# Patient Record
Sex: Female | Born: 1935 | Race: White | Hispanic: No | Smoking: Never smoker
Health system: Southern US, Community
[De-identification: ages and names within clinical notes are randomized; demographics above are authoritative.]

## PROBLEM LIST (undated history)

## (undated) DIAGNOSIS — I10 Essential (primary) hypertension: Secondary | ICD-10-CM

## (undated) DIAGNOSIS — E785 Hyperlipidemia, unspecified: Secondary | ICD-10-CM

## (undated) DIAGNOSIS — I4891 Unspecified atrial fibrillation: Secondary | ICD-10-CM

## (undated) DIAGNOSIS — E559 Vitamin D deficiency, unspecified: Secondary | ICD-10-CM

## (undated) DIAGNOSIS — K219 Gastro-esophageal reflux disease without esophagitis: Secondary | ICD-10-CM

## (undated) DIAGNOSIS — N189 Chronic kidney disease, unspecified: Secondary | ICD-10-CM

## (undated) DIAGNOSIS — F32A Depression, unspecified: Secondary | ICD-10-CM

## (undated) DIAGNOSIS — F329 Major depressive disorder, single episode, unspecified: Secondary | ICD-10-CM

## (undated) DIAGNOSIS — F0390 Unspecified dementia without behavioral disturbance: Secondary | ICD-10-CM

## (undated) HISTORY — DX: Vitamin D deficiency, unspecified: E55.9

## (undated) HISTORY — DX: Hyperlipidemia, unspecified: E78.5

## (undated) HISTORY — DX: Chronic kidney disease, unspecified: N18.9

## (undated) HISTORY — DX: Depression, unspecified: F32.A

## (undated) HISTORY — DX: Unspecified atrial fibrillation: I48.91

## (undated) HISTORY — PX: KIDNEY DONATION: SHX685

## (undated) HISTORY — DX: Major depressive disorder, single episode, unspecified: F32.9

## (undated) HISTORY — PX: CATARACT EXTRACTION: SUR2

---

## 2008-09-25 ENCOUNTER — Inpatient Hospital Stay: Payer: Self-pay | Admitting: Internal Medicine

## 2009-01-24 ENCOUNTER — Emergency Department: Payer: Self-pay | Admitting: Emergency Medicine

## 2009-05-25 ENCOUNTER — Ambulatory Visit: Payer: Self-pay | Admitting: Cardiovascular Disease

## 2009-12-06 ENCOUNTER — Emergency Department: Payer: Self-pay | Admitting: Emergency Medicine

## 2010-05-02 ENCOUNTER — Emergency Department: Payer: Self-pay | Admitting: Emergency Medicine

## 2010-07-31 ENCOUNTER — Ambulatory Visit: Payer: Self-pay | Admitting: Ophthalmology

## 2010-07-31 DIAGNOSIS — I119 Hypertensive heart disease without heart failure: Secondary | ICD-10-CM

## 2010-08-12 ENCOUNTER — Ambulatory Visit: Payer: Self-pay | Admitting: Ophthalmology

## 2010-08-13 ENCOUNTER — Ambulatory Visit: Payer: Self-pay | Admitting: Ophthalmology

## 2010-12-12 ENCOUNTER — Ambulatory Visit: Payer: Self-pay | Admitting: Ophthalmology

## 2011-01-17 ENCOUNTER — Ambulatory Visit: Payer: Self-pay | Admitting: Ophthalmology

## 2011-06-03 ENCOUNTER — Ambulatory Visit: Payer: Self-pay | Admitting: Ophthalmology

## 2013-06-03 ENCOUNTER — Ambulatory Visit: Payer: Self-pay | Admitting: Family

## 2013-08-01 ENCOUNTER — Ambulatory Visit: Payer: Self-pay | Admitting: Family

## 2014-08-13 NOTE — Op Note (Signed)
PATIENT NAME:  Rebecca HarriesDAYE, BERTIE B MR#:  [id number] DATE OF BIRTH:  06-02-1928  DATE OF PROCEDURE:  06/03/2011  PREOPERATIVE DIAGNOSIS: Visually significant cataract of the left eye.   POSTOPERATIVE DIAGNOSIS: Visually significant cataract of the left eye.   OPERATIVE PROCEDURE: Cataract extraction by phacoemulsification with implant of intraocular lens to the left eye.   SURGEON: Galen ManilaWilliam Jaice Lague, MD  ANESTHESIA:  1. Managed anesthesia care.  2. 50-50 mixture of 0.75% bupivacaine and 4% Xylocaine given as a retrobulbar block.   COMPLICATIONS: None.   TECHNIQUE:  Four quadrant divide-and-conquer.   DESCRIPTION OF PROCEDURE: The patient was examined and consented for this procedure in the preoperative holding area and then brought back to the Operating Room where the anesthesia team employed managed anesthesia care.  3.5 milliliters of the aforementioned mixture were placed in the left orbit on an Atkinson needle without complication. The left eye was then prepped and draped in the usual sterile ophthalmic fashion. A lid speculum was placed. The side-port blade was used to create a paracentesis and the anterior chamber was filled with viscoelastic. The keratome was used to create a near clear corneal incision. The continuous curvilinear capsulorrhexis was performed with a cystotome followed by the capsulorrhexis forceps. Hydrodissection and hydrodelineation were carried out with BSS on a blunt cannula. The lens was removed in a four quadrant divide-and-conquer technique. The remaining cortical material was removed with the irrigation-aspiration handpiece. The capsular bag was inflated with viscoelastic and the Alcon SN60WF 22.5-diopter lens, serial number [id number] was placed in the capsular bag without complication. The remaining viscoelastic was removed from the eye with the irrigation-aspiration handpiece. The wounds were hydrated. The anterior chamber was flushed with Miostat. The eye was inflated  to a physiologic pressure and the wounds were found to be water tight. The eye was dressed with Vigamox followed by Maxitrol ointment and a protective shield was placed. The patient will followup with me in one day.   NOTE: The Earl GalaMilligan ring was used to expand the pupil for adequate visualization during this procedure. The ChokioMilligan ring was removed at the end of the case.   ____________________________ Jerilee FieldWilliam L. Ned Kakar, MD wlp:cms D: 06/03/2011 21:52:21 ET T: 06/04/2011 09:19:23 ET JOB#: [id number]  cc: Lisaanne Lawrie L. Harold Moncus, MD, <Dictator>  Jerilee FieldWILLIAM L Shadaya Marschner MD ELECTRONICALLY SIGNED 06/10/2011 12:26

## 2014-12-19 ENCOUNTER — Emergency Department
Admission: EM | Admit: 2014-12-19 | Discharge: 2014-12-19 | Discharge status: Against Medical Advice | Payer: Medicare (Managed Care) | Attending: Emergency Medicine | Admitting: Emergency Medicine

## 2014-12-19 ENCOUNTER — Encounter: Payer: Self-pay | Admitting: Emergency Medicine

## 2014-12-19 DIAGNOSIS — I10 Essential (primary) hypertension: Secondary | ICD-10-CM | POA: Diagnosis not present

## 2014-12-19 DIAGNOSIS — R0602 Shortness of breath: Secondary | ICD-10-CM | POA: Insufficient documentation

## 2014-12-19 HISTORY — DX: Essential (primary) hypertension: I10

## 2014-12-19 HISTORY — DX: Gastro-esophageal reflux disease without esophagitis: K21.9

## 2014-12-19 HISTORY — DX: Unspecified dementia, unspecified severity, without behavioral disturbance, psychotic disturbance, mood disturbance, and anxiety: F03.90

## 2014-12-19 NOTE — ED Notes (Signed)
Pt brought in by granddaughter with c/o  Shortness of breath  For a couple of months.

## 2014-12-22 ENCOUNTER — Telehealth: Payer: Self-pay | Admitting: Emergency Medicine

## 2014-12-22 NOTE — ED Notes (Signed)
Called patient due to lwot to inquire about condition and follow up plans. Left message with my number. 

## 2015-02-18 ENCOUNTER — Observation Stay
Admission: EM | Admit: 2015-02-18 | Discharge: 2015-02-19 | Disposition: A | Discharge status: Home / Self Care | Payer: Medicare (Managed Care) | Attending: Internal Medicine | Admitting: Internal Medicine

## 2015-02-18 ENCOUNTER — Emergency Department: Payer: Medicare (Managed Care)

## 2015-02-18 DIAGNOSIS — Z905 Acquired absence of kidney: Secondary | ICD-10-CM | POA: Diagnosis not present

## 2015-02-18 DIAGNOSIS — Z9849 Cataract extraction status, unspecified eye: Secondary | ICD-10-CM | POA: Insufficient documentation

## 2015-02-18 DIAGNOSIS — Z888 Allergy status to other drugs, medicaments and biological substances status: Secondary | ICD-10-CM | POA: Diagnosis not present

## 2015-02-18 DIAGNOSIS — Z79899 Other long term (current) drug therapy: Secondary | ICD-10-CM | POA: Insufficient documentation

## 2015-02-18 DIAGNOSIS — R002 Palpitations: Secondary | ICD-10-CM | POA: Diagnosis not present

## 2015-02-18 DIAGNOSIS — I4891 Unspecified atrial fibrillation: Principal | ICD-10-CM

## 2015-02-18 DIAGNOSIS — K219 Gastro-esophageal reflux disease without esophagitis: Secondary | ICD-10-CM | POA: Insufficient documentation

## 2015-02-18 DIAGNOSIS — Z7982 Long term (current) use of aspirin: Secondary | ICD-10-CM | POA: Diagnosis not present

## 2015-02-18 DIAGNOSIS — I119 Hypertensive heart disease without heart failure: Secondary | ICD-10-CM | POA: Insufficient documentation

## 2015-02-18 DIAGNOSIS — F0390 Unspecified dementia without behavioral disturbance: Secondary | ICD-10-CM | POA: Insufficient documentation

## 2015-02-18 DIAGNOSIS — N390 Urinary tract infection, site not specified: Secondary | ICD-10-CM

## 2015-02-18 DIAGNOSIS — E785 Hyperlipidemia, unspecified: Secondary | ICD-10-CM | POA: Diagnosis not present

## 2015-02-18 DIAGNOSIS — R000 Tachycardia, unspecified: Secondary | ICD-10-CM | POA: Diagnosis not present

## 2015-02-18 LAB — COMPREHENSIVE METABOLIC PANEL
ALT: 8 U/L — ABNORMAL LOW (ref 14–54)
ANION GAP: 10 (ref 5–15)
AST: 21 U/L (ref 15–41)
Albumin: 3.8 g/dL (ref 3.5–5.0)
Alkaline Phosphatase: 42 U/L (ref 38–126)
BUN: 16 mg/dL (ref 6–20)
CHLORIDE: 109 mmol/L (ref 101–111)
CO2: 21 mmol/L — ABNORMAL LOW (ref 22–32)
Calcium: 8.8 mg/dL — ABNORMAL LOW (ref 8.9–10.3)
Creatinine, Ser: 1 mg/dL (ref 0.44–1.00)
GFR, EST AFRICAN AMERICAN: 58 mL/min — AB (ref >60)
GFR, EST NON AFRICAN AMERICAN: 50 mL/min — AB (ref >60)
Glucose, Bld: 96 mg/dL (ref 65–99)
POTASSIUM: 3.9 mmol/L (ref 3.5–5.1)
Sodium: 140 mmol/L (ref 135–145)
Total Bilirubin: 0.5 mg/dL (ref 0.3–1.2)
Total Protein: 7.1 g/dL (ref 6.5–8.1)

## 2015-02-18 LAB — TSH: TSH: 4.06 u[IU]/mL (ref 0.350–4.500)

## 2015-02-18 LAB — CBC WITH DIFFERENTIAL/PLATELET
BASOS ABS: 0.1 10*3/uL (ref 0–0.1)
Basophils Relative: 1 %
EOS PCT: 1 %
Eosinophils Absolute: 0.1 10*3/uL (ref 0–0.7)
HCT: 38.9 % (ref 35.0–47.0)
Hemoglobin: 12.9 g/dL (ref 12.0–16.0)
LYMPHS PCT: 42 %
Lymphs Abs: 4.7 10*3/uL — ABNORMAL HIGH (ref 1.0–3.6)
MCH: 30.9 pg (ref 26.0–34.0)
MCHC: 33.1 g/dL (ref 32.0–36.0)
MCV: 93.3 fL (ref 80.0–100.0)
MONO ABS: 0.8 10*3/uL (ref 0.2–0.9)
Monocytes Relative: 7 %
Neutro Abs: 5.5 10*3/uL (ref 1.4–6.5)
Neutrophils Relative %: 49 %
PLATELETS: 260 10*3/uL (ref 150–440)
RBC: 4.17 MIL/uL (ref 3.80–5.20)
RDW: 14.1 % (ref 11.5–14.5)
WBC: 11.2 10*3/uL — ABNORMAL HIGH (ref 3.6–11.0)

## 2015-02-18 LAB — URINALYSIS COMPLETE WITH MICROSCOPIC (ARMC ONLY)
BILIRUBIN URINE: NEGATIVE
GLUCOSE, UA: NEGATIVE mg/dL
Ketones, ur: NEGATIVE mg/dL
NITRITE: NEGATIVE
Protein, ur: NEGATIVE mg/dL
SPECIFIC GRAVITY, URINE: 1.005 (ref 1.005–1.030)
pH: 6 (ref 5.0–8.0)

## 2015-02-18 LAB — TROPONIN I
Troponin I: <0.03 ng/mL (ref <0.031)
Troponin I: <0.03 ng/mL (ref <0.031)

## 2015-02-18 LAB — T4, FREE: FREE T4: 0.99 ng/dL (ref 0.61–1.12)

## 2015-02-18 MED ORDER — SODIUM CHLORIDE 0.9 % IJ SOLN: 3.0000 mL | INTRAMUSCULAR | Status: DC | PRN

## 2015-02-18 MED ORDER — PANTOPRAZOLE SODIUM 40 MG PO TBEC: 40.0000 mg | DELAYED_RELEASE_TABLET | Freq: Every day | ORAL | Status: DC

## 2015-02-18 MED ORDER — LORAZEPAM 2 MG/ML IJ SOLN
INTRAMUSCULAR | Status: AC
  Filled 2015-02-18: qty 1

## 2015-02-18 MED ORDER — ISOSORBIDE MONONITRATE ER 30 MG PO TB24
30.0000 mg | ORAL_TABLET | Freq: Every day | ORAL | Status: DC
  Administered 2015-02-19: 30 mg via ORAL
  Filled 2015-02-18: qty 1

## 2015-02-18 MED ORDER — ACETAMINOPHEN 325 MG PO TABS: 650.0000 mg | ORAL_TABLET | Freq: Four times a day (QID) | ORAL | Status: DC | PRN

## 2015-02-18 MED ORDER — SODIUM CHLORIDE 0.9 % IJ SOLN: 3.0000 mL | Freq: Two times a day (BID) | INTRAMUSCULAR | Status: DC

## 2015-02-18 MED ORDER — HALOPERIDOL LACTATE 5 MG/ML IJ SOLN: 5.0000 mg | Freq: Once | INTRAMUSCULAR | Status: DC

## 2015-02-18 MED ORDER — DILTIAZEM HCL 25 MG/5ML IV SOLN
10.0000 mg | Freq: Once | INTRAVENOUS | Status: AC
  Administered 2015-02-18: 10 mg via INTRAVENOUS

## 2015-02-18 MED ORDER — LORAZEPAM 2 MG/ML IJ SOLN: 1.0000 mg | Freq: Once | INTRAMUSCULAR | Status: DC

## 2015-02-18 MED ORDER — DILTIAZEM HCL 25 MG/5ML IV SOLN: 10.0000 mg | INTRAVENOUS | Status: DC | PRN

## 2015-02-18 MED ORDER — DILTIAZEM HCL 30 MG PO TABS
60.0000 mg | ORAL_TABLET | Freq: Once | ORAL | Status: AC
  Administered 2015-02-18: 60 mg via ORAL
  Filled 2015-02-18: qty 2

## 2015-02-18 MED ORDER — ONDANSETRON HCL 4 MG PO TABS: 4.0000 mg | ORAL_TABLET | Freq: Four times a day (QID) | ORAL | Status: DC | PRN

## 2015-02-18 MED ORDER — HALOPERIDOL LACTATE 5 MG/ML IJ SOLN
INTRAMUSCULAR | Status: AC
  Filled 2015-02-18: qty 1

## 2015-02-18 MED ORDER — POLYETHYLENE GLYCOL 3350 17 G PO PACK: 17.0000 g | PACK | Freq: Every day | ORAL | Status: DC | PRN

## 2015-02-18 MED ORDER — SIMVASTATIN 20 MG PO TABS
20.0000 mg | ORAL_TABLET | Freq: Every day | ORAL | Status: DC
  Administered 2015-02-18: 20 mg via ORAL
  Filled 2015-02-18: qty 1

## 2015-02-18 MED ORDER — ASPIRIN EC 81 MG PO TBEC
81.0000 mg | DELAYED_RELEASE_TABLET | Freq: Every day | ORAL | Status: DC
  Administered 2015-02-19: 81 mg via ORAL
  Filled 2015-02-18: qty 1

## 2015-02-18 MED ORDER — APIXABAN 2.5 MG PO TABS
2.5000 mg | ORAL_TABLET | Freq: Two times a day (BID) | ORAL | Status: DC
  Administered 2015-02-18 – 2015-02-19 (×2): 2.5 mg via ORAL
  Filled 2015-02-18 (×2): qty 1

## 2015-02-18 MED ORDER — DEXTROSE 5 % IV SOLN
1.0000 g | Freq: Once | INTRAVENOUS | Status: AC
  Administered 2015-02-18: 1 g via INTRAVENOUS
  Filled 2015-02-18: qty 10

## 2015-02-18 MED ORDER — ACETAMINOPHEN 650 MG RE SUPP: 650.0000 mg | Freq: Four times a day (QID) | RECTAL | Status: DC | PRN

## 2015-02-18 MED ORDER — DILTIAZEM HCL 30 MG PO TABS
30.0000 mg | ORAL_TABLET | Freq: Four times a day (QID) | ORAL | Status: DC
  Administered 2015-02-18: 30 mg via ORAL
  Filled 2015-02-18: qty 1

## 2015-02-18 MED ORDER — DILTIAZEM HCL 25 MG/5ML IV SOLN
10.0000 mg | Freq: Once | INTRAVENOUS | Status: AC
Start: 2015-02-18 — End: 2015-02-18
  Administered 2015-02-18: 10 mg via INTRAVENOUS

## 2015-02-18 MED ORDER — ALBUTEROL SULFATE (2.5 MG/3ML) 0.083% IN NEBU: 2.5000 mg | INHALATION_SOLUTION | RESPIRATORY_TRACT | Status: DC | PRN

## 2015-02-18 MED ORDER — DEXTROSE 5 % IV SOLN
1.0000 g | INTRAVENOUS | Status: DC
  Filled 2015-02-18: qty 10

## 2015-02-18 MED ORDER — SODIUM CHLORIDE 0.9 % IV SOLN: 250.0000 mL | INTRAVENOUS | Status: DC | PRN

## 2015-02-18 MED ORDER — SOTALOL HCL 80 MG PO TABS
80.0000 mg | ORAL_TABLET | Freq: Two times a day (BID) | ORAL | Status: DC
  Administered 2015-02-19: 80 mg via ORAL
  Filled 2015-02-18: qty 1

## 2015-02-18 MED ORDER — SODIUM CHLORIDE 0.9 % IV BOLUS (SEPSIS)
500.0000 mL | Freq: Once | INTRAVENOUS | Status: AC
  Administered 2015-02-18: 500 mL via INTRAVENOUS

## 2015-02-18 MED ORDER — DILTIAZEM HCL 25 MG/5ML IV SOLN: 15.0000 mg | Freq: Once | INTRAVENOUS | Status: DC

## 2015-02-18 MED ORDER — SENNOSIDES-DOCUSATE SODIUM 8.6-50 MG PO TABS
2.0000 | ORAL_TABLET | Freq: Every day | ORAL | Status: DC
  Administered 2015-02-18: 2 via ORAL
  Filled 2015-02-18: qty 2

## 2015-02-18 MED ORDER — ONDANSETRON HCL 4 MG/2ML IJ SOLN: 4.0000 mg | Freq: Four times a day (QID) | INTRAMUSCULAR | Status: DC | PRN

## 2015-02-18 MED ORDER — DILTIAZEM HCL 25 MG/5ML IV SOLN
INTRAVENOUS | Status: AC
  Filled 2015-02-18: qty 5

## 2015-02-18 MED ORDER — CITALOPRAM HYDROBROMIDE 20 MG PO TABS
20.0000 mg | ORAL_TABLET | Freq: Every day | ORAL | Status: DC
  Administered 2015-02-19: 20 mg via ORAL
  Filled 2015-02-18: qty 1

## 2015-02-18 MED ORDER — SODIUM CHLORIDE 0.9 % IJ SOLN
3.0000 mL | Freq: Two times a day (BID) | INTRAMUSCULAR | Status: DC
  Administered 2015-02-18: 3 mL via INTRAVENOUS

## 2015-02-18 NOTE — ED Notes (Signed)
Pt confused, reoriented to stay in bed.

## 2015-02-18 NOTE — Progress Notes (Signed)
Informed Dr. Anne HahnWillis that patient HR was currently 48. Patient has sotalol 80mg  and  Diltiazem 30.Dr. Anne HahnWillis informed nurse to hold diltiazem 30mg , and to call the Dr. Claiborne Billingshat was on call for Dr. Welton FlakesKhan about the sotalol 80.  Paged Dr. Welton FlakesKHAN, awaiting response.

## 2015-02-18 NOTE — ED Notes (Signed)
Pt family brought patient because of fast HR. Pt hx of dementia. Pt ambulatory, has to be reoriented every few minutes.

## 2015-02-18 NOTE — Progress Notes (Signed)
Rebecca Webb is a 81 y.o. female  [id number]  Primary Cardiologist: shaukat khan  Reason for Consultation: atrial fibrillation  HPI: 35 YOBF came with palpitation, and SOB but no chest pain. Patient had afib with RVR.     Past Medical History  Diagnosis Date  . Hypertension   . GERD (gastroesophageal reflux disease)   . Dementia      (Not in a hospital admission)   . apixaban  2.5 mg Oral BID  . diltiazem  30 mg Oral 4 times per day  . sodium chloride  3 mL Intravenous Q12H  . sodium chloride  3 mL Intravenous Q12H  . sotalol  80 mg Oral Q12H    Infusions: . sodium chloride    . [START ON 02/19/2015] cefTRIAXone (ROCEPHIN)  IV    . sodium chloride      Allergies  Allergen Reactions  . Lisinopril Swelling    Tongue and lips was swollen.    Social History   Social History  . Marital Status: Single    Spouse Name: N/A  . Number of Children: N/A  . Years of Education: N/A   Occupational History  . Not on file.   Social History Main Topics  . Smoking status: Never Smoker   . Smokeless tobacco: Not on file  . Alcohol Use: No  . Drug Use: Not on file  . Sexual Activity: Not on file   Other Topics Concern  . Not on file   Social History Narrative    No family history on file.  PHYSICAL EXAM: Filed Vitals:   02/18/15 1900  BP: 127/99  Pulse: 136  Temp:   Resp: 20    No intake or output data in the 24 hours ending 02/18/15 2028  General:  Well appearing. No respiratory difficulty HEENT: normal Neck: supple. no JVD. Carotids 2+ bilat; no bruits. No lymphadenopathy or thryomegaly appreciated. Cor: PMI nondisplaced. Irregularly irregular rate & rhythm. No rubs, gallops or murmurs. Lungs: clear Abdomen: soft, nontender, nondistended. No hepatosplenomegaly. No bruits or masses. Good bowel sounds. Extremities: no cyanosis, clubbing, rash, edema Neuro: alert & oriented x 3, cranial nerves grossly intact. moves all 4 extremities w/o difficulty.  Affect pleasant.  ECG: AFIB with RVR non-specific st and t changes  Results for orders placed or performed during the hospital encounter of 02/18/15 (from the past 24 hour(s))  CBC with Differential     Status: Abnormal   Collection Time: 02/18/15  5:26 PM  Result Value Ref Range   WBC 11.2 (H) 3.6 - 11.0 K/uL   RBC 4.17 3.80 - 5.20 MIL/uL   Hemoglobin 12.9 12.0 - 16.0 g/dL   HCT 40.9 81.1 - 91.4 %   MCV 93.3 80.0 - 100.0 fL   MCH 30.9 26.0 - 34.0 pg   MCHC 33.1 32.0 - 36.0 g/dL   RDW 78.2 95.6 - 21.3 %   Platelets 260 150 - 440 K/uL   Neutrophils Relative % 49 %   Neutro Abs 5.5 1.4 - 6.5 K/uL   Lymphocytes Relative 42 %   Lymphs Abs 4.7 (H) 1.0 - 3.6 K/uL   Monocytes Relative 7 %   Monocytes Absolute 0.8 0.2 - 0.9 K/uL   Eosinophils Relative 1 %   Eosinophils Absolute 0.1 0 - 0.7 K/uL   Basophils Relative 1 %   Basophils Absolute 0.1 0 - 0.1 K/uL  Comprehensive metabolic panel     Status: Abnormal   Collection Time: 02/18/15  5:26  PM  Result Value Ref Range   Sodium 140 135 - 145 mmol/L   Potassium 3.9 3.5 - 5.1 mmol/L   Chloride 109 101 - 111 mmol/L   CO2 21 (L) 22 - 32 mmol/L   Glucose, Bld 96 65 - 99 mg/dL   BUN 16 6 - 20 mg/dL   Creatinine, Ser 1.611.00 0.44 - 1.00 mg/dL   Calcium 8.8 (L) 8.9 - 10.3 mg/dL   Total Protein 7.1 6.5 - 8.1 g/dL   Albumin 3.8 3.5 - 5.0 g/dL   AST 21 15 - 41 U/L   ALT 8 (L) 14 - 54 U/L   Alkaline Phosphatase 42 38 - 126 U/L   Total Bilirubin 0.5 0.3 - 1.2 mg/dL   GFR calc non Af Amer 50 (L) >60 mL/min   GFR calc Af Amer 58 (L) >60 mL/min   Anion gap 10 5 - 15  Troponin I     Status: None   Collection Time: 02/18/15  5:26 PM  Result Value Ref Range   Troponin I <0.03 <0.031 ng/mL  TSH     Status: None   Collection Time: 02/18/15  5:26 PM  Result Value Ref Range   TSH 4.060 0.350 - 4.500 uIU/mL  T4, free     Status: None   Collection Time: 02/18/15  5:26 PM  Result Value Ref Range   Free T4 0.99 0.61 - 1.12 ng/dL  Urinalysis  complete, with microscopic ([facility] only)     Status: Abnormal   Collection Time: 02/18/15  5:36 PM  Result Value Ref Range   Color, Urine YELLOW (A) YELLOW   APPearance HAZY (A) CLEAR   Glucose, UA NEGATIVE NEGATIVE mg/dL   Bilirubin Urine NEGATIVE NEGATIVE   Ketones, ur NEGATIVE NEGATIVE mg/dL   Specific Gravity, Urine 1.005 1.005 - 1.030   Hgb urine dipstick 1+ (A) NEGATIVE   pH 6.0 5.0 - 8.0   Protein, ur NEGATIVE NEGATIVE mg/dL   Nitrite NEGATIVE NEGATIVE   Leukocytes, UA 3+ (A) NEGATIVE   RBC / HPF 6-30 0 - 5 RBC/hpf   WBC, UA TOO NUMEROUS TO COUNT 0 - 5 WBC/hpf   Bacteria, UA FEW (A) NONE SEEN   Squamous Epithelial / LPF 6-30 (A) NONE SEEN   Mucous PRESENT    Dg Chest Portable 1 View  02/18/2015  CLINICAL DATA:  Tachycardia, dementia. EXAM: PORTABLE CHEST 1 VIEW COMPARISON:  Chest x-ray dated 01/24/2009 FINDINGS: Heart size is upper normal. Overall cardiomediastinal silhouette is stable in size and configuration. Lungs appear clear. No evidence of pneumonia seen. No pleural effusion. No acute osseous abnormality. IMPRESSION: No evidence of acute cardiopulmonary abnormality. Electronically Signed   By: Bary RichardStan  Maynard M.D.   On: 02/18/2015 18:09     ASSESSMENT AND PLAN: Afib with RVR and [place] 2, advise sotolol 80 bid and anticoagulation.  KHAN,SHAUKAT A

## 2015-02-18 NOTE — H&P (Signed)
[facility] [place] [facility]   PATIENT NAME: Rebecca Webb    MR#:  [id number]  DATE OF BIRTH:  1928-05-20  DATE OF ADMISSION:  02/18/2015  PRIMARY CARE PHYSICIAN: Delton PrairieBROOKS, KEATAH B, FNP   REQUESTING/REFERRING PHYSICIAN: Dr. Inocencio HomesGayle  CHIEF COMPLAINT:   Chief Complaint  Patient presents with  . Tachycardia    HISTORY OF PRESENT ILLNESS:  Rebecca Webb  is a 81 y.o. female with a known history of hypertension, dementia, GERD, hyperlipidemia presents to the emergency room brought in by her granddaughter due to elevated heart rate. Patient was found to have atrial fibrillation 160 received a dose of IV Cardizem which improved her heart rate. Also received a dose of 60 mg oral Cardizem. But heartrate has returned into 130s at this time. History has been obtained from granddaughter at bedside and reviewing old records. Patient due to her advanced dementia is pleasantly confused and unable to contribution to history. Overall daughter mentions that patient has been at baseline. No history of atrial fibrillation.  PAST MEDICAL HISTORY:   Past Medical History  Diagnosis Date  . Hypertension   . GERD (gastroesophageal reflux disease)   . Dementia     PAST SURGICAL HISTORY:   Past Surgical History  Procedure Laterality Date  . Cataract extraction    . Kidney donation      SOCIAL HISTORY:   Social History  Substance Use Topics  . Smoking status: Never Smoker   . Smokeless tobacco: Not on file  . Alcohol Use: No    FAMILY HISTORY:  No family history on file.  DRUG ALLERGIES:  No Known Allergies  REVIEW OF SYSTEMS:   Review of Systems  Unable to perform ROS: dementia    MEDICATIONS AT HOME:   Prior to Admission medications   Not on File      VITAL SIGNS:  Blood pressure 127/99, pulse 136, temperature 98.3 F (36.8 C), temperature source Oral, resp. rate 20, height 5\' 3"  (1.6 m), weight 58.968 kg (130 lb), SpO2 98 %.  PHYSICAL  EXAMINATION:  Physical Exam  GENERAL:  81 y.o.-year-old patient lying in the bed with no acute distress.  EYES: Pupils equal, round, reactive to light and accommodation. No scleral icterus. Extraocular muscles intact.  HEENT: Head atraumatic, normocephalic. Oropharynx and nasopharynx clear. No oropharyngeal erythema, moist oral mucosa  NECK:  S1 and S2 heard. Irregularly irregular. LUNGS: Normal breath sounds bilaterally, no wheezing, rales, rhonchi. No use of accessory muscles of respiration.  CARDIOVASCULAR: S1, S2 normal. No murmurs, rubs, or gallops.  ABDOMEN: Soft, nontender, nondistended. Bowel sounds present. No organomegaly or mass.  EXTREMITIES: No pedal edema, cyanosis, or clubbing. + 2 pedal & radial pulses b/l.   NEUROLOGIC: Cranial nerves II through XII are intact. No focal Motor or sensory deficits appreciated b/l PSYCHIATRIC: The patient is alert and awake. Pleasantly confused SKIN: No obvious rash, lesion, or ulcer.   LABORATORY PANEL:   CBC  Recent Labs Lab 02/18/15 1726  WBC 11.2*  HGB 12.9  HCT 38.9  PLT 260   ------------------------------------------------------------------------------------------------------------------  Chemistries  No results for input(s): NA, K, CL, CO2, GLUCOSE, BUN, CREATININE, CALCIUM, MG, AST, ALT, ALKPHOS, BILITOT in the last 168 hours.  Invalid input(s): GFRCGP ------------------------------------------------------------------------------------------------------------------  Cardiac Enzymes No results for input(s): TROPONINI in the last 168 hours. ------------------------------------------------------------------------------------------------------------------  RADIOLOGY:  Dg Chest Portable 1 View  02/18/2015  CLINICAL DATA:  Tachycardia, dementia. EXAM: PORTABLE CHEST 1 VIEW COMPARISON:  Chest x-ray dated 01/24/2009 FINDINGS:  Heart size is upper normal. Overall cardiomediastinal silhouette is stable in size and configuration.  Lungs appear clear. No evidence of pneumonia seen. No pleural effusion. No acute osseous abnormality. IMPRESSION: No evidence of acute cardiopulmonary abnormality. Electronically Signed   By: Bary Richard M.D.   On: 02/18/2015 18:09     IMPRESSION AND PLAN:   * New onset atrial fibrillation with rapid ventricular rate Start patient on sotalol 80 mg twice a day. And Eliquis 2.5 mg twice a day as advised by Dr. Welton Flakes of cardiology. We will also add IV Cardizem as needed for rapid ventricular rate. Telemetry. Echocardiogram. Repeat troponin.  TSH.  * UTI Start ceftriaxone and sent for urine cultures.  * Hypertension We will stop Toprol as patient is on sotalol at this time.   * Dementia Watch for inpatient delirium  * DVT prophylaxis patient is on full dose Eliquis.   All the records are reviewed and case discussed with ED provider. Management plans discussed with the patient, family and they are in agreement.  CODE STATUS: FULL  TOTAL TIME TAKING CARE OF THIS PATIENT: 40 minutes.    Milagros Loll R M.D on 02/18/2015 at 7:57 PM  Between 7am to 6pm - Pager - [phone]  After 6pm go to [email or web address] - password [email or web address] [facility]  [place] [facility]  Office  [phone]  CC: Primary care physician; Delton Prairie, FNP     Note: This dictation was prepared with Dragon dictation along with smaller phrase technology. Any transcriptional errors that result from this process are unintentional.

## 2015-02-18 NOTE — ED Notes (Addendum)
Granddaughter reports pt has a rapid heart rate for past month. Heart of 111 per family. Denies any other complaint pt has dementia Pt with a rate of 160 in triage per EKG

## 2015-02-18 NOTE — ED Provider Notes (Signed)
[facility] Emergency Department Provider Note  ____________________________________________  Time seen: Approximately 5:07 PM  I have reviewed the triage vital signs and the nursing notes.   HISTORY  Chief Complaint Tachycardia  Caveat-history of present illness and review of systems Limited due to the patient's dementia. All information obtained from her family members at bedside.  HPI Rebecca Webb is a 81 y.o. female history of dementia, hypertension, GERD presents for evaluation of increased heart rate intermittent over the past 1 month as noted by her caregivers. Caregivers noted that today he did see her pulse beating in her neck and it was quite rapid. Her breathing sometimes appears shallow but she has not complained of chest pain but when this happens to her she sometimes will get sleepy. She has been taking metoprolol recently which was started by her primary care doctor presumably for elevated heart rate however family reports that she has never been diagnosed with atrial fibrillation. She has not been ill recently including no cough, sneezing, runny nose, congestion, vomiting, diarrhea, fevers or chills.  Past Medical History  Diagnosis Date  . Hypertension   . GERD (gastroesophageal reflux disease)   . Dementia     There are no active problems to display for this patient.   Past Surgical History  Procedure Laterality Date  . Cataract extraction    . Kidney donation      No current outpatient prescriptions on file.  Allergies Review of patient's allergies indicates no known allergies.  No family history on file.  Social History Social History  Substance Use Topics  . Smoking status: Never Smoker   . Smokeless tobacco: None  . Alcohol Use: No    Review of Systems Constitutional: No fever/chills Cardiovascular: No complaint of chest pain. Gastrointestinal: No abdominal pain.  No nausea, no vomiting.  No diarrhea.  No  constipation. Neurological: Negative for focal weakness or numbness.    Caveat-history of present illness and review of systems Limited due to the patient's dementia. All information obtained from her family members at bedside. ____________________________________________   PHYSICAL EXAM:  VITAL SIGNS: ED Triage Vitals  Enc Vitals Group     BP 02/18/15 1655 131/91 mmHg     Pulse Rate 02/18/15 1655 147     Resp 02/18/15 1655 20     Temp 02/18/15 1655 98.3 F (36.8 C)     Temp Source 02/18/15 1655 Oral     SpO2 02/18/15 1655 99 %     Weight 02/18/15 1655 130 lb (58.968 kg)     Height 02/18/15 1655  (1.6 m)     Head Cir --      Peak Flow --      Pain Score --      Pain Loc --      Pain Edu? --      Excl. in GC? --     Constitutional: Alert and  demented, intermittently cooperative. Eyes: Conjunctivae are normal. PERRL. EOMI. Head: Atraumatic. Nose: No congestion/rhinnorhea. Mouth/Throat: Mucous membranes are moist.  Oropharynx non-erythematous. Neck: No stridor.  Cardiovascular: tachycardic rate, irregular rhythm. Grossly normal heart sounds.  Good peripheral circulation. Respiratory: Normal respiratory effort.  No retractions. Lungs CTAB. Gastrointestinal: Soft and nontender. No distention.  No CVA tenderness. Genitourinary: deferred Musculoskeletal: No lower extremity tenderness nor edema.  No joint effusions. Neurologic:  Normal speech and language. No gross focal neurologic deficits are appreciated. No gait instability. Skin:  Skin is warm, dry and intact. No rash noted.  Psychiatric: Mood and affect are normal. Speech and behavior are normal.  ____________________________________________   LABS (all labs ordered are listed, but only abnormal results are displayed)  Labs Reviewed  CBC WITH DIFFERENTIAL/PLATELET - Abnormal; Notable for the following:    WBC 11.2 (*)    Lymphs Abs 4.7 (*)    All other components within normal limits  URINALYSIS COMPLETEWITH  MICROSCOPIC ([facility] ONLY) - Abnormal; Notable for the following:    Color, Urine YELLOW (*)    APPearance HAZY (*)    Hgb urine dipstick 1+ (*)    Leukocytes, UA 3+ (*)    Bacteria, UA FEW (*)    Squamous Epithelial / LPF 6-30 (*)    All other components within normal limits  COMPREHENSIVE METABOLIC PANEL  TROPONIN I  TSH  T4, FREE   ____________________________________________  EKG  ED ECG REPORT I, Gayla DossGayle, Sehaj Kolden A, the attending physician, personally viewed and interpreted this ECG.   Date: 02/18/2015  EKG Time: 16:55  Rate: 148  Rhythm: atrial fibrillation, rate 148  Axis: normal  Intervals:none  ST&T Change: No acute ST elevation. ST depression in V3 through V6.   ____________________________________________  RADIOLOGY  CXR  IMPRESSION: No evidence of acute cardiopulmonary abnormality.  ____________________________________________   PROCEDURES  Procedure(s) performed: None  Critical Care performed: Total critical care time 30 minutes.  ____________________________________________   INITIAL IMPRESSION / ASSESSMENT AND PLAN / ED COURSE  Pertinent labs & imaging results that were available during my care of the patient were reviewed by me and considered in my medical decision making (see chart for details).  Rebecca Webb is a 81 y.o. female history of dementia, hypertension, GERD presents for evaluation of increased heart rate intermittent over the past 1 month as noted by her caregivers. On arrival to the emergency department she is pleasant demented in no acute distress. She is noted to be in atrial fibrillation with a rapid ventricular rate and stable blood pressure. At this time rate has improved from the 160s to 111 with 10 mg of IV diltiazem as well as IV fluids, will give by mouth Cardizem. Screening labs pending as is chest x-ray. Initially was fighting/resisting IV placement however eventually complied and did not require any medications for chemical  restraint. Anticipate admission.   ----------------------------------------- 7:34 PM on 02/18/2015 -----------------------------------------  Patient has received a total of 20 mg IV diltiazem as well as 60 mg by mouth diltiazem with improvement of her heart rate to low 100s and she is maintaining adequate blood pressure. Her analysis concerning for urinary tract infection, we'll give ceftriaxone. CBC notable for mild leukocytosis. I discussed the remainder of her lab work with the lab and her CMP and her troponin are unremarkable. TSH and T4 not resulted. Chest x-ray clear. Case discussed with Dr. Elpidio AnisSudini, hospitalist for admission at this time. ____________________________________________   FINAL CLINICAL IMPRESSION(S) / ED DIAGNOSES  Final diagnoses:  Atrial fibrillation with rapid ventricular response (HCC)  UTI (lower urinary tract infection)      Gayla DossEryka A Jasmon Graffam, MD 02/18/15 1935

## 2015-02-19 ENCOUNTER — Observation Stay: Admit: 2015-02-19 | Payer: Medicare (Managed Care)

## 2015-02-19 LAB — TROPONIN I

## 2015-02-19 MED ORDER — SOTALOL HCL 80 MG PO TABS: 80.0000 mg | ORAL_TABLET | Freq: Two times a day (BID) | ORAL | Status: DC

## 2015-02-19 MED ORDER — APIXABAN 2.5 MG PO TABS: 2.5000 mg | ORAL_TABLET | Freq: Two times a day (BID) | ORAL | Status: DC

## 2015-02-19 MED ORDER — CEFUROXIME AXETIL 250 MG PO TABS
250.0000 mg | ORAL_TABLET | Freq: Two times a day (BID) | ORAL | Status: DC
Start: 2015-02-19 — End: 2016-01-08

## 2015-02-19 NOTE — Progress Notes (Signed)
SUBJECTIVE: Patient is feeling much better   Filed Vitals:   02/18/15 2124 02/18/15 2130 02/18/15 2219 02/19/15 0607  BP: 116/85 130/90 128/70 128/59  Pulse:  102 56 63  Temp:   97.5 F (36.4 C) 97.5 F (36.4 C)  TempSrc:   Oral   Resp:  20 16 16   Height:      Weight:   122 lb 8 oz (55.566 kg) 122 lb 8 oz (55.566 kg)  SpO2:  98% 100% 100%    Intake/Output Summary (Last 24 hours) at 02/19/15 0846 Last data filed at 02/19/15 0636  Gross per 24 hour  Intake      0 ml  Output      0 ml  Net      0 ml    LABS: Basic Metabolic Panel:  Recent Labs  16/01/9609/30/16 1726  NA 140  K 3.9  CL 109  CO2 21*  GLUCOSE 96  BUN 16  CREATININE 1.00  CALCIUM 8.8*   Liver Function Tests:  Recent Labs  02/18/15 1726  AST 21  ALT 8*  ALKPHOS 42  BILITOT 0.5  PROT 7.1  ALBUMIN 3.8   No results for input(s): LIPASE, AMYLASE in the last 72 hours. CBC:  Recent Labs  02/18/15 1726  WBC 11.2*  NEUTROABS 5.5  HGB 12.9  HCT 38.9  MCV 93.3  PLT 260   Cardiac Enzymes:  Recent Labs  02/18/15 1726 02/18/15 2137 02/19/15 0644  TROPONINI <0.03 <0.03 <0.03   BNP: Invalid input(s): POCBNP D-Dimer: No results for input(s): DDIMER in the last 72 hours. Hemoglobin A1C: No results for input(s): HGBA1C in the last 72 hours. Fasting Lipid Panel: No results for input(s): CHOL, HDL, LDLCALC, TRIG, CHOLHDL, LDLDIRECT in the last 72 hours. Thyroid Function Tests:  Recent Labs  02/18/15 1726  TSH 4.060   Anemia Panel: No results for input(s): VITAMINB12, FOLATE, FERRITIN, TIBC, IRON, RETICCTPCT in the last 72 hours.   PHYSICAL EXAM General: Well developed, well nourished, in no acute distress HEENT:  Normocephalic and atramatic Neck:  No JVD.  Lungs: Clear bilaterally to auscultation and percussion. Heart: HRRR . Normal S1 and S2 without gallops or murmurs.  Abdomen: Bowel sounds are positive, abdomen soft and non-tender  Msk:  Back normal, normal gait. Normal strength and  tone for age. Extremities: No clubbing, cyanosis or edema.   Neuro: Alert and oriented X 3. Psych:  Good affect, responds appropriately  TELEMETRY: Sinus rhythm 80 bpm  ASSESSMENT AND PLAN: Atrial fibrillation with rapid ventricular response rate last night, discontinue Cardizem continue sotalol 80 mg by mouth twice a day. Patient can be discharged with follow-up Thursday at 2:00.  Active Problems:   A-fib (HCC)    Laurier NancyKHAN,Vaness Jelinski A, MD, [facility] Intervention ServicesFACC 02/19/2015 8:46 AM

## 2015-02-19 NOTE — Plan of Care (Signed)
Problem: Consults Goal: Atrial Arhythmia Patient Education (See Patient Education module for education specifics.) Outcome: Progressing Handouts given to patient

## 2015-02-19 NOTE — Plan of Care (Signed)
Problem: Phase I Progression Outcomes Goal: Pain controlled with appropriate interventions Outcome: Completed/Met Date Met:  02/19/15 No c/o pain on my shift. Will continue to monitor.

## 2015-02-19 NOTE — Progress Notes (Signed)
Patient/family received discharge instructions, pt verbalized understanding. IV was removed with no signs of infection. Dressing clean, dry intact. No skin tears or wounds present. Prescription was printed and sent to pharmacy. Patient was escorted out with staff member via wheelchair via private auto. No further needs from care management team.

## 2015-02-19 NOTE — Progress Notes (Signed)
Unable to complete ECHO due to patient moving around. Dr. Juliene PinaMody has been made aware. MD stated patient is okay for d/c. Rudean Haskellonnisha R Joenathan Sakuma

## 2015-02-19 NOTE — Plan of Care (Signed)
Problem: Phase I Progression Outcomes Goal: Ventricular heart rate < 120/min Outcome: Progressing Patient's HR is currently 48bpm

## 2015-02-19 NOTE — Discharge Summary (Signed)
[facility] [place] [facility]   PATIENT NAME: Rebecca Webb    MR#:  [id number]  DATE OF BIRTH:  [birth year]  DATE OF ADMISSION:  02/18/2015 ADMITTING PHYSICIAN: Milagros LollSrikar Sudini, MD  DATE OF DISCHARGE: 02/19/2015 12:03 PM  PRIMARY CARE PHYSICIAN: Delton PrairieBROOKS, KEATAH B, FNP    ADMISSION DIAGNOSIS:  UTI (lower urinary tract infection) [N39.0] Atrial fibrillation with rapid ventricular response (HCC) [I48.91]  DISCHARGE DIAGNOSIS:  Active Problems:   A-fib (HCC)   SECONDARY DIAGNOSIS:   Past Medical History  Diagnosis Date  . Hypertension   . GERD (gastroesophageal reflux disease)   . Dementia     HOSPITAL COURSE:  81 y/o female admitted for new onset atrial fibrillation and now has converted NSR.  1.New onset atrial fibrillation with rapid ventricular rate Patient converted to normal sinus rhythm. She is started on sotalol 80 Miller's by mouth twice a day. She is also on [place] but due to low heart rates this was discontinued. 2-D echocardiogram could not be performed as the patient was moving too much. Patient will follow-up with cardiology on Thursday. She was also started on anticoagulation due to her risk factor for stroke with atrial fibrillation. She was provided side effects, alternatives and benefits of anticoagulation and her and her daughter who is power of attorney have decided to start anticoagulation.   * UTI Patient will be discharged on Ceftin.  * Hypertension: Blood pressure is acceptable.   * Dementia     DISCHARGE CONDITIONS AND DIET:  Patient is being discharged home in stable condition on a heart healthy diet  CONSULTS OBTAINED:  Treatment Team:  Laurier NancyShaukat A Khan, MD Adrian SaranSital Kelsye Loomer, MD  DRUG ALLERGIES:   Allergies  Allergen Reactions  . Lisinopril Swelling    Tongue and lips was swollen.    DISCHARGE MEDICATIONS:   Discharge Medication List as of 02/19/2015 11:23 AM    START taking these medications   Details  apixaban (ELIQUIS) 2.5 MG TABS tablet Take 1 tablet (2.5 mg total) by mouth 2 (two) times daily., Starting 02/19/2015, Until Discontinued, Normal    cefUROXime (CEFTIN) 250 MG tablet Take 1 tablet (250 mg total) by mouth 2 (two) times daily with a meal., Starting 02/19/2015, Until Discontinued, Normal    sotalol (BETAPACE) 80 MG tablet Take 1 tablet (80 mg total) by mouth every 12 (twelve) hours., Starting 02/19/2015, Until Discontinued, Normal      CONTINUE these medications which have NOT CHANGED   Details  citalopram (CELEXA) 20 MG tablet Take 20 mg by mouth daily., Until Discontinued, Historical Med    esomeprazole (NEXIUM) 40 MG capsule Take 40 mg by mouth daily., Until Discontinued, Historical Med    isosorbide mononitrate (IMDUR) 30 MG 24 hr tablet Take 30 mg by mouth daily., Until Discontinued, Historical Med    Sennosides-Docusate Sodium (SENEXON-S PO) Take 2 tablets by mouth daily., Until Discontinued, Historical Med    simvastatin (ZOCOR) 20 MG tablet Take 20 mg by mouth daily., Until Discontinued, Historical Med    Vitamin D, Ergocalciferol, (DRISDOL) 50000 UNITS CAPS capsule Take 50,000 Units by mouth every 30 (thirty) days., Until Discontinued, Historical Med      STOP taking these medications     aspirin EC 81 MG tablet      metoprolol succinate (TOPROL-XL) 25 MG 24 hr tablet               Today   CHIEF COMPLAINT:  Patient doing well this morning. Patient heart  rate is better controlled. Patient denies chest pain or nausea or dizziness.   VITAL SIGNS:  Blood pressure 128/72, pulse 60, temperature 97.5 F (36.4 C), temperature source Oral, resp. rate 16, height  (1.6 m), weight 55.566 kg (122 lb 8 oz), SpO2 100 %.   REVIEW OF SYSTEMS:  Review of Systems  Constitutional: Negative for fever, chills and malaise/fatigue.  HENT: Negative for sore throat.   Eyes: Negative for blurred vision.  Respiratory: Negative for cough, hemoptysis,  shortness of breath and wheezing.   Cardiovascular: Negative for chest pain, palpitations and leg swelling.  Gastrointestinal: Negative for nausea, vomiting, abdominal pain, diarrhea and blood in stool.  Genitourinary: Negative for dysuria.  Musculoskeletal: Negative for back pain.  Neurological: Negative for dizziness, tremors and headaches.  Endo/Heme/Allergies: Does not bruise/bleed easily.     PHYSICAL EXAMINATION:  GENERAL:  81 y.o.-year-old patient lying in the bed with no acute distress.  NECK:  Supple, no jugular venous distention. No thyroid enlargement, no tenderness.  LUNGS: Normal breath sounds bilaterally, no wheezing, rales,rhonchi  No use of accessory muscles of respiration.  CARDIOVASCULAR: S1, S2 normal. No murmurs, rubs, or gallops.  ABDOMEN: Soft, non-tender, non-distended. Bowel sounds present. No organomegaly or mass.  EXTREMITIES: No pedal edema, cyanosis, or clubbing.  PSYCHIATRIC: The patient is alert and oriented x 3.  SKIN: No obvious rash, lesion, or ulcer.   DATA REVIEW:   CBC  Recent Labs Lab 02/18/15 1726  WBC 11.2*  HGB 12.9  HCT 38.9  PLT 260    Chemistries   Recent Labs Lab 02/18/15 1726  NA 140  K 3.9  CL 109  CO2 21*  GLUCOSE 96  BUN 16  CREATININE 1.00  CALCIUM 8.8*  AST 21  ALT 8*  ALKPHOS 42  BILITOT 0.5    Cardiac Enzymes  Recent Labs Lab 02/18/15 1726 02/18/15 2137 02/19/15 0644  TROPONINI <0.03 <0.03 <0.03    Microbiology Results  @  RADIOLOGY:  Dg Chest Portable 1 View  02/18/2015  CLINICAL DATA:  Tachycardia, dementia. EXAM: PORTABLE CHEST 1 VIEW COMPARISON:  Chest x-ray dated 01/24/2009 FINDINGS: Heart size is upper normal. Overall cardiomediastinal silhouette is stable in size and configuration. Lungs appear clear. No evidence of pneumonia seen. No pleural effusion. No acute osseous abnormality. IMPRESSION: No evidence of acute cardiopulmonary abnormality. Electronically Signed   By: Bary Richard M.D.   On: 02/18/2015 18:09      Management plans discussed with the patient and she is in agreement. Stable for discharge home  Patient should follow up with cardiology on Thursday  CODE STATUS:     Code Status Orders        Start     Ordered   02/18/15 1955  Full code   Continuous     02/18/15 1956      TOTAL TIME TAKING CARE OF THIS PATIENT: 35 minutes.    Note: This dictation was prepared with Dragon dictation along with smaller phrase technology. Any transcriptional errors that result from this process are unintentional.  Tesla Keeler M.D on 02/19/2015 at 1:10 PM  Between 7am to 6pm - Pager - [phone] After 6pm go to [email or web address] - password [email or web address] [facility]  [place] [facility]  Office  [phone]  CC: Primary care physician; Delton Prairie, FNP

## 2015-02-19 NOTE — Care Management (Signed)
Patient presented from home environment for tachycardia.  She lives with her granddaughter who says that patient's heart has been 'thumping out of her chest"  .  [facility] is followed by [facility].  She is seen in center 3 days a week.  Granddaughter Clide CliffLucy McAdoo will transport home.  She is considering changing physicians because "those doctors just do not listen."  Arita Missace is aware of admission and informed of discharge.

## 2015-02-22 DIAGNOSIS — K219 Gastro-esophageal reflux disease without esophagitis: Secondary | ICD-10-CM | POA: Insufficient documentation

## 2015-02-22 DIAGNOSIS — R0602 Shortness of breath: Secondary | ICD-10-CM | POA: Insufficient documentation

## 2015-02-22 DIAGNOSIS — I10 Essential (primary) hypertension: Secondary | ICD-10-CM | POA: Insufficient documentation

## 2015-02-22 DIAGNOSIS — F0280 Dementia in other diseases classified elsewhere without behavioral disturbance: Secondary | ICD-10-CM | POA: Insufficient documentation

## 2015-02-22 DIAGNOSIS — G309 Alzheimer's disease, unspecified: Secondary | ICD-10-CM

## 2015-02-22 DIAGNOSIS — E782 Mixed hyperlipidemia: Secondary | ICD-10-CM | POA: Insufficient documentation

## 2015-04-07 ENCOUNTER — Emergency Department
Admission: EM | Admit: 2015-04-07 | Discharge: 2015-04-07 | Disposition: A | Discharge status: Home / Self Care | Payer: Medicare (Managed Care) | Attending: Emergency Medicine | Admitting: Emergency Medicine

## 2015-04-07 DIAGNOSIS — Z7901 Long term (current) use of anticoagulants: Secondary | ICD-10-CM | POA: Diagnosis not present

## 2015-04-07 DIAGNOSIS — Z79899 Other long term (current) drug therapy: Secondary | ICD-10-CM | POA: Insufficient documentation

## 2015-04-07 DIAGNOSIS — K644 Residual hemorrhoidal skin tags: Secondary | ICD-10-CM | POA: Insufficient documentation

## 2015-04-07 DIAGNOSIS — F0390 Unspecified dementia without behavioral disturbance: Secondary | ICD-10-CM | POA: Insufficient documentation

## 2015-04-07 DIAGNOSIS — K625 Hemorrhage of anus and rectum: Secondary | ICD-10-CM | POA: Diagnosis present

## 2015-04-07 DIAGNOSIS — I10 Essential (primary) hypertension: Secondary | ICD-10-CM | POA: Insufficient documentation

## 2015-04-07 DIAGNOSIS — Z792 Long term (current) use of antibiotics: Secondary | ICD-10-CM | POA: Diagnosis not present

## 2015-04-07 LAB — CBC
HCT: 38.2 % (ref 35.0–47.0)
HEMOGLOBIN: 12.3 g/dL (ref 12.0–16.0)
MCH: 30.3 pg (ref 26.0–34.0)
MCHC: 32.2 g/dL (ref 32.0–36.0)
MCV: 93.9 fL (ref 80.0–100.0)
Platelets: 228 10*3/uL (ref 150–440)
RBC: 4.07 MIL/uL (ref 3.80–5.20)
RDW: 14.7 % — ABNORMAL HIGH (ref 11.5–14.5)
WBC: 9.9 10*3/uL (ref 3.6–11.0)

## 2015-04-07 LAB — COMPREHENSIVE METABOLIC PANEL
ALBUMIN: 3.8 g/dL (ref 3.5–5.0)
ALK PHOS: 48 U/L (ref 38–126)
ALT: 12 U/L — AB (ref 14–54)
AST: 22 U/L (ref 15–41)
Anion gap: 7 (ref 5–15)
BUN: 27 mg/dL — ABNORMAL HIGH (ref 6–20)
CALCIUM: 9 mg/dL (ref 8.9–10.3)
CO2: 25 mmol/L (ref 22–32)
Chloride: 109 mmol/L (ref 101–111)
Creatinine, Ser: 1.23 mg/dL — ABNORMAL HIGH (ref 0.44–1.00)
GFR calc non Af Amer: 39 mL/min — ABNORMAL LOW (ref >60)
GFR, EST AFRICAN AMERICAN: 45 mL/min — AB (ref >60)
Glucose, Bld: 108 mg/dL — ABNORMAL HIGH (ref 65–99)
POTASSIUM: 5.1 mmol/L (ref 3.5–5.1)
SODIUM: 141 mmol/L (ref 135–145)
TOTAL PROTEIN: 7.4 g/dL (ref 6.5–8.1)
Total Bilirubin: 0.6 mg/dL (ref 0.3–1.2)

## 2015-04-07 LAB — TYPE AND SCREEN
ABO/RH(D): A POS
ANTIBODY SCREEN: NEGATIVE

## 2015-04-07 LAB — ABO/RH: ABO/RH(D): A POS

## 2015-04-07 NOTE — ED Notes (Signed)
Dr York CeriseForbach notified that pt will not allow EKG and vitals at this time.  MD acknowledged.

## 2015-04-07 NOTE — Discharge Instructions (Signed)
Fortunately it appears that Rebecca Webb's bleeding is coming from her hemorrhoids rather than from her intestines.  We do not recommend changing her medications at this time.  Please read through the included information and follow up with the [facility] at the next available opportunity.    Return to the Emergency Department with new or worsening symptoms that concern you.   Hemorrhoids Hemorrhoids are swollen veins around the rectum or anus. There are two types of hemorrhoids:   Internal hemorrhoids. These occur in the veins just inside the rectum. They may poke through to the outside and become irritated and painful.  External hemorrhoids. These occur in the veins outside the anus and can be felt as a painful swelling or hard lump near the anus. CAUSES  Pregnancy.   Obesity.   Constipation or diarrhea.   Straining to have a bowel movement.   Sitting for long periods on the toilet.  Heavy lifting or other activity that caused you to strain.  Anal intercourse. SYMPTOMS   Pain.   Anal itching or irritation.   Rectal bleeding.   Fecal leakage.   Anal swelling.   One or more lumps around the anus.  DIAGNOSIS  Your caregiver may be able to diagnose hemorrhoids by visual examination. Other examinations or tests that may be performed include:   Examination of the rectal area with a gloved hand (digital rectal exam).   Examination of anal canal using a small tube (scope).   A blood test if you have lost a significant amount of blood.  A test to look inside the colon (sigmoidoscopy or colonoscopy). TREATMENT Most hemorrhoids can be treated at home. However, if symptoms do not seem to be getting better or if you have a lot of rectal bleeding, your caregiver may perform a procedure to help make the hemorrhoids get smaller or remove them completely. Possible treatments include:   Placing a rubber band at the base of the hemorrhoid to cut off the circulation  (rubber band ligation).   Injecting a chemical to shrink the hemorrhoid (sclerotherapy).   Using a tool to burn the hemorrhoid (infrared light therapy).   Surgically removing the hemorrhoid (hemorrhoidectomy).   Stapling the hemorrhoid to block blood flow to the tissue (hemorrhoid stapling).  HOME CARE INSTRUCTIONS   Eat foods with fiber, such as whole grains, beans, nuts, fruits, and vegetables. Ask your doctor about taking products with added fiber in them (fibersupplements).  Increase fluid intake. Drink enough water and fluids to keep your urine clear or pale yellow.   Exercise regularly.   Go to the bathroom when you have the urge to have a bowel movement. Do not wait.   Avoid straining to have bowel movements.   Keep the anal area dry and clean. Use wet toilet paper or moist towelettes after a bowel movement.   Medicated creams and suppositories may be used or applied as directed.   Only take over-the-counter or prescription medicines as directed by your caregiver.   Take warm sitz baths for 15-20 minutes, 3-4 times a day to ease pain and discomfort.   Place ice packs on the hemorrhoids if they are tender and swollen. Using ice packs between sitz baths may be helpful.   Put ice in a plastic bag.   Place a towel between your skin and the bag.   Leave the ice on for 15-20 minutes, 3-4 times a day.   Do not use a donut-shaped pillow or sit on the toilet for  long periods. This increases blood pooling and pain.  SEEK MEDICAL CARE IF:  You have increasing pain and swelling that is not controlled by treatment or medicine.  You have uncontrolled bleeding.  You have difficulty or you are unable to have a bowel movement.  You have pain or inflammation outside the area of the hemorrhoids. MAKE SURE YOU:  Understand these instructions.  Will watch your condition.  Will get help right away if you are not doing well or get worse.   This information is  not intended to replace advice given to you by your health care provider. Make sure you discuss any questions you have with your health care provider.   Document Released: 04/04/2000 Document Revised: 03/24/2012 Document Reviewed: 02/10/2012 Elsevier Interactive Patient Education 2016 Elsevier Inc.  High-Fiber Diet Fiber, also called dietary fiber, is a type of carbohydrate found in fruits, vegetables, whole grains, and beans. A high-fiber diet can have many health benefits. Your health care provider may recommend a high-fiber diet to help:  Prevent constipation. Fiber can make your bowel movements more regular.  Lower your cholesterol.  Relieve hemorrhoids, uncomplicated diverticulosis, or irritable bowel syndrome.  Prevent overeating as part of a weight-loss plan.  Prevent heart disease, type 2 diabetes, and certain cancers. WHAT IS MY PLAN? The recommended daily intake of fiber includes:  38 grams for men under age 68.  30 grams for men over age 55.  25 grams for women under age 64.  21 grams for women over age 70. You can get the recommended daily intake of dietary fiber by eating a variety of fruits, vegetables, grains, and beans. Your health care provider may also recommend a fiber supplement if it is not possible to get enough fiber through your diet. WHAT DO I NEED TO KNOW ABOUT A HIGH-FIBER DIET?  Fiber supplements have not been widely studied for their effectiveness, so it is better to get fiber through food sources.  Always check the fiber content on thenutrition facts label of any prepackaged food. Look for foods that contain at least 5 grams of fiber per serving.  Ask your dietitian if you have questions about specific foods that are related to your condition, especially if those foods are not listed in the following section.  Increase your daily fiber consumption gradually. Increasing your intake of dietary fiber too quickly may cause bloating, cramping, or  gas.  Drink plenty of water. Water helps you to digest fiber. WHAT FOODS CAN I EAT? Grains Whole-grain breads. Multigrain cereal. Oats and oatmeal. Brown rice. Barley. Bulgur wheat. Millet. Bran muffins. Popcorn. Rye wafer crackers. Vegetables Sweet potatoes. Spinach. Kale. Artichokes. Cabbage. Broccoli. Green peas. Carrots. Squash. Fruits Berries. Pears. Apples. Oranges. Avocados. Prunes and raisins. Dried figs. Meats and Other Protein Sources Navy, kidney, pinto, and soy beans. Split peas. Lentils. Nuts and seeds. Dairy Fiber-fortified yogurt. Beverages Fiber-fortified soy milk. Fiber-fortified orange juice. Other Fiber bars. The items listed above may not be a complete list of recommended foods or beverages. Contact your dietitian for more options. WHAT FOODS ARE NOT RECOMMENDED? Grains White bread. Pasta made with refined flour. White rice. Vegetables Fried potatoes. Canned vegetables. Well-cooked vegetables.  Fruits Fruit juice. Cooked, strained fruit. Meats and Other Protein Sources Fatty cuts of meat. Fried Environmental education officer or fried fish. Dairy Milk. Yogurt. Cream cheese. Sour cream. Beverages Soft drinks. Other Cakes and pastries. Butter and oils. The items listed above may not be a complete list of foods and beverages to avoid. Contact your dietitian  for more information. WHAT ARE SOME TIPS FOR INCLUDING HIGH-FIBER FOODS IN MY DIET?  Eat a wide variety of high-fiber foods.  Make sure that half of all grains consumed each day are whole grains.  Replace breads and cereals made from refined flour or white flour with whole-grain breads and cereals.  Replace white rice with brown rice, bulgur wheat, or millet.  Start the day with a breakfast that is high in fiber, such as a cereal that contains at least 5 grams of fiber per serving.  Use beans in place of meat in soups, salads, or pasta.  Eat high-fiber snacks, such as berries, raw vegetables, nuts, or popcorn.   This  information is not intended to replace advice given to you by your health care provider. Make sure you discuss any questions you have with your health care provider.   Document Released: 04/07/2005 Document Revised: 04/28/2014 Document Reviewed: 09/20/2013 Elsevier Interactive Patient Education [employer]

## 2015-04-07 NOTE — ED Notes (Signed)
Pt brought to ED by granddaughter who reports around 1 am while getting ready for bed she noticed pt was bleeding from her bottom. Pt has dementia and is unable to answer specific questions.

## 2015-04-07 NOTE — ED Provider Notes (Signed)
[facility] Emergency Department Provider Note  ____________________________________________  Time seen: Approximately 5:06 AM  I have reviewed the triage vital signs and the nursing notes.   HISTORY  Chief Complaint Rectal Bleeding  History and exam limited by the patient's advanced dementia and lack of cooperation.  History is provided by granddaughter/POA.  HPI Rebecca Webb is a 81 y.o. female with advanced dementia who is on apixaban.  She was brought in by her granddaughter because when her granddaughter is getting her ready for bed she noticed blood in the patient's depends.  She removed the depends and then put another one on a checked shortly and there were some more blood present.  The patient is not complaining of any pain or discomfort.  He denies any abdominal pain.  She has not had any nausea or vomiting.  Her granddaughters not sure if she has ever had these types of symptoms in the past.  It is unclear exactly how much blood was seen but apparently was enough to concern her.  The patient's primary care is managed by [facility], and they were contacted by phone and recommended that she come to the emergency department.   Past Medical History  Diagnosis Date  . Hypertension   . GERD (gastroesophageal reflux disease)   . Dementia     Patient Active Problem List   Diagnosis Date Noted  . A-fib (HCC) 02/18/2015    Past Surgical History  Procedure Laterality Date  . Cataract extraction    . Kidney donation      Current Outpatient Rx  Name  Route  Sig  Dispense  Refill  . apixaban (ELIQUIS) 2.5 MG TABS tablet   Oral   Take 1 tablet (2.5 mg total) by mouth 2 (two) times daily.   60 tablet   0   . cefUROXime (CEFTIN) 250 MG tablet   Oral   Take 1 tablet (250 mg total) by mouth 2 (two) times daily with a meal.   10 tablet   0   . citalopram (CELEXA) 20 MG tablet   Oral   Take 20 mg by mouth daily.         Marland Kitchen. esomeprazole  (NEXIUM) 40 MG capsule   Oral   Take 40 mg by mouth daily.         . isosorbide mononitrate (IMDUR) 30 MG 24 hr tablet   Oral   Take 30 mg by mouth daily.         Bernadette Hoit. Sennosides-Docusate Sodium (SENEXON-S PO)   Oral   Take 2 tablets by mouth daily.         . simvastatin (ZOCOR) 20 MG tablet   Oral   Take 20 mg by mouth daily.         . sotalol (BETAPACE) 80 MG tablet   Oral   Take 1 tablet (80 mg total) by mouth every 12 (twelve) hours.   60 tablet   0   . Vitamin D, Ergocalciferol, (DRISDOL) 50000 UNITS CAPS capsule   Oral   Take 50,000 Units by mouth every 30 (thirty) days.           Allergies Lisinopril  No family history on file.  Social History Social History  Substance Use Topics  . Smoking status: Never Smoker   . Smokeless tobacco: Not on file  . Alcohol Use: No    Review of Systems Unable to obtain a reliable review of systems given the patient's advanced dementia,  but she complains of no pain or discomfort at this time.  ____________________________________________   PHYSICAL EXAM:  VITAL SIGNS: ED Triage Vitals  Enc Vitals Group     BP 04/07/15 0207 139/77 mmHg     Pulse Rate 04/07/15 0207 78     Resp 04/07/15 0207 20     Temp 04/07/15 0207 97.6 F (36.4 C)     Temp Source 04/07/15 0207 Oral     SpO2 04/07/15 0207 99 %     Weight 04/07/15 0207 130 lb (58.968 kg)     Height 04/07/15 0207  (1.6 m)     Head Cir --      Peak Flow --      Pain Score --      Pain Loc --      Pain Edu? --      Excl. in GC? --     Constitutional: Alert and interactive, but oriented only to self.  No acute distress Eyes: Conjunctivae are normal. PERRL. EOMI. Head: Atraumatic. Nose: No congestion/rhinnorhea. Mouth/Throat: Mucous membranes are moist.  Oropharynx non-erythematous. Neck: No stridor.   Cardiovascular: Normal rate, regular rhythm. Grossly normal heart sounds.  Good peripheral circulation. Respiratory: Normal respiratory effort.  No  retractions. Lungs CTAB. Gastrointestinal: Soft and nontender. No distention. No abdominal bruits. No CVA tenderness. Rectal:  Large external hemorrhoid that has an area of bleeding.  There is no thrombosis.  Digital exam is notable for Hemoccult negative brown stool (quality control verified on hemoccult card).   Musculoskeletal: No lower extremity tenderness nor edema.  No joint effusions. Neurologic:  Normal speech and language. No gross focal neurologic deficits are appreciated.  Skin:  Skin is warm, dry and intact. No rash noted.   ____________________________________________   LABS (all labs ordered are listed, but only abnormal results are displayed)  Labs Reviewed  COMPREHENSIVE METABOLIC PANEL - Abnormal; Notable for the following:    Glucose, Bld 108 (*)    BUN 27 (*)    Creatinine, Ser 1.23 (*)    ALT 12 (*)    GFR calc non Af Amer 39 (*)    GFR calc Af Amer 45 (*)    All other components within normal limits  CBC - Abnormal; Notable for the following:    RDW 14.7 (*)    All other components within normal limits  TYPE AND SCREEN  ABO/RH   ____________________________________________  EKG  Not indicated ____________________________________________  RADIOLOGY   No results found.  ____________________________________________   PROCEDURES  Procedure(s) performed: None  Critical Care performed: No ____________________________________________   INITIAL IMPRESSION / ASSESSMENT AND PLAN / ED COURSE  Pertinent labs & imaging results that were available during my care of the patient were reviewed by me and considered in my medical decision making (see chart for details).  I believe that the blood seen as coming from the patient's external hemorrhoid.  There is no evidence of blood in her stool given the negative Hemoccult card and the light brown stool.  Her hemoglobin is normal.  I discussed all this with the patient's granddaughter and explained the need for  hemorrhoid treatment and outpatient follow-up.  Even though she is on an anticoagulation medication of bleach she should stay on it but needs close outpatient follow-up, which I also explained.  I gave my usual and customary return precautions.     ____________________________________________  FINAL CLINICAL IMPRESSION(S) / ED DIAGNOSES  Final diagnoses:  Bleeding external hemorrhoids  NEW MEDICATIONS STARTED DURING THIS VISIT:  Discharge Medication List as of 04/07/2015  5:11 AM       Loleta Rose, MD 04/07/15 [id number]

## 2015-04-07 NOTE — ED Notes (Signed)
Pt. Going home with family. 

## 2015-04-07 NOTE — ED Notes (Signed)
Pt refusing cardiac monitoring, EKG, and vitals at this time.

## 2015-08-23 DIAGNOSIS — E559 Vitamin D deficiency, unspecified: Secondary | ICD-10-CM | POA: Insufficient documentation

## 2015-08-23 DIAGNOSIS — N183 Chronic kidney disease, stage 3 unspecified: Secondary | ICD-10-CM | POA: Insufficient documentation

## 2015-08-23 DIAGNOSIS — R7303 Prediabetes: Secondary | ICD-10-CM | POA: Insufficient documentation

## 2015-08-23 DIAGNOSIS — R32 Unspecified urinary incontinence: Secondary | ICD-10-CM | POA: Insufficient documentation

## 2015-08-23 DIAGNOSIS — F325 Major depressive disorder, single episode, in full remission: Secondary | ICD-10-CM | POA: Insufficient documentation

## 2015-08-24 DIAGNOSIS — E7800 Pure hypercholesterolemia, unspecified: Secondary | ICD-10-CM | POA: Insufficient documentation

## 2015-10-26 ENCOUNTER — Encounter: Payer: Self-pay | Admitting: Emergency Medicine

## 2015-10-26 ENCOUNTER — Emergency Department
Admission: EM | Admit: 2015-10-26 | Discharge: 2015-10-26 | Disposition: A | Discharge status: Home / Self Care | Payer: Medicare Other | Attending: Emergency Medicine | Admitting: Emergency Medicine

## 2015-10-26 DIAGNOSIS — I10 Essential (primary) hypertension: Secondary | ICD-10-CM | POA: Diagnosis not present

## 2015-10-26 DIAGNOSIS — Z5321 Procedure and treatment not carried out due to patient leaving prior to being seen by health care provider: Secondary | ICD-10-CM | POA: Diagnosis not present

## 2015-10-26 DIAGNOSIS — R1310 Dysphagia, unspecified: Secondary | ICD-10-CM | POA: Insufficient documentation

## 2015-10-26 NOTE — ED Notes (Signed)
Patient to ER for difficulty swallowing. Family states patient chews, but then is unable to swallow adequately. Denies any other symptoms.

## 2015-11-06 ENCOUNTER — Other Ambulatory Visit: Payer: Self-pay | Admitting: Internal Medicine

## 2015-11-06 DIAGNOSIS — N182 Chronic kidney disease, stage 2 (mild): Secondary | ICD-10-CM

## 2015-11-12 ENCOUNTER — Ambulatory Visit
Admission: RE | Admit: 2015-11-12 | Discharge: 2015-11-12 | Disposition: A | Discharge status: Home / Self Care | Payer: Medicare Other | Source: Ambulatory Visit | Attending: Internal Medicine | Admitting: Internal Medicine

## 2015-11-12 DIAGNOSIS — N182 Chronic kidney disease, stage 2 (mild): Secondary | ICD-10-CM | POA: Insufficient documentation

## 2015-12-05 DIAGNOSIS — F5104 Psychophysiologic insomnia: Secondary | ICD-10-CM | POA: Insufficient documentation

## 2015-12-05 DIAGNOSIS — R451 Restlessness and agitation: Secondary | ICD-10-CM | POA: Insufficient documentation

## 2016-01-08 ENCOUNTER — Encounter: Payer: Self-pay | Admitting: Psychiatry

## 2016-01-08 ENCOUNTER — Ambulatory Visit: Payer: Medicaid Other | Admitting: Psychiatry

## 2016-01-08 ENCOUNTER — Ambulatory Visit (INDEPENDENT_AMBULATORY_CARE_PROVIDER_SITE_OTHER): Payer: Medicare Other | Admitting: Psychiatry

## 2016-01-08 VITALS — BP 135/76 | HR 68 | Ht 63.0 in | Wt 118.8 lb

## 2016-01-08 DIAGNOSIS — F0391 Unspecified dementia with behavioral disturbance: Secondary | ICD-10-CM

## 2016-01-08 DIAGNOSIS — F03918 Unspecified dementia, unspecified severity, with other behavioral disturbance: Secondary | ICD-10-CM

## 2016-01-08 MED ORDER — MEMANTINE HCL ER 14 MG PO CP24: 14.0000 mg | ORAL_CAPSULE | Freq: Every day | ORAL | 0 refills | Status: DC

## 2016-01-08 NOTE — Progress Notes (Signed)
Psychiatric Initial Adult Assessment   Patient Identification: Rebecca Webb MRN:  [id number] Date of Evaluation:  01/08/2016 Referral Source: [facility]  Chief Complaint:   Chief Complaint    Establish Care     Visit Diagnosis:    ICD-9-CM ICD-10-CM   1. Dementia with behavioral disturbance 294.21 F03.91     History of Present Illness:    Patient is a 82 year old female who presented with [name], who is her guardian and power of attorney and her granddaughter. Patient has history of advanced dementia and is unable to provide any comprehensive history. She was restless and was pacing in my office. Her grand daughter provided most of the information. She reported that patient has history of  dementia and was in the [place] program for 4 years when she took her out in November 2016. She reported that patient did not improve while she was going to the program but started becoming more sedated towards the end and as she was started on medications including Haldol. She also had a allergic reaction to the medications.  Her daughter reported that she is concerned about the behavior of her grandmother. She reported that she is unable to sit at times and she thinks that she might be bipolar. She has poor memory and is unable to communicate. She is restless and  paces all the time and has been talking to herself. Her daughter takes care of her. She has been giving her medications and the granddaughter has eventually stopped all her medical medications and is only giving her aspirin 81 mg. She reported that she has an appointment with a cardiologist tomorrow. Patient is only on Remeron 30 mg and temazepam which is being given to her at 4 PM.  The granddaughter wants her medications to be adjusted. She reported that she eats well and does not have any other problems. However she thinks that she might be bipolar and wants her medications to be adjusted.  She was unable to respond to any questions on  MMSE.  She does not have any suicidal homicidal ideations or plans at this time.   Associated Signs/Symptoms: Depression Symptoms:  depressed mood, difficulty concentrating, (Hypo) Manic Symptoms:  Distractibility, Labiality of Mood, Anxiety Symptoms:  Excessive Worry, Psychotic Symptoms:  Hallucinations: Visual PTSD Symptoms: Negative NA  Past Psychiatric History:  Patient has never seen a psychiatrist. She has history of advanced dementia. She is getting medications from her primary care physician.  Previous Psychotropic Medications: Haldol  Remeron Celexa Temazepam  Substance Abuse History in the last 12 months:  No.  Consequences of Substance Abuse: Negative NA  Past Medical History:  Past Medical History:  Diagnosis Date  . Atrial fibrillation (HCC)   . Chronic kidney disease   . Dementia   . Dementia   . Depression   . GERD (gastroesophageal reflux disease)   . Hyperlipemia   . Hypertension   . Vitamin D deficiency     Past Surgical History:  Procedure Laterality Date  . CATARACT EXTRACTION    . KIDNEY DONATION      Family Psychiatric History:  Daughter- Polio Daughters- Bipolar  Granddaughter- Bipolar  Family History: History reviewed. No pertinent family history.  Social History:   Social History   Social History  . Marital status: Widowed    Spouse name: N/A  . Number of children: N/A  . Years of education: N/A   Social History Main Topics  . Smoking status: Never Smoker  . Smokeless tobacco: Current  User    Types: Snuff  . Alcohol use No  . Drug use: No  . Sexual activity: Not Currently   Other Topics Concern  . None   Social History Narrative  . None    Additional Social History:  Widow- Lives with grand daughter, who is POA and guardian. She has 7 children.   Allergies:   Allergies  Allergen Reactions  . Lisinopril Swelling    Tongue and lips was swollen.  . Haldol [Haloperidol Lactate] Rash    Metabolic Disorder  Labs: No results found for: HGBA1C, MPG No results found for: PROLACTIN No results found for: CHOL, TRIG, HDL, CHOLHDL, VLDL, LDLCALC   Current Medications: Current Outpatient Prescriptions  Medication Sig Dispense Refill  . aspirin EC 81 MG tablet Take by mouth.    . mirtazapine (REMERON) 30 MG tablet     . Sennosides-Docusate Sodium (SENEXON-S PO) Take 2 tablets by mouth daily.    . Vitamin D, Ergocalciferol, (DRISDOL) 50000 UNITS CAPS capsule Take 50,000 Units by mouth every 30 (thirty) days.    . memantine (NAMENDA XR) 14 MG CP24 24 hr capsule Take 1 capsule (14 mg total) by mouth daily. 30 capsule 0   No current facility-administered medications for this visit.     Neurologic: Headache: No Seizure: No Paresthesias:No  Musculoskeletal: Strength & Muscle Tone: within normal limits Gait & Station: unable to sit, restless Patient leans: N/A  Psychiatric Specialty Exam: ROS  Blood pressure 135/76, pulse 68, height 5\' 3"  (1.6 m), weight 118 lb 12.8 oz (53.9 kg).Body mass index is 21.04 kg/m.  General Appearance: Casual  Eye Contact:  Minimal  Speech:  mumbling to self  Volume:  Decreased  Mood:  Anxious  Affect:  Constricted  Thought Process:  Disorganized  Orientation:  Other:  alert, not oriented to time place or person.  Thought Content:  Illogical  Suicidal Thoughts:  No  Homicidal Thoughts:  No  Memory:  Immediate;   impaired Recent;   Poor Remote;   Poor  Judgement:  Impaired  Insight:  Lacking  Psychomotor Activity:  Restlessness  Concentration:  Concentration: Poor and Attention Span: Poor  Recall:  Poor  Fund of Knowledge:Poor  Language: Fair  Akathisia:  No  Handed:  Right  AIMS (if indicated):    Assets:  Physical Health Social Support  ADL's:  Intact  Cognition: Impaired,  Moderate  Sleep:  good    Treatment Plan Summary: Medication management    I have discussed with her and daughter at length about the medications treatment risks benefits  and alternatives. I will discontinue the temazepam as it is causing confusion and increased risk of falls especially in the elderly patient and she agreed with the plan. I will  start her on Namenda XR 14 mg daily to help with the behavioral problems.  Follow up in 2 weeks    More than 50% of the time spent in psychoeducation, counseling and coordination of care.    This note was generated in part or whole with voice recognition software. Voice regonition is usually quite accurate but there are transcription errors that can and very often do occur. I apologize for any typographical errors that were not detected and corrected.    Brandy HaleUzma Maralyn Witherell, MD 9/19/201711:45 AM

## 2016-01-09 ENCOUNTER — Telehealth: Payer: Self-pay

## 2016-01-09 NOTE — Telephone Encounter (Signed)
faxed and confirmed approval letter to pharmacy

## 2016-01-09 NOTE — Telephone Encounter (Signed)
approved for nameda xr 14mg  from 01-09-16 to  04-20-16

## 2016-01-09 NOTE — Telephone Encounter (Signed)
prior Berkley Harveyauth is needed for namenda xr.

## 2016-01-09 NOTE — Telephone Encounter (Signed)
prior Berkley Harveyauth was entered waiting on approval.  did pa online at [email or web address].      pending review.

## 2016-01-22 ENCOUNTER — Ambulatory Visit: Payer: Medicare Other | Admitting: Psychiatry

## 2016-01-24 ENCOUNTER — Ambulatory Visit (INDEPENDENT_AMBULATORY_CARE_PROVIDER_SITE_OTHER): Payer: Medicare Other | Admitting: Psychiatry

## 2016-01-24 ENCOUNTER — Encounter: Payer: Self-pay | Admitting: Psychiatry

## 2016-01-24 VITALS — Wt 117.2 lb

## 2016-01-24 DIAGNOSIS — F0391 Unspecified dementia with behavioral disturbance: Secondary | ICD-10-CM | POA: Diagnosis not present

## 2016-01-24 MED ORDER — MEMANTINE HCL ER 21 MG PO CP24: 21.0000 mg | ORAL_CAPSULE | Freq: Every day | ORAL | 1 refills | Status: DC

## 2016-01-24 MED ORDER — MIRTAZAPINE 30 MG PO TABS: 30.0000 mg | ORAL_TABLET | Freq: Every day | ORAL | 1 refills | Status: DC

## 2016-01-24 NOTE — Progress Notes (Signed)
Psychiatric MD Progress Note  Patient Identification: Rebecca Webb MRN:  [id number] Date of Evaluation:  01/24/2016 Referral Source: [facility]  Chief Complaint:   Chief Complaint    Follow-up; Medication Refill     Visit Diagnosis:    ICD-9-CM ICD-10-CM   1. Dementia with behavioral disturbance, unspecified dementia type 294.21 F03.91     History of Present Illness:    Patient is a 82 year old female who presented with Rebecca Webb, who is her guardian and power of attorney and her granddaughter. Patient has history of advanced dementia and is unable to provide any comprehensive history. Her granddaughter reported that patient has started improving since she was started on Namenda. She was already taking mirtazapine and is sleeping well. Patient did not participate in the interview and she was sitting in the chair. She appeared calm and cooperative. Her daughter reported that she is able to sleep well at night. She reported that she had diarrhea for 3 days after the new medication but now she is able to tolerate it well. She does not have any behavioral problems. She is not sedated on the medication. She takes Namenda in the morning and Remeron at bedtime. She is compliant with the medications. No new symptoms are noted at this time. The granddaughter is interested in increasing the dose of Namenda next month. We discussed about her medications in detail. Patient does not have any behavioral problems at this time.   She was restless and was pacing in my office. Her grand daughter provided most of the information. She reported that patient has history of  dementia and was in the [facility] program for 4 years when she took her out in November 2016. She reported that patient did not improve while she was going to the program but started becoming more sedated towards the end and as she was started on medications including Haldol. She also had a allergic reaction to the medications.  She does not have  any suicidal homicidal ideations or plans at this time.   Associated Signs/Symptoms: Depression Symptoms:  depressed mood, difficulty concentrating, (Hypo) Manic Symptoms:  Distractibility, Labiality of Mood, Anxiety Symptoms:  Excessive Worry, Psychotic Symptoms:  Hallucinations: Visual PTSD Symptoms: Negative NA  Past Psychiatric History:  Patient has never seen a psychiatrist. She has history of advanced dementia. She is getting medications from her primary care physician.  Previous Psychotropic Medications: Haldol  Remeron Celexa Temazepam  Substance Abuse History in the last 12 months:  No.  Consequences of Substance Abuse: Negative NA  Past Medical History:  Past Medical History:  Diagnosis Date  . Atrial fibrillation (HCC)   . Chronic kidney disease   . Dementia   . Dementia   . Depression   . GERD (gastroesophageal reflux disease)   . Hyperlipemia   . Hypertension   . Vitamin D deficiency     Past Surgical History:  Procedure Laterality Date  . CATARACT EXTRACTION    . KIDNEY DONATION      Family Psychiatric History:  Daughter- Polio Daughters- Bipolar  Granddaughter- Bipolar  Family History: History reviewed. No pertinent family history.  Social History:   Social History   Social History  . Marital status: Widowed    Spouse name: N/A  . Number of children: N/A  . Years of education: N/A   Social History Main Topics  . Smoking status: Never Smoker  . Smokeless tobacco: Current User    Types: Snuff  . Alcohol use No  . Drug use:  No  . Sexual activity: Not Currently   Other Topics Concern  . None   Social History Narrative  . None    Additional Social History:  Widow- Lives with grand daughter, who is POA and guardian. She has 7 children.   Allergies:   Allergies  Allergen Reactions  . Lisinopril Swelling    Tongue and lips was swollen.  . Haldol [Haloperidol Lactate] Rash    Metabolic Disorder Labs: No results found  for: HGBA1C, MPG No results found for: PROLACTIN No results found for: CHOL, TRIG, HDL, CHOLHDL, VLDL, LDLCALC   Current Medications: Current Outpatient Prescriptions  Medication Sig Dispense Refill  . aspirin EC 81 MG tablet Take by mouth.    . memantine 21 MG CP24 Take 21 mg by mouth daily. 30 capsule 1  . mirtazapine (REMERON) 30 MG tablet Take 1 tablet (30 mg total) by mouth at bedtime. 30 tablet 1  . Sennosides-Docusate Sodium (SENEXON-S PO) Take 2 tablets by mouth daily.    . Vitamin D, Ergocalciferol, (DRISDOL) 50000 UNITS CAPS capsule Take 50,000 Units by mouth every 30 (thirty) days.     No current facility-administered medications for this visit.     Neurologic: Headache: No Seizure: No Paresthesias:No  Musculoskeletal: Strength & Muscle Tone: within normal limits Gait & Station: unable to sit, restless Patient leans: N/A  Psychiatric Specialty Exam: ROS  Weight 117 lb 3.2 oz (53.2 kg).Body mass index is 20.76 kg/m.  General Appearance: Casual  Eye Contact:  Minimal  Speech:  mumbling to self  Volume:  Decreased  Mood:  Anxious  Affect:  Constricted  Thought Process:  Disorganized  Orientation:  Other:  alert, not oriented to time place or person.  Thought Content:  Illogical  Suicidal Thoughts:  No  Homicidal Thoughts:  No  Memory:  Immediate;   impaired Recent;   Poor Remote;   Poor  Judgement:  Impaired  Insight:  Lacking  Psychomotor Activity:  Restlessness  Concentration:  Concentration: Poor and Attention Span: Poor  Recall:  Poor  Fund of Knowledge:Poor  Language: Fair  Akathisia:  No  Handed:  Right  AIMS (if indicated):    Assets:  Physical Health Social Support  ADL's:  Intact  Cognition: Impaired,  Moderate  Sleep:  good    Treatment Plan Summary: Medication management    I have discussed with her and daughter at length about the medications treatment risks benefits and alternatives.  I will  start her on Namenda XR 21 mg daily  to help with the behavioral problems. It will be filled In 2 weeks. Continue  on Remeron 30 mg at bedtime  Follow up in 4 weeks  Her granddaughter discussed about starting mail order prescriptions the next visit.  I also discussed with the patient and her granddaughter about my departure from this practice. They demonstrated understanding. She was asking about the new provider. Advised them that she will be following with a new provider starting December.   More than 50% of the time spent in psychoeducation, counseling and coordination of care.    This note was generated in part or whole with voice recognition software. Voice regonition is usually quite accurate but there are transcription errors that can and very often do occur. I apologize for any typographical errors that were not detected and corrected.    Brandy Hale, MD 10/5/201710:53 AM

## 2016-03-06 ENCOUNTER — Encounter: Payer: Self-pay | Admitting: Psychiatry

## 2016-03-06 ENCOUNTER — Ambulatory Visit (INDEPENDENT_AMBULATORY_CARE_PROVIDER_SITE_OTHER): Payer: Medicare Other | Admitting: Psychiatry

## 2016-03-06 VITALS — BP 144/80 | HR 63 | Temp 97.9°F | Resp 16 | Wt 119.4 lb

## 2016-03-06 DIAGNOSIS — F0391 Unspecified dementia with behavioral disturbance: Secondary | ICD-10-CM

## 2016-03-06 MED ORDER — MEMANTINE HCL ER 21 MG PO CP24: 21.0000 mg | ORAL_CAPSULE | Freq: Every day | ORAL | 2 refills | Status: DC

## 2016-03-06 MED ORDER — MIRTAZAPINE 15 MG PO TABS: 15.0000 mg | ORAL_TABLET | Freq: Every day | ORAL | 2 refills | Status: DC

## 2016-03-06 NOTE — Progress Notes (Signed)
Psychiatric MD Progress Note  Patient Identification: Rebecca Webb MRN:  [id number] Date of Evaluation:  03/06/2016 Referral Source: [facility]  Chief Complaint:   Chief Complaint    Follow-up     Visit Diagnosis:    ICD-9-CM ICD-10-CM   1. Dementia with behavioral disturbance, unspecified dementia type 294.21 F03.91     History of Present Illness:    Patient is a [age] year old female who presented with Rebecca Webb, who is her guardian and power of attorney and her granddaughter. Patient has history of advanced dementia and is unable to provide any comprehensive history. Her granddaughter reported that patient Is doing well. She continues to cry and has hallucinations of small children. Her granddaughter reported that patient talks to them. Patient has been compliant with her medication. Her granddaughter reported that she has broken sleep  and she feels that the mirtazapine is not helping her. She is interested in decreasing the dose of mirtazapine at this time. Patient did not participate in the interview and was sitting quietly. However the granddaughter is taking her out for walks and has been keeping her comfortable. No acute issues noted at this time. She has been compliant with the medications.  We discussed about her medications in detail. Patient does not have any behavioral problems at this time.   She does not have any suicidal homicidal ideations or plans at this time.   Associated Signs/Symptoms: Depression Symptoms:  depressed mood, difficulty concentrating, (Hypo) Manic Symptoms:  Distractibility, Labiality of Mood, Anxiety Symptoms:  Excessive Worry, Psychotic Symptoms:  Hallucinations: Visual PTSD Symptoms: Negative NA  Past Psychiatric History:  Patient has never seen a psychiatrist. She has history of advanced dementia. She is getting medications from her primary care physician.  Previous Psychotropic Medications: Haldol   Remeron Celexa Temazepam  Substance Abuse History in the last 12 months:  No.  Consequences of Substance Abuse: Negative NA  Past Medical History:  Past Medical History:  Diagnosis Date  . Atrial fibrillation (HCC)   . Chronic kidney disease   . Dementia   . Dementia   . Depression   . GERD (gastroesophageal reflux disease)   . Hyperlipemia   . Hypertension   . Vitamin D deficiency     Past Surgical History:  Procedure Laterality Date  . CATARACT EXTRACTION    . KIDNEY DONATION      Family Psychiatric History:  Daughter- Polio Daughters- Bipolar  Granddaughter- Bipolar  Family History: No family history on file.  Social History:   Social History   Social History  . Marital status: Widowed    Spouse name: N/A  . Number of children: N/A  . Years of education: N/A   Social History Main Topics  . Smoking status: Never Smoker  . Smokeless tobacco: Current User    Types: Snuff  . Alcohol use No  . Drug use: No  . Sexual activity: Not Currently   Other Topics Concern  . None   Social History Narrative  . None    Additional Social History:  Widow- Lives with grand daughter, who is POA and guardian. She has 7 children.   Allergies:   Allergies  Allergen Reactions  . Lisinopril Swelling    Tongue and lips was swollen.  . Haldol [Haloperidol Lactate] Rash    Metabolic Disorder Labs: No results found for: HGBA1C, MPG No results found for: PROLACTIN No results found for: CHOL, TRIG, HDL, CHOLHDL, VLDL, LDLCALC   Current Medications: Current Outpatient Prescriptions  Medication Sig  Dispense Refill  . aspirin EC 81 MG tablet Take by mouth.    . memantine 21 MG CP24 Take 21 mg by mouth daily. 30 capsule 1  . mirtazapine (REMERON) 30 MG tablet Take 1 tablet (30 mg total) by mouth at bedtime. 30 tablet 1  . Sennosides-Docusate Sodium (SENEXON-S PO) Take 2 tablets by mouth daily.    . Vitamin D, Ergocalciferol, (DRISDOL) 50000 UNITS CAPS capsule Take  50,000 Units by mouth every 30 (thirty) days.     No current facility-administered medications for this visit.     Neurologic: Headache: No Seizure: No Paresthesias:No  Musculoskeletal: Strength & Muscle Tone: within normal limits Gait & Station: unable to sit, restless Patient leans: N/A  Psychiatric Specialty Exam: ROS  Blood pressure (!) 144/80, pulse 63, temperature 97.9 F (36.6 C), temperature source Tympanic, resp. rate 16, weight 119 lb 6.4 oz (54.2 kg), SpO2 96 %.Body mass index is 21.15 kg/m.  General Appearance: Casual  Eye Contact:  Minimal  Speech:  mumbling to self  Volume:  Decreased  Mood:  Anxious  Affect:  Constricted  Thought Process:  Disorganized  Orientation:  Other:  alert, not oriented to time place or person.  Thought Content:  Illogical  Suicidal Thoughts:  No  Homicidal Thoughts:  No  Memory:  Immediate;   impaired Recent;   Poor Remote;   Poor  Judgement:  Impaired  Insight:  Lacking  Psychomotor Activity:  Restlessness  Concentration:  Concentration: Poor and Attention Span: Poor  Recall:  Poor  Fund of Knowledge:Poor  Language: Fair  Akathisia:  No  Handed:  Right  AIMS (if indicated):    Assets:  Physical Health Social Support  ADL's:  Intact  Cognition: Impaired,  Moderate  Sleep:  good    Treatment Plan Summary: Medication management    I have discussed with her and daughter at length about the medications treatment risks benefits and alternatives.  I will  start her on Namenda XR 21 mg daily to help with the behavioral problems. It will be filled In 2 weeks. I will decrease Remeron 15  mg at bedtime  Follow up in 2 months or a day depending on her symptoms     More than 50% of the time spent in psychoeducation, counseling and coordination of care.    This note was generated in part or whole with voice recognition software. Voice regonition is usually quite accurate but there are transcription errors that can and very  often do occur. I apologize for any typographical errors that were not detected and corrected.    Brandy HaleUzma Berlyn Malina, MD 11/16/201710:48 AM

## 2016-04-01 ENCOUNTER — Telehealth: Payer: Self-pay

## 2016-04-01 NOTE — Telephone Encounter (Signed)
states that the insurance will not cover the menantine pt will need to try something else.

## 2016-04-07 NOTE — Telephone Encounter (Signed)
called states that patient eyes have been swelling and the eye doctor states it could be the remeron. wants to see if you can change medications.

## 2016-04-09 NOTE — Telephone Encounter (Signed)
I have reviewed her notes and do not see any notes from eye doctor about the same. Will not make any adjustment in her meds as she is doing well on her meds at this time. Her eye might be swelling due to ch kidney disease.

## 2016-05-06 ENCOUNTER — Telehealth: Payer: Self-pay

## 2016-05-06 NOTE — Telephone Encounter (Signed)
Start her on colace 100mg  daily. It is OTC

## 2016-05-06 NOTE — Telephone Encounter (Signed)
pt was given the info

## 2016-05-06 NOTE — Telephone Encounter (Signed)
lisa called about pt.  she like for doctor to call her.  pt was told dr. was not in the office today.  pt states that pt is takin the memantine hcl er 21mg  and it is making pt constipatied.

## 2016-05-07 ENCOUNTER — Ambulatory Visit: Payer: Medicare Other | Admitting: Psychiatry

## 2016-05-23 ENCOUNTER — Ambulatory Visit (INDEPENDENT_AMBULATORY_CARE_PROVIDER_SITE_OTHER): Payer: Medicare Other | Admitting: Psychiatry

## 2016-05-23 ENCOUNTER — Encounter: Payer: Self-pay | Admitting: Psychiatry

## 2016-05-23 VITALS — BP 118/70 | Temp 97.8°F | Wt 122.4 lb

## 2016-05-23 DIAGNOSIS — F0391 Unspecified dementia with behavioral disturbance: Secondary | ICD-10-CM

## 2016-05-23 MED ORDER — MEMANTINE HCL ER 21 MG PO CP24: 21.0000 mg | ORAL_CAPSULE | Freq: Every day | ORAL | 2 refills | Status: DC

## 2016-05-23 MED ORDER — TRAZODONE HCL 50 MG PO TABS: ORAL_TABLET | ORAL | 1 refills | Status: DC

## 2016-05-23 MED ORDER — SERTRALINE HCL 50 MG PO TABS: 50.0000 mg | ORAL_TABLET | Freq: Every day | ORAL | 1 refills | Status: DC

## 2016-05-23 NOTE — Progress Notes (Signed)
Psychiatric MD Progress Note  Patient Identification: REA KALAMA MRN:  [id number] Date of Evaluation:  05/23/2016 Referral Source: [facility]  Chief Complaint:   Chief Complaint    Follow-up; Medication Refill     Visit Diagnosis:    ICD-9-CM ICD-10-CM   1. Dementia with behavioral disturbance, unspecified dementia type 294.21 F03.91     History of Present Illness:    Patient is a 83 year old female who presented with [name], who is her guardian and power of attorney and her granddaughter. Patient has history of advanced dementia and is unable to provide any comprehensive history. Her granddaughter reported that patient Continues to walk around during the daytime and is unable to sit still. Patient was noted to be walking in the room as well. Her daughter reported that she was constipated and she has to use several stool softeners including suppositories and laxative due to her constipation. Her daughter was fixated on the same. We discussed about her medications. Her granddaughter reported that she feels that the medications are making her constipated. We discussed about the side effects of the Remeron. Her granddaughter is interested in having her medications adjusted. Patient does not have any acute memory problems at this time. Patient does not have any perceptual disturbances. She talks to herself and remains at her baseline.  We discussed about her medications in detail. Patient does not have any behavioral problems at this time.   She does not have any suicidal homicidal ideations or plans at this time.   Associated Signs/Symptoms: Depression Symptoms:  depressed mood, difficulty concentrating, (Hypo) Manic Symptoms:  Distractibility, Labiality of Mood, Anxiety Symptoms:  Excessive Worry, Psychotic Symptoms:  Hallucinations: Visual PTSD Symptoms: Negative NA  Past Psychiatric History:  Patient has never seen a psychiatrist. She has history of advanced dementia. She  is getting medications from her primary care physician.  Previous Psychotropic Medications: Haldol  Remeron Celexa Temazepam  Substance Abuse History in the last 12 months:  No.  Consequences of Substance Abuse: Negative NA  Past Medical History:  Past Medical History:  Diagnosis Date  . Atrial fibrillation (HCC)   . Chronic kidney disease   . Dementia   . Dementia   . Depression   . GERD (gastroesophageal reflux disease)   . Hyperlipemia   . Hypertension   . Vitamin D deficiency     Past Surgical History:  Procedure Laterality Date  . CATARACT EXTRACTION    . KIDNEY DONATION      Family Psychiatric History:  Daughter- Polio Daughters- Bipolar  Granddaughter- Bipolar  Family History: History reviewed. No pertinent family history.  Social History:   Social History   Social History  . Marital status: Widowed    Spouse name: N/A  . Number of children: N/A  . Years of education: N/A   Social History Main Topics  . Smoking status: Never Smoker  . Smokeless tobacco: Current User    Types: Snuff  . Alcohol use No  . Drug use: No  . Sexual activity: Not Currently   Other Topics Concern  . None   Social History Narrative  . None    Additional Social History:  Widow- Lives with grand daughter, who is POA and guardian. She has 7 children.   Allergies:   Allergies  Allergen Reactions  . Lisinopril Swelling    Tongue and lips was swollen.  . Haldol [Haloperidol Lactate] Rash    Metabolic Disorder Labs: No results found for: HGBA1C, MPG No results found for:  PROLACTIN No results found for: CHOL, TRIG, HDL, CHOLHDL, VLDL, LDLCALC   Current Medications: Current Outpatient Prescriptions  Medication Sig Dispense Refill  . aspirin EC 81 MG tablet Take by mouth.    . Memantine HCl ER 21 MG CP24 Take 21 mg by mouth daily. 30 capsule 2  . mirtazapine (REMERON) 15 MG tablet Take 1 tablet (15 mg total) by mouth at bedtime. 30 tablet 2  .  Sennosides-Docusate Sodium (SENEXON-S PO) Take 2 tablets by mouth daily.    . Vitamin D, Ergocalciferol, (DRISDOL) 50000 UNITS CAPS capsule Take 50,000 Units by mouth every 30 (thirty) days.     No current facility-administered medications for this visit.     Neurologic: Headache: No Seizure: No Paresthesias:No  Musculoskeletal: Strength & Muscle Tone: within normal limits Gait & Station: unable to sit, restless Patient leans: N/A  Psychiatric Specialty Exam: ROS  Blood pressure 118/70, temperature 97.8 F (36.6 C), temperature source Oral, weight 122 lb 6.4 oz (55.5 kg).Body mass index is 21.68 kg/m.  General Appearance: Casual  Eye Contact:  Minimal  Speech:  mumbling to self  Volume:  Decreased  Mood:  Anxious  Affect:  Constricted  Thought Process:  Disorganized  Orientation:  Other:  alert, not oriented to time place or person.  Thought Content:  Illogical  Suicidal Thoughts:  No  Homicidal Thoughts:  No  Memory:  Immediate;   impaired Recent;   Poor Remote;   Poor  Judgement:  Impaired  Insight:  Lacking  Psychomotor Activity:  Restlessness  Concentration:  Concentration: Poor and Attention Span: Poor  Recall:  Poor  Fund of Knowledge:Poor  Language: Fair  Akathisia:  No  Handed:  Right  AIMS (if indicated):    Assets:  Physical Health Social Support  ADL's:  Intact  Cognition: Impaired,  Moderate  Sleep:  good    Treatment Plan Summary: Medication management    I have discussed with her and daughter at length about the medications treatment risks benefits and alternatives.  Continue  Namenda XR 21 mg daily to help with the behavioral problems. D/c Remeron. I will start her on Zoloft 50 mg and advised her to start taking 25 mg for one week and then gradually titrate the dose. I will also start her on trazodone 25-50 mg by mouth daily at bedtime when necessary for insomnia  Follow up in 1 months or earlier depending on her symptoms     More than  50% of the time spent in psychoeducation, counseling and coordination of care.    This note was generated in part or whole with voice recognition software. Voice regonition is usually quite accurate but there are transcription errors that can and very often do occur. I apologize for any typographical errors that were not detected and corrected.    Brandy HaleUzma Vernestine Brodhead, MD 2/2/201811:35 AM

## 2016-06-21 ENCOUNTER — Other Ambulatory Visit: Payer: Self-pay | Admitting: Psychiatry

## 2016-06-25 ENCOUNTER — Encounter: Payer: Self-pay | Admitting: Psychiatry

## 2016-06-25 ENCOUNTER — Ambulatory Visit (INDEPENDENT_AMBULATORY_CARE_PROVIDER_SITE_OTHER): Payer: Medicare Other | Admitting: Psychiatry

## 2016-06-25 VITALS — Wt 124.0 lb

## 2016-06-25 DIAGNOSIS — F0391 Unspecified dementia with behavioral disturbance: Secondary | ICD-10-CM

## 2016-06-25 MED ORDER — SERTRALINE HCL 50 MG PO TABS: 50.0000 mg | ORAL_TABLET | Freq: Every day | ORAL | 1 refills | Status: DC

## 2016-06-25 MED ORDER — TRAZODONE HCL 50 MG PO TABS: ORAL_TABLET | ORAL | 1 refills | Status: DC

## 2016-06-25 MED ORDER — MEMANTINE HCL ER 21 MG PO CP24: 21.0000 mg | ORAL_CAPSULE | Freq: Every day | ORAL | 2 refills | Status: DC

## 2016-06-25 NOTE — Progress Notes (Signed)
Psychiatric MD Progress Note  Patient Identification: Rebecca ReddenBirtie B Tennis MRN:  [id number] Date of Evaluation:  06/25/2016 Referral Source: [facility]  Chief Complaint:   Chief Complaint    Follow-up; Medication Refill     Visit Diagnosis:    ICD-9-CM ICD-10-CM   1. Dementia with behavioral disturbance, unspecified dementia type 294.21 F03.91     History of Present Illness:    Patient is a 83 year old female who presented with Janie MorningLisa McAdoo, who is her guardian and power of attorney and her granddaughter. Patient has history of advanced dementia and is unable to provide any comprehensive history. Her granddaughter reported that patient Is noticed some improvement since her medications have been adjusted. She was more calm and alert and was able to sit during the interview. She did not exhibit any behavior problems. Her granddaughter also reported that patient has been more calm. She has been compliant with her medications. No acute symptoms noted this time. Her granddaughter continues to tell me about her history in detail. She is able to sleep at night. She is compliant with her medications.  We discussed about her medications in detail. Patient does not have any behavioral problems at this time.   She does not have any suicidal homicidal ideations or plans at this time.   Associated Signs/Symptoms: Depression Symptoms:  depressed mood, difficulty concentrating, (Hypo) Manic Symptoms:  Distractibility, Labiality of Mood, Anxiety Symptoms:  Excessive Worry, Psychotic Symptoms:  Hallucinations: Visual PTSD Symptoms: Negative NA  Past Psychiatric History:  Patient has never seen a psychiatrist. She has history of advanced dementia. She is getting medications from her primary care physician.  Previous Psychotropic Medications: Haldol  Remeron Celexa Temazepam  Substance Abuse History in the last 12 months:  No.  Consequences of Substance Abuse: Negative NA  Past Medical  History:  Past Medical History:  Diagnosis Date  . Atrial fibrillation (HCC)   . Chronic kidney disease   . Dementia   . Dementia   . Depression   . GERD (gastroesophageal reflux disease)   . Hyperlipemia   . Hypertension   . Vitamin D deficiency     Past Surgical History:  Procedure Laterality Date  . CATARACT EXTRACTION    . KIDNEY DONATION      Family Psychiatric History:  Daughter- Polio Daughters- Bipolar  Granddaughter- Bipolar  Family History: History reviewed. No pertinent family history.  Social History:   Social History   Social History  . Marital status: Widowed    Spouse name: N/A  . Number of children: N/A  . Years of education: N/A   Social History Main Topics  . Smoking status: Never Smoker  . Smokeless tobacco: Current User    Types: Snuff  . Alcohol use No  . Drug use: No  . Sexual activity: Not Currently   Other Topics Concern  . None   Social History Narrative  . None    Additional Social History:  Widow- Lives with grand daughter, who is POA and guardian. She has 7 children.   Allergies:   Allergies  Allergen Reactions  . Lisinopril Swelling    Tongue and lips was swollen.  . Haldol [Haloperidol Lactate] Rash    Metabolic Disorder Labs: No results found for: HGBA1C, MPG No results found for: PROLACTIN No results found for: CHOL, TRIG, HDL, CHOLHDL, VLDL, LDLCALC   Current Medications: Current Outpatient Prescriptions  Medication Sig Dispense Refill  . aspirin EC 81 MG tablet Take by mouth.    . Memantine HCl  ER 21 MG CP24 Take 21 mg by mouth daily. 30 capsule 2  . Sennosides-Docusate Sodium (SENEXON-S PO) Take 2 tablets by mouth daily.    . sertraline (ZOLOFT) 50 MG tablet Take 1 tablet (50 mg total) by mouth daily. 30 tablet 1  . traZODone (DESYREL) 50 MG tablet Take 1/2 - 1 pill at bedtime as needed for sleep. 30 tablet 1  . Vitamin D, Ergocalciferol, (DRISDOL) 50000 UNITS CAPS capsule Take 50,000 Units by mouth every 30  (thirty) days.     No current facility-administered medications for this visit.     Neurologic: Headache: No Seizure: No Paresthesias:No  Musculoskeletal: Strength & Muscle Tone: within normal limits Gait & Station: unable to sit, restless Patient leans: N/A  Psychiatric Specialty Exam: ROS  Weight 124 lb (56.2 kg).Body mass index is 21.97 kg/m.  General Appearance: Casual  Eye Contact:  Minimal  Speech:  mumbling to self  Volume:  Decreased  Mood:  Anxious  Affect:  Constricted  Thought Process:  Disorganized  Orientation:  Other:  alert, not oriented to time place or person.  Thought Content:  Illogical  Suicidal Thoughts:  No  Homicidal Thoughts:  No  Memory:  Immediate;   impaired Recent;   Poor Remote;   Poor  Judgement:  Impaired  Insight:  Lacking  Psychomotor Activity:  Restlessness  Concentration:  Concentration: Poor and Attention Span: Poor  Recall:  Poor  Fund of Knowledge:Poor  Language: Fair  Akathisia:  No  Handed:  Right  AIMS (if indicated):    Assets:  Physical Health Social Support  ADL's:  Intact  Cognition: Impaired,  Moderate  Sleep:  good    Treatment Plan Summary: Medication management    I have discussed with her and daughter at length about the medications treatment risks benefits and alternatives.  Continue  Namenda XR 21 mg daily to help with the behavioral problems. Continue Zoloft 50 mg  Daily.  Continue  trazodone 25-50 mg by mouth daily at bedtime when necessary for insomnia  Follow up in 2  months or earlier depending on her symptoms     More than 50% of the time spent in psychoeducation, counseling and coordination of care.    This note was generated in part or whole with voice recognition software. Voice regonition is usually quite accurate but there are transcription errors that can and very often do occur. I apologize for any typographical errors that were not detected and corrected.    Brandy Hale,  MD 3/7/201812:59 PM

## 2016-08-27 ENCOUNTER — Ambulatory Visit: Payer: Medicare Other | Admitting: Psychiatry

## 2016-08-27 ENCOUNTER — Encounter: Payer: Self-pay | Admitting: Psychiatry

## 2016-08-27 ENCOUNTER — Ambulatory Visit (INDEPENDENT_AMBULATORY_CARE_PROVIDER_SITE_OTHER): Payer: Medicare Other | Admitting: Psychiatry

## 2016-08-27 VITALS — BP 108/72 | Wt 115.2 lb

## 2016-08-27 DIAGNOSIS — F0391 Unspecified dementia with behavioral disturbance: Secondary | ICD-10-CM | POA: Diagnosis not present

## 2016-08-27 MED ORDER — MEMANTINE HCL ER 21 MG PO CP24: 21.0000 mg | ORAL_CAPSULE | Freq: Every day | ORAL | 2 refills | Status: DC

## 2016-08-27 NOTE — Progress Notes (Signed)
Psychiatric MD Progress Note  Patient Identification: Rebecca Webb MRN:  [id number] Date of Evaluation:  08/27/2016 Referral Source: [facility]  Chief Complaint:   Chief Complaint    Follow-up; Medication Refill     Visit Diagnosis:    ICD-9-CM ICD-10-CM   1. Dementia with behavioral disturbance, unspecified dementia type 294.21 F03.91     History of Present Illness:    Patient is a 83 year old female who presented with [name], who is her guardian and power of attorney and her granddaughter. Patient has history of advanced dementia and is unable to provide any comprehensive history. Her granddaughter reported that patient Was having a rash after she was started on Zoloft. Her granddaughter remains hyper and reported that she notices the rash one month after she was on Zoloft. She was calling our office and she reported that she did not receive any response. She stated that there was a slight rash on the left side. She took her to the primary care physician. She was seen by her PCP but it was not documented that she was having a reaction from the Zoloft. Her PCP started on olanzapine 7.5 mg. Her granddaughter only give her 1 dose of the medication as she looked up the medication and reported that it was a very high dose. Her granddaughter reported that she also stopped her Namenda without discussion with any provider.  She reported that the patient was having dizziness and she was not walking a straight line.  I advised her that she cannot start and stop the medications without discussion with the providers as she is making the patient worse. She demonstrated understanding. Advised her to continue her on the Namenda as prescribed. She has also stopped her trazodone. Her granddaughter continues to change her medications without discussion with the providers and has been controlling her medications. It is not known what medications she has been given to the patient as she continues to be  over controlling and does not agree with the treatment plan on a regular basis  I will restart her back on the Namenda at this time..  Patient continues to be walking in the office and was mumbling to herself. She does not exhibit any behavior problems. She is at her baseline.   Associated Signs/Symptoms: Depression Symptoms:  difficulty concentrating, (Hypo) Manic Symptoms:  Distractibility, Anxiety Symptoms:  Excessive Worry, Psychotic Symptoms:  Ideas of Reference, PTSD Symptoms: Negative NA  Past Psychiatric History:  Patient has never seen a psychiatrist. She has history of advanced dementia. She is getting medications from her primary care physician.  Previous Psychotropic Medications: Haldol  Remeron Celexa Temazepam  Substance Abuse History in the last 12 months:  No.  Consequences of Substance Abuse: Negative NA  Past Medical History:  Past Medical History:  Diagnosis Date  . Atrial fibrillation (HCC)   . Chronic kidney disease   . Dementia   . Dementia   . Depression   . GERD (gastroesophageal reflux disease)   . Hyperlipemia   . Hypertension   . Vitamin D deficiency     Past Surgical History:  Procedure Laterality Date  . CATARACT EXTRACTION    . KIDNEY DONATION      Family Psychiatric History:  Daughter- Polio Daughters- Bipolar  Granddaughter- Bipolar  Family History: History reviewed. No pertinent family history.  Social History:   Social History   Social History  . Marital status: Widowed    Spouse name: N/A  . Number of children: N/A  .  Years of education: N/A   Social History Main Topics  . Smoking status: Never Smoker  . Smokeless tobacco: Current User    Types: Snuff  . Alcohol use No  . Drug use: No  . Sexual activity: Not Currently   Other Topics Concern  . None   Social History Narrative  . None    Additional Social History:  Widow- Lives with grand daughter, who is POA and guardian. She has 7 children.    Allergies:   Allergies  Allergen Reactions  . Lisinopril Swelling    Tongue and lips was swollen.  . Haldol [Haloperidol Lactate] Rash    Metabolic Disorder Labs: No results found for: HGBA1C, MPG No results found for: PROLACTIN No results found for: CHOL, TRIG, HDL, CHOLHDL, VLDL, LDLCALC   Current Medications: Current Outpatient Prescriptions  Medication Sig Dispense Refill  . aspirin EC 81 MG tablet Take by mouth.    . Memantine HCl ER 21 MG CP24 Take 21 mg by mouth daily. 30 capsule 2  . Sennosides-Docusate Sodium (SENEXON-S PO) Take 2 tablets by mouth daily.    . sertraline (ZOLOFT) 50 MG tablet Take 1 tablet (50 mg total) by mouth daily. 30 tablet 1  . traZODone (DESYREL) 50 MG tablet Take 1/2 - 1 pill at bedtime as needed for sleep. 30 tablet 1  . Vitamin D, Ergocalciferol, (DRISDOL) 50000 UNITS CAPS capsule Take 50,000 Units by mouth every 30 (thirty) days.     No current facility-administered medications for this visit.     Neurologic: Headache: No Seizure: No Paresthesias:No  Musculoskeletal: Strength & Muscle Tone: within normal limits Gait & Station: unable to sit, restless Patient leans: N/A  Psychiatric Specialty Exam: ROS  Blood pressure 108/72, weight 115 lb 3.2 oz (52.3 kg).Body mass index is 20.41 kg/m.  General Appearance: Casual  Eye Contact:  Minimal  Speech:  mumbling to self  Volume:  Decreased  Mood:  Anxious  Affect:  Constricted  Thought Process:  Disorganized  Orientation:  Other:  alert, not oriented to time place or person.  Thought Content:  Illogical  Suicidal Thoughts:  No  Homicidal Thoughts:  No  Memory:  Immediate;   impaired Recent;   Poor Remote;   Poor  Judgement:  Impaired  Insight:  Lacking  Psychomotor Activity:  Restlessness  Concentration:  Concentration: Poor and Attention Span: Poor  Recall:  Poor  Fund of Knowledge:Poor  Language: Fair  Akathisia:  No  Handed:  Right  AIMS (if indicated):    Assets:   Physical Health Social Support  ADL's:  Intact  Cognition: Impaired,  Moderate  Sleep:  good    Treatment Plan Summary: Medication management    I have discussed with her grand daughter at length about the medications treatment risks benefits and alternatives.  Continue  Namenda XR 21 mg daily to help with the behavioral problems. Advised her granddaughter not to start and stop any medications without discussion with a provider. Advised her that we will not be able to manage the patient without continuity of care in her medications. Patient is not showing any behavior problems. She is at her baseline and continues to walk and mumble to herself.   Follow up in 2 weeks  or earlier depending on her symptoms     More than 50% of the time spent in psychoeducation, counseling and coordination of care.    This note was generated in part or whole with voice recognition software. Voice regonition is usually  quite accurate but there are transcription errors that can and very often do occur. I apologize for any typographical errors that were not detected and corrected.    Brandy HaleUzma Naoma Boxell, MD 5/9/201810:13 AM

## 2016-09-08 ENCOUNTER — Ambulatory Visit: Payer: Medicare Other | Admitting: Psychiatry

## 2016-09-10 ENCOUNTER — Ambulatory Visit: Payer: Medicare Other | Admitting: Psychiatry

## 2016-09-24 ENCOUNTER — Ambulatory Visit: Payer: Medicare Other | Admitting: Psychiatry

## 2016-11-23 ENCOUNTER — Other Ambulatory Visit: Payer: Self-pay | Admitting: Psychiatry

## 2016-12-24 ENCOUNTER — Emergency Department
Admission: EM | Admit: 2016-12-24 | Discharge: 2016-12-24 | Disposition: A | Discharge status: Home / Self Care | Payer: Medicare Other | Attending: Emergency Medicine | Admitting: Emergency Medicine

## 2016-12-24 DIAGNOSIS — N183 Chronic kidney disease, stage 3 (moderate): Secondary | ICD-10-CM | POA: Insufficient documentation

## 2016-12-24 DIAGNOSIS — F17218 Nicotine dependence, cigarettes, with other nicotine-induced disorders: Secondary | ICD-10-CM | POA: Insufficient documentation

## 2016-12-24 DIAGNOSIS — I129 Hypertensive chronic kidney disease with stage 1 through stage 4 chronic kidney disease, or unspecified chronic kidney disease: Secondary | ICD-10-CM | POA: Diagnosis not present

## 2016-12-24 DIAGNOSIS — F0280 Dementia in other diseases classified elsewhere without behavioral disturbance: Secondary | ICD-10-CM | POA: Insufficient documentation

## 2016-12-24 DIAGNOSIS — G309 Alzheimer's disease, unspecified: Secondary | ICD-10-CM | POA: Diagnosis not present

## 2016-12-24 DIAGNOSIS — I4891 Unspecified atrial fibrillation: Secondary | ICD-10-CM | POA: Diagnosis not present

## 2016-12-24 DIAGNOSIS — R000 Tachycardia, unspecified: Secondary | ICD-10-CM | POA: Diagnosis present

## 2016-12-24 LAB — COMPREHENSIVE METABOLIC PANEL
ALBUMIN: 3.4 g/dL — AB (ref 3.5–5.0)
ALK PHOS: 55 U/L (ref 38–126)
ALT: 32 U/L (ref 14–54)
ANION GAP: 7 (ref 5–15)
AST: 28 U/L (ref 15–41)
BILIRUBIN TOTAL: 0.8 mg/dL (ref 0.3–1.2)
BUN: 29 mg/dL — AB (ref 6–20)
CALCIUM: 8.7 mg/dL — AB (ref 8.9–10.3)
CO2: 21 mmol/L — ABNORMAL LOW (ref 22–32)
CREATININE: 1.01 mg/dL — AB (ref 0.44–1.00)
Chloride: 116 mmol/L — ABNORMAL HIGH (ref 101–111)
GFR calc Af Amer: 57 mL/min — ABNORMAL LOW (ref >60)
GFR calc non Af Amer: 49 mL/min — ABNORMAL LOW (ref >60)
GLUCOSE: 116 mg/dL — AB (ref 65–99)
Potassium: 4.7 mmol/L (ref 3.5–5.1)
SODIUM: 144 mmol/L (ref 135–145)
TOTAL PROTEIN: 7 g/dL (ref 6.5–8.1)

## 2016-12-24 LAB — URINALYSIS, COMPLETE (UACMP) WITH MICROSCOPIC
BACTERIA UA: NONE SEEN
Bilirubin Urine: NEGATIVE
GLUCOSE, UA: NEGATIVE mg/dL
KETONES UR: NEGATIVE mg/dL
NITRITE: NEGATIVE
PROTEIN: 30 mg/dL — AB
Specific Gravity, Urine: 1.028 (ref 1.005–1.030)
pH: 5 (ref 5.0–8.0)

## 2016-12-24 LAB — CBC WITH DIFFERENTIAL/PLATELET
BASOS PCT: 1 %
Basophils Absolute: 0.1 10*3/uL (ref 0–0.1)
Eosinophils Absolute: 0.1 10*3/uL (ref 0–0.7)
Eosinophils Relative: 1 %
HEMATOCRIT: 36.5 % (ref 35.0–47.0)
HEMOGLOBIN: 12.4 g/dL (ref 12.0–16.0)
LYMPHS ABS: 2.1 10*3/uL (ref 1.0–3.6)
Lymphocytes Relative: 24 %
MCH: 31.8 pg (ref 26.0–34.0)
MCHC: 34 g/dL (ref 32.0–36.0)
MCV: 93.6 fL (ref 80.0–100.0)
MONO ABS: 0.6 10*3/uL (ref 0.2–0.9)
MONOS PCT: 7 %
NEUTROS ABS: 5.8 10*3/uL (ref 1.4–6.5)
Neutrophils Relative %: 67 %
Platelets: 222 10*3/uL (ref 150–440)
RBC: 3.9 MIL/uL (ref 3.80–5.20)
RDW: 14.6 % — ABNORMAL HIGH (ref 11.5–14.5)
WBC: 8.7 10*3/uL (ref 3.6–11.0)

## 2016-12-24 LAB — TROPONIN I: Troponin I: <0.03 ng/mL (ref <0.03)

## 2016-12-24 MED ORDER — DILTIAZEM HCL ER COATED BEADS 240 MG PO CP24
240.0000 mg | ORAL_CAPSULE | Freq: Every day | ORAL | Status: DC
  Administered 2016-12-24: 240 mg via ORAL
  Filled 2016-12-24: qty 1

## 2016-12-24 MED ORDER — CEPHALEXIN 250 MG PO CAPS: 250.0000 mg | ORAL_CAPSULE | Freq: Four times a day (QID) | ORAL | 0 refills | Status: AC

## 2016-12-24 MED ORDER — DILTIAZEM HCL 30 MG PO TABS: 30.0000 mg | ORAL_TABLET | Freq: Once | ORAL | Status: DC

## 2016-12-24 MED ORDER — CEFTRIAXONE SODIUM 1 G IJ SOLR
1.0000 g | Freq: Once | INTRAMUSCULAR | Status: AC
  Administered 2016-12-24: 1 g via INTRAVENOUS
  Filled 2016-12-24: qty 10

## 2016-12-24 MED ORDER — DILTIAZEM HCL 25 MG/5ML IV SOLN
10.0000 mg | Freq: Once | INTRAVENOUS | Status: DC
  Filled 2016-12-24: qty 5

## 2016-12-24 MED ORDER — DILTIAZEM HCL 25 MG/5ML IV SOLN
10.0000 mg | Freq: Once | INTRAVENOUS | Status: AC
  Administered 2016-12-24: 10 mg via INTRAVENOUS
  Filled 2016-12-24 (×2): qty 5

## 2016-12-24 MED ORDER — SODIUM CHLORIDE 0.9 % IV SOLN
Freq: Once | INTRAVENOUS | Status: AC
  Administered 2016-12-24: 14:00:00 via INTRAVENOUS

## 2016-12-24 MED ORDER — DILTIAZEM HCL ER COATED BEADS 240 MG PO TB24: 240.0000 mg | ORAL_TABLET | Freq: Every day | ORAL | 6 refills | Status: DC

## 2016-12-24 MED ORDER — LORAZEPAM 2 MG/ML IJ SOLN
0.5000 mg | Freq: Once | INTRAMUSCULAR | Status: AC
  Administered 2016-12-24: 0.5 mg via INTRAVENOUS
  Filled 2016-12-24: qty 1

## 2016-12-24 MED ORDER — DILTIAZEM HCL 25 MG/5ML IV SOLN
10.0000 mg | Freq: Once | INTRAVENOUS | Status: AC
  Administered 2016-12-24: 10 mg via INTRAVENOUS
  Filled 2016-12-24: qty 5

## 2016-12-24 NOTE — ED Notes (Signed)
Pt presents with tachycardia. She constantly tries to get out of bed and pulls at her IV, etc. Family helpful in redirecting pt. Pt alert, not easily redirected.

## 2016-12-24 NOTE — ED Triage Notes (Signed)
[facility], states she was sent by PCP due to tachycardia, pt was there for a follow up apt.. Pt has dementia and constantly wants to walk...constantly has to be redirected.

## 2016-12-24 NOTE — ED Notes (Signed)
Iv 's d'ced   D/c inst to family

## 2016-12-24 NOTE — ED Notes (Signed)
Pt sitting in recliner, family and Recruitment consultantsafety sitter at bedside. NAD.

## 2016-12-24 NOTE — ED Provider Notes (Signed)
[facility] Emergency Department Provider Note       Time seen: ----------------------------------------- 12:08 PM on 12/24/2016 -----------------------------------------  Level V caveat: History/ROS limited by altered mental status   I have reviewed the triage vital signs and the nursing notes.   HISTORY   Chief Complaint Tachycardia    HPI Rebecca Webb is a 83 y.o. female who presents to the ED for tachycardia. Patient comes to the ER with her family from [facility] for tachycardia. She was being seen there for a follow-up appointment. She does have a history of severe dementia and constantly has to be redirected. She presents with her healthcare power of attorney   Past Medical History:  Diagnosis Date  . Atrial fibrillation (HCC)   . Chronic kidney disease   . Dementia   . Dementia   . Depression   . GERD (gastroesophageal reflux disease)   . Hyperlipemia   . Hypertension   . Vitamin D deficiency     Patient Active Problem List   Diagnosis Date Noted  . Agitation 12/05/2015  . Chronic insomnia 12/05/2015  . Pure hypercholesterolemia 08/24/2015  . CKD (chronic kidney disease) stage 3, GFR 30-59 ml/min 08/23/2015  . Depression, major, single episode, complete remission (HCC) 08/23/2015  . Prediabetes 08/23/2015  . Vitamin D deficiency 08/23/2015  . Urinary incontinence in female 08/23/2015  . Alzheimer's dementia 02/22/2015  . Benign essential hypertension 02/22/2015  . Gastroesophageal reflux disease 02/22/2015  . Hyperlipidemia, mixed 02/22/2015  . Shortness of breath 02/22/2015  . A-fib (HCC) 02/18/2015    Past Surgical History:  Procedure Laterality Date  . CATARACT EXTRACTION    . KIDNEY DONATION      Allergies Lisinopril and Haldol [haloperidol lactate]  Social History Social History  Substance Use Topics  . Smoking status: Never Smoker  . Smokeless tobacco: Current User    Types: Snuff  . Alcohol use No     Review of Systems Unknown, patient has a history of severe dementia  ____________________________________________   PHYSICAL EXAM:  VITAL SIGNS: ED Triage Vitals [12/24/16 1203]  Enc Vitals Group     BP      Pulse      Resp      Temp      Temp src      SpO2      Weight 119 lb (54 kg)     Height 5\' 5"  (1.651 m)     Head Circumference      Peak Flow      Pain Score      Pain Loc      Pain Edu?      Excl. in GC?    Constitutional: Alert but disoriented, mildly agitated  Eyes: Conjunctivae are normal.  ENT   Head: Normocephalic and atraumatic.   Nose: No congestion/rhinnorhea.   Mouth/Throat: Mucous membranes are moist.   Neck: No stridor. Cardiovascular: Rapid rate, irregular rhythm. No murmurs, rubs, or gallops. Respiratory: Normal respiratory effort without tachypnea nor retractions. Breath sounds are clear and equal bilaterally. No wheezes/rales/rhonchi. Gastrointestinal: Soft and nontender. Normal bowel sounds Musculoskeletal: Nontender with normal range of motion in extremities.  Neurologic:  No gross focal neurologic deficits are appreciated. Patient ambulates without difficulty Skin:  Skin is warm, dry and intact. No rash noted. Psychiatric: Confused and mildly agitated ____________________________________________  EKG: Interpreted by me. A 2 fibrillation with a rapid ventricular response, rate is 146 bpm, likely septal infarct age indeterminate, normal axis, long QT  ____________________________________________  ED COURSE:  Pertinent labs & imaging results that were available during my care of the patient were reviewed by me and considered in my medical decision making (see chart for details). Patient presents for tachycardia with a history of severe dementia, we will assess with labs and imaging as indicated. Clinical Course as of Dec 24 1357  Wed Dec 24, 2016  1342 Patient does appear somewhat dehydrated and is receiving IV fluids. We are  also giving IV antibiotics to treat for UTI.  [JW]    Clinical Course User Index [JW] Emily FilbertWilliams, Rebecca Webb E, MD   Procedures ____________________________________________   LABS (pertinent positives/negatives)  Labs Reviewed  CBC WITH DIFFERENTIAL/PLATELET - Abnormal; Notable for the following:       Result Value   RDW 14.6 (*)    All other components within normal limits  COMPREHENSIVE METABOLIC PANEL - Abnormal; Notable for the following:    Chloride 116 (*)    CO2 21 (*)    Glucose, Bld 116 (*)    BUN 29 (*)    Creatinine, Ser 1.01 (*)    Calcium 8.7 (*)    Albumin 3.4 (*)    GFR calc non Af Amer 49 (*)    GFR calc Af Amer 57 (*)    All other components within normal limits  URINALYSIS, COMPLETE (UACMP) WITH MICROSCOPIC - Abnormal; Notable for the following:    Color, Urine YELLOW (*)    APPearance CLOUDY (*)    Hgb urine dipstick SMALL (*)    Protein, ur 30 (*)    Leukocytes, UA LARGE (*)    Squamous Epithelial / LPF 6-30 (*)    Non Squamous Epithelial 0-5 (*)    All other components within normal limits  URINE CULTURE  TROPONIN I  ____________________________________________  CRITICAL CARE Performed by: Emily FilbertWilliams, Christeen Lai E   Total critical care time: 30 minutes  Critical care time was exclusive of separately billable procedures and treating other patients.  Critical care was necessary to treat or prevent imminent or life-threatening deterioration.  Critical care was time spent personally by me on the following activities: development of treatment plan with patient and/or surrogate as well as nursing, discussions with consultants, evaluation of patient's response to treatment, examination of patient, obtaining history from patient or surrogate, ordering and performing treatments and interventions, ordering and review of laboratory studies, ordering and review of radiographic studies, pulse oximetry and re-evaluation of patient's condition.   FINAL ASSESSMENT  AND PLAN  Atrial fibrillation with a rapid ventricular response, dehydration, UTI  Plan: Patient's labs and imaging were dictated above. Patient had presented for fast heart rate which is likely multifactorial. She does have history of paroxysmal A. fib. We gave her IV fluids as well as IV Rocephin for UTI. She was also given a one-time dose of Cardizem and we gave her an oral dose of extended release Cardizem. I have discussed the case with her cardiologist who agrees with plan.   Emily FilbertWilliams, Zyaire Dumas E, MD   Note: This note was generated in part or whole with voice recognition software. Voice recognition is usually quite accurate but there are transcription errors that can and very often do occur. I apologize for any typographical errors that were not detected and corrected.     Emily FilbertWilliams, Venetia Prewitt E, MD 12/24/16 [phone]

## 2016-12-24 NOTE — ED Notes (Signed)
Resumed care om valerie rn.  Pt alert.  afib on monitor at 150 .  meds given.  Family with pt.

## 2016-12-26 LAB — URINE CULTURE
CULTURE: NO GROWTH
SPECIAL REQUESTS: NORMAL

## 2017-02-05 ENCOUNTER — Inpatient Hospital Stay
Admission: EM | Admit: 2017-02-05 | Discharge: 2017-02-14 | DRG: 481 | Disposition: A | Discharge status: Home / Self Care | Payer: Medicare Other | Attending: Internal Medicine | Admitting: Internal Medicine

## 2017-02-05 ENCOUNTER — Emergency Department: Payer: Medicare Other

## 2017-02-05 DIAGNOSIS — Z79899 Other long term (current) drug therapy: Secondary | ICD-10-CM

## 2017-02-05 DIAGNOSIS — I4891 Unspecified atrial fibrillation: Secondary | ICD-10-CM | POA: Diagnosis not present

## 2017-02-05 DIAGNOSIS — N183 Chronic kidney disease, stage 3 (moderate): Secondary | ICD-10-CM | POA: Diagnosis present

## 2017-02-05 DIAGNOSIS — F0390 Unspecified dementia without behavioral disturbance: Secondary | ICD-10-CM | POA: Diagnosis present

## 2017-02-05 DIAGNOSIS — E782 Mixed hyperlipidemia: Secondary | ICD-10-CM | POA: Diagnosis present

## 2017-02-05 DIAGNOSIS — R001 Bradycardia, unspecified: Secondary | ICD-10-CM | POA: Diagnosis not present

## 2017-02-05 DIAGNOSIS — F17290 Nicotine dependence, other tobacco product, uncomplicated: Secondary | ICD-10-CM | POA: Diagnosis present

## 2017-02-05 DIAGNOSIS — Z905 Acquired absence of kidney: Secondary | ICD-10-CM | POA: Diagnosis not present

## 2017-02-05 DIAGNOSIS — W1830XA Fall on same level, unspecified, initial encounter: Secondary | ICD-10-CM | POA: Diagnosis present

## 2017-02-05 DIAGNOSIS — S72002A Fracture of unspecified part of neck of left femur, initial encounter for closed fracture: Principal | ICD-10-CM | POA: Diagnosis present

## 2017-02-05 DIAGNOSIS — E876 Hypokalemia: Secondary | ICD-10-CM | POA: Diagnosis present

## 2017-02-05 DIAGNOSIS — I480 Paroxysmal atrial fibrillation: Secondary | ICD-10-CM | POA: Diagnosis not present

## 2017-02-05 DIAGNOSIS — Z7982 Long term (current) use of aspirin: Secondary | ICD-10-CM | POA: Diagnosis not present

## 2017-02-05 DIAGNOSIS — D72829 Elevated white blood cell count, unspecified: Secondary | ICD-10-CM | POA: Diagnosis present

## 2017-02-05 DIAGNOSIS — I959 Hypotension, unspecified: Secondary | ICD-10-CM | POA: Diagnosis not present

## 2017-02-05 DIAGNOSIS — K219 Gastro-esophageal reflux disease without esophagitis: Secondary | ICD-10-CM | POA: Diagnosis present

## 2017-02-05 DIAGNOSIS — I129 Hypertensive chronic kidney disease with stage 1 through stage 4 chronic kidney disease, or unspecified chronic kidney disease: Secondary | ICD-10-CM | POA: Diagnosis present

## 2017-02-05 DIAGNOSIS — E559 Vitamin D deficiency, unspecified: Secondary | ICD-10-CM | POA: Diagnosis present

## 2017-02-05 DIAGNOSIS — W19XXXA Unspecified fall, initial encounter: Secondary | ICD-10-CM

## 2017-02-05 DIAGNOSIS — N179 Acute kidney failure, unspecified: Secondary | ICD-10-CM | POA: Diagnosis present

## 2017-02-05 DIAGNOSIS — R410 Disorientation, unspecified: Secondary | ICD-10-CM | POA: Diagnosis not present

## 2017-02-05 DIAGNOSIS — Z888 Allergy status to other drugs, medicaments and biological substances status: Secondary | ICD-10-CM | POA: Diagnosis not present

## 2017-02-05 DIAGNOSIS — I482 Chronic atrial fibrillation: Secondary | ICD-10-CM | POA: Diagnosis present

## 2017-02-05 DIAGNOSIS — Z419 Encounter for procedure for purposes other than remedying health state, unspecified: Secondary | ICD-10-CM

## 2017-02-05 LAB — CBC WITH DIFFERENTIAL/PLATELET
BASOS ABS: 0 10*3/uL (ref 0–0.1)
Basophils Relative: 0 %
Eosinophils Absolute: 0 10*3/uL (ref 0–0.7)
Eosinophils Relative: 0 %
HEMATOCRIT: 33.1 % — AB (ref 35.0–47.0)
Hemoglobin: 11.2 g/dL — ABNORMAL LOW (ref 12.0–16.0)
LYMPHS PCT: 3 %
Lymphs Abs: 0.6 10*3/uL — ABNORMAL LOW (ref 1.0–3.6)
MCH: 30.9 pg (ref 26.0–34.0)
MCHC: 33.7 g/dL (ref 32.0–36.0)
MCV: 91.5 fL (ref 80.0–100.0)
MONO ABS: 1.1 10*3/uL — AB (ref 0.2–0.9)
MONOS PCT: 6 %
NEUTROS ABS: 17.3 10*3/uL — AB (ref 1.4–6.5)
Neutrophils Relative %: 91 %
Platelets: 329 10*3/uL (ref 150–440)
RBC: 3.62 MIL/uL — ABNORMAL LOW (ref 3.80–5.20)
RDW: 13.9 % (ref 11.5–14.5)
WBC: 19.1 10*3/uL — ABNORMAL HIGH (ref 3.6–11.0)

## 2017-02-05 LAB — PROTIME-INR
INR: 1.17
Prothrombin Time: 14.8 seconds (ref 11.4–15.2)

## 2017-02-05 LAB — BASIC METABOLIC PANEL
ANION GAP: 15 (ref 5–15)
BUN: 58 mg/dL — ABNORMAL HIGH (ref 6–20)
CO2: 23 mmol/L (ref 22–32)
Calcium: 8.5 mg/dL — ABNORMAL LOW (ref 8.9–10.3)
Chloride: 107 mmol/L (ref 101–111)
Creatinine, Ser: 1.39 mg/dL — ABNORMAL HIGH (ref 0.44–1.00)
GFR calc Af Amer: 39 mL/min — ABNORMAL LOW (ref >60)
GFR, EST NON AFRICAN AMERICAN: 33 mL/min — AB (ref >60)
Glucose, Bld: 128 mg/dL — ABNORMAL HIGH (ref 65–99)
POTASSIUM: 3.9 mmol/L (ref 3.5–5.1)
Sodium: 145 mmol/L (ref 135–145)

## 2017-02-05 MED ORDER — ONDANSETRON HCL 4 MG/2ML IJ SOLN: 4.0000 mg | Freq: Four times a day (QID) | INTRAMUSCULAR | Status: DC | PRN

## 2017-02-05 MED ORDER — MORPHINE SULFATE (PF) 2 MG/ML IV SOLN
2.0000 mg | INTRAVENOUS | Status: DC | PRN
  Administered 2017-02-06: 1 mg via INTRAVENOUS
  Administered 2017-02-06 (×2): 2 mg via INTRAVENOUS
  Filled 2017-02-05 (×4): qty 1

## 2017-02-05 MED ORDER — LORAZEPAM 2 MG/ML IJ SOLN
0.5000 mg | Freq: Once | INTRAMUSCULAR | Status: AC
  Administered 2017-02-05: 0.5 mg via INTRAVENOUS
  Filled 2017-02-05: qty 1

## 2017-02-05 MED ORDER — INFLUENZA VAC SPLIT HIGH-DOSE 0.5 ML IM SUSY
0.5000 mL | PREFILLED_SYRINGE | INTRAMUSCULAR | Status: DC
  Filled 2017-02-05: qty 0.5

## 2017-02-05 MED ORDER — HYDROCODONE-ACETAMINOPHEN 5-325 MG PO TABS: 1.0000 | ORAL_TABLET | ORAL | Status: DC | PRN

## 2017-02-05 MED ORDER — DILTIAZEM HCL 100 MG IV SOLR
5.0000 mg/h | INTRAVENOUS | Status: DC
  Administered 2017-02-05: 5 mg/h via INTRAVENOUS
  Filled 2017-02-05: qty 100

## 2017-02-05 MED ORDER — DILTIAZEM LOAD VIA INFUSION
10.0000 mg | Freq: Once | INTRAVENOUS | Status: AC
  Administered 2017-02-05: 10 mg via INTRAVENOUS
  Filled 2017-02-05: qty 10

## 2017-02-05 MED ORDER — METOPROLOL TARTRATE 5 MG/5ML IV SOLN
5.0000 mg | Freq: Once | INTRAVENOUS | Status: AC
  Administered 2017-02-05: 5 mg via INTRAVENOUS
  Filled 2017-02-05: qty 5

## 2017-02-05 MED ORDER — MEMANTINE HCL ER 7 MG PO CP24
21.0000 mg | ORAL_CAPSULE | Freq: Every day | ORAL | Status: DC
  Administered 2017-02-10 – 2017-02-14 (×4): 21 mg via ORAL
  Filled 2017-02-05 (×10): qty 3

## 2017-02-05 MED ORDER — SODIUM CHLORIDE 0.9 % IV BOLUS (SEPSIS)
1000.0000 mL | Freq: Once | INTRAVENOUS | Status: AC
  Administered 2017-02-05: 1000 mL via INTRAVENOUS

## 2017-02-05 MED ORDER — FENTANYL CITRATE (PF) 100 MCG/2ML IJ SOLN
25.0000 ug | Freq: Once | INTRAMUSCULAR | Status: AC
  Administered 2017-02-05: 25 ug via INTRAVENOUS
  Filled 2017-02-05: qty 2

## 2017-02-05 MED ORDER — ACETAMINOPHEN 325 MG PO TABS
650.0000 mg | ORAL_TABLET | Freq: Four times a day (QID) | ORAL | Status: DC | PRN
  Administered 2017-02-13: 650 mg via ORAL
  Filled 2017-02-05: qty 2

## 2017-02-05 MED ORDER — HEPARIN (PORCINE) IN NACL 100-0.45 UNIT/ML-% IJ SOLN: 12.0000 [IU]/kg/h | INTRAMUSCULAR | Status: DC

## 2017-02-05 MED ORDER — DILTIAZEM HCL 25 MG/5ML IV SOLN
15.0000 mg | Freq: Once | INTRAVENOUS | Status: AC
  Administered 2017-02-05: 15 mg via INTRAVENOUS
  Filled 2017-02-05: qty 5

## 2017-02-05 MED ORDER — ACETAMINOPHEN 650 MG RE SUPP: 650.0000 mg | Freq: Four times a day (QID) | RECTAL | Status: DC | PRN

## 2017-02-05 MED ORDER — HEPARIN SODIUM (PORCINE) 5000 UNIT/ML IJ SOLN
5000.0000 [IU] | Freq: Three times a day (TID) | INTRAMUSCULAR | Status: DC
  Administered 2017-02-05 – 2017-02-06 (×2): 5000 [IU] via SUBCUTANEOUS
  Filled 2017-02-05 (×2): qty 1

## 2017-02-05 MED ORDER — METOPROLOL TARTRATE 5 MG/5ML IV SOLN
10.0000 mg | Freq: Once | INTRAVENOUS | Status: AC
  Administered 2017-02-05: 10 mg via INTRAVENOUS
  Filled 2017-02-05: qty 10

## 2017-02-05 MED ORDER — DIPHENHYDRAMINE HCL 50 MG/ML IJ SOLN
25.0000 mg | Freq: Once | INTRAMUSCULAR | Status: AC
  Administered 2017-02-05: 25 mg via INTRAVENOUS
  Filled 2017-02-05: qty 1

## 2017-02-05 MED ORDER — PNEUMOCOCCAL VAC POLYVALENT 25 MCG/0.5ML IJ INJ: 0.5000 mL | INJECTION | INTRAMUSCULAR | Status: DC

## 2017-02-05 MED ORDER — BISACODYL 5 MG PO TBEC: 5.0000 mg | DELAYED_RELEASE_TABLET | Freq: Every day | ORAL | Status: DC | PRN

## 2017-02-05 MED ORDER — DOCUSATE SODIUM 100 MG PO CAPS: 100.0000 mg | ORAL_CAPSULE | Freq: Two times a day (BID) | ORAL | Status: DC

## 2017-02-05 MED ORDER — SODIUM CHLORIDE 0.9 % IV SOLN
INTRAVENOUS | Status: DC
  Administered 2017-02-05 – 2017-02-07 (×3): via INTRAVENOUS

## 2017-02-05 MED ORDER — ONDANSETRON HCL 4 MG PO TABS: 4.0000 mg | ORAL_TABLET | Freq: Four times a day (QID) | ORAL | Status: DC | PRN

## 2017-02-05 NOTE — ED Provider Notes (Signed)
[facility] Emergency Department Provider Note  ____________________________________________  Time seen: Approximately 6:53 PM  I have reviewed the triage vital signs and the nursing notes.   HISTORY  Chief Complaint Fall and Hip Pain (left hip)  Level 5 Caveat: Portions of the History and Physical were unable to be obtained due to altered mental status related to chronic dementia.   HPI Rebecca Webb is a 83 y.o. female brought to the ED due to left leg pain and difficulty walking. The patient had a fall 4 days ago and has been unable to bear weight on the left leg since then. Daughter is at bedside report that they have tried to have the patient walk with a walker but she is just been dragging that leg.been taking all her medications. No fevers chills had injury neck pain chest pain or shortness of breath.     Past Medical History:  Diagnosis Date  . Atrial fibrillation (HCC)   . Chronic kidney disease   . Dementia   . Dementia   . Depression   . GERD (gastroesophageal reflux disease)   . Hyperlipemia   . Hypertension   . Vitamin D deficiency      Patient Active Problem List   Diagnosis Date Noted  . Agitation 12/05/2015  . Chronic insomnia 12/05/2015  . Pure hypercholesterolemia 08/24/2015  . CKD (chronic kidney disease) stage 3, GFR 30-59 ml/min (HCC) 08/23/2015  . Depression, major, single episode, complete remission (HCC) 08/23/2015  . Prediabetes 08/23/2015  . Vitamin D deficiency 08/23/2015  . Urinary incontinence in female 08/23/2015  . Alzheimer's dementia 02/22/2015  . Benign essential hypertension 02/22/2015  . Gastroesophageal reflux disease 02/22/2015  . Hyperlipidemia, mixed 02/22/2015  . Shortness of breath 02/22/2015  . A-fib (HCC) 02/18/2015     Past Surgical History:  Procedure Laterality Date  . CATARACT EXTRACTION    . KIDNEY DONATION       Prior to Admission medications   Medication Sig Start Date End Date  Taking? Authorizing Provider  aspirin EC 81 MG tablet Take by mouth.    [provider]  diltiazem (CARDIZEM LA) 240 MG 24 hr tablet Take 1 tablet (240 mg total) by mouth daily. 12/24/16   Emily Filbert, MD  Memantine HCl ER 21 MG CP24 Take 21 mg by mouth daily. 08/27/16   Brandy Hale, MD  Sennosides-Docusate Sodium (SENEXON-S PO) Take 2 tablets by mouth daily.    [provider]  Vitamin D, Ergocalciferol, (DRISDOL) 50000 UNITS CAPS capsule Take 50,000 Units by mouth every 30 (thirty) days.    [provider]     Allergies Lisinopril and Haldol [haloperidol lactate]   History reviewed. No pertinent family history.  Social History Social History  Substance Use Topics  . Smoking status: Never Smoker  . Smokeless tobacco: Current User    Types: Snuff  . Alcohol use No    Review of Systems  Constitutional:   No fever or chills.  Cardiovascular:   No chest pain Respiratory:   No dyspnea or cough.   Musculoskeletal:   positive left leg pain All other systems reviewed and are negative except as documented above in ROS and HPI.  ____________________________________________   PHYSICAL EXAM:  VITAL SIGNS: ED Triage Vitals  Enc Vitals Group     BP 02/05/17 1641 (!) 123/102     Pulse Rate 02/05/17 1641 80     Resp 02/05/17 1730 (!) 25     Temp 02/05/17 1641 97.7  F (36.5 C)     Temp Source 02/05/17 1641 Axillary     SpO2 02/05/17 1641 99 %     Weight 02/05/17 1647 120 lb (54.4 kg)     Height 02/05/17 1647 5\' 3"  (1.6 m)     Head Circumference --      Peak Flow --      Pain Score --      Pain Loc --      Pain Edu? --      Excl. in GC? --     Vital signs reviewed, nursing assessments reviewed.   Constitutional:   Alert and oriented to self. not in distress. Eyes:   No scleral icterus.  EOMI. No nystagmus. No conjunctival pallor. PERRL. ENT   Head:   Normocephalic and atraumatic.   Nose:   No congestion/rhinnorhea.     Mouth/Throat:   dry mucous membranes, no pharyngeal erythema. No peritonsillar mass.    Neck:   No meningismus. Full ROM. Hematological/Lymphatic/Immunilogical:   No cervical lymphadenopathy. Cardiovascular:   irregularly irregular rhythm, tachycardic. Symmetric bilateral radial and DP pulses.  No murmurs.  Respiratory:   Normal respiratory effort without tachypnea/retractions. Breath sounds are clear and equal bilaterally. No wheezes/rales/rhonchi. Gastrointestinal:   Soft and nontender. Non distended. There is no CVA tenderness.  No rebound, rigidity, or guarding. Genitourinary:   deferred Musculoskeletal:   left leg shortening and external rotation. There is swelling and tenderness of the proximal left femur. Right leg unremarkable. Distal left leg at the knee down unremarkable. Neurologic:   minimal speech, apparently her baseline.  Motor grossly intact.moves toes in the left foot No gross focal neurologic deficits are appreciated.  Skin:    Skin is warm, dry and intact. No rash noted.  No petechiae, purpura, or bullae.  ____________________________________________    LABS (pertinent positives/negatives) (all labs ordered are listed, but only abnormal results are displayed) Labs Reviewed  BASIC METABOLIC PANEL - Abnormal; Notable for the following:       Result Value   Glucose, Bld 128 (*)    BUN 58 (*)    Creatinine, Ser 1.39 (*)    Calcium 8.5 (*)    GFR calc non Af Amer 33 (*)    GFR calc Af Amer 39 (*)    All other components within normal limits  CBC WITH DIFFERENTIAL/PLATELET - Abnormal; Notable for the following:    WBC 19.1 (*)    RBC 3.62 (*)    Hemoglobin 11.2 (*)    HCT 33.1 (*)    Neutro Abs 17.3 (*)    Lymphs Abs 0.6 (*)    Monocytes Absolute 1.1 (*)    All other components within normal limits  PROTIME-INR   ____________________________________________   EKG  interpreted by me Atrial fibrillation rate of 175, normal axis and intervals. Normal QRS and  ST segments. T wave inversions in V5 and V6, likely rate related demand ischemia.  ____________________________________________    RADIOLOGY  Dg Chest 1 View  Result Date: 02/05/2017 CLINICAL DATA:  Fall EXAM: CHEST 1 VIEW COMPARISON:  None. FINDINGS: Cardiomegaly without pulmonary edema. No focal airspace consolidation. There is retrocardiac opacity crossing the midline, suspected to be a hiatal hernia, new from prior studies. IMPRESSION: Cardiomegaly with retrocardiac opacities suspected to be a hiatal hernia. The retrocardiac opacity is new from prior studies. A lateral chest radiograph would be helpful for confirmation. Electronically Signed   By: Deatra RobinsonKevin  Herman M.D.   On: 02/05/2017 18:18    ____________________________________________  PROCEDURES Procedures CRITICAL CARE Performed by: Scotty Court, Mileydi Milsap   Total critical care time: 35 minutes  Critical care time was exclusive of separately billable procedures and treating other patients.  Critical care was necessary to treat or prevent imminent or life-threatening deterioration.  Critical care was time spent personally by me on the following activities: development of treatment plan with patient and/or surrogate as well as nursing, discussions with consultants, evaluation of patient's response to treatment, examination of patient, obtaining history from patient or surrogate, ordering and performing treatments and interventions, ordering and review of laboratory studies, ordering and review of radiographic studies, pulse oximetry and re-evaluation of patient's condition.  ____________________________________________   DIFFERENTIAL DIAGNOSIS  temp fracture, UTI, dehydration, pneumonia CLINICAL IMPRESSION / ASSESSMENT AND PLAN / ED COURSE  Pertinent labs & imaging results that were available during my care of the patient were reviewed by me and considered in my medical decision making (see chart for details).   patient  presents with obvious shortening of left leg after a fall and inability to move it, consistent with hip fracture. We'll obtain x-rays to evaluate. Also found to have rapid ventricular response with her A. fib, heart rate of 180-200. She is on oral diltiazem long-acting, daughter's report compliance with medications. We'll give IV bolus of diltiazem for rate control. Plan to discuss with orthopedics and hospitalist after workup.  Clinical Course as of Feb 05 1922  Thu Feb 05, 2017  1610 Despite being on oral diltiazem, pt had an erythematous reaction to IV diltizem in the arm with the IV.  Will avoid further dilt for now. Still tachy 160-180, will titrate IV metoprolol. Xrays reviewed, show femoral neck fx. Will d/w ortho and admit.   [PS]  1857 Hr still 150. Repeat metoprolol. Ortho pageed.   [PS]  1904 Hr still 150. Metop, fentanyl. Bp stable  [PS]    Clinical Course User Index [PS] Sharman Cheek, MD     ----------------------------------------- 7:23 PM on 02/05/2017 -----------------------------------------  Discuss with her Dr. Hyacinth Meeker, plans to evaluate patient tomorrow for likely surgical management Saturday morning or possibly tomorrow night. case discussed with hospitalist for further management. Continue hydration, continue titrating beta blocker ____________________________________________   FINAL CLINICAL IMPRESSION(S) / ED DIAGNOSES    Final diagnoses:  Fall, initial encounter  Closed fracture of left hip, initial encounter (HCC)  Atrial fibrillation with rapid ventricular response (HCC)  Dementia without behavioral disturbance, unspecified dementia type      New Prescriptions   No medications on file     Portions of this note were generated with dragon dictation software. Dictation errors may occur despite best attempts at proofreading.    Sharman Cheek, MD 02/05/17 [phone]

## 2017-02-05 NOTE — ED Triage Notes (Signed)
Pt arrived via EMS from Home with reports of a fall and left hip pain. Pt family is at the bedside and they said that she fell on Sunday because they had no power in the home and she couldn't see. They also say that she fell 2 wks prior to that. Family says she falls quite often! Pt has a Hx of Dementia and HTN. EMS reported that pt is guarding the left leg and couldn't walk and won't eat. EMS says that the leg is shortened and rotated. Pt is mostly non-verbal but will nod her head. EMS stated that pt will "fight you" if you attempt to touch her leg. VS per EMS WNL.

## 2017-02-05 NOTE — ED Notes (Signed)
Repositioned pt in bed and gave a pillow.

## 2017-02-05 NOTE — H&P (Signed)
[facility] [place] [facility]   PATIENT NAME: Rebecca FlightBirtie Webb    MR#:  [id number]  DATE OF BIRTH:  Aug 04, 1928  DATE OF ADMISSION:  02/05/2017  PRIMARY CARE PHYSICIAN: Leotis ShamesSingh, Jasmine, MD   REQUESTING/REFERRING PHYSICIAN: Dr. Scotty CourtStafford  CHIEF COMPLAINT:left hip pain   Chief Complaint  Patient presents with  . Fall  . Hip Pain    left hip    HISTORY OF PRESENT ILLNESS:  Rebecca Webb  is a 83 y.o. female with a known history  ofdementia had a fall Sunday, brought by family because of severe left hip pain, unable to ambulate. She also found to have atrial fibrillation with RVR with heart rate up to 1 54 bpm.she has dementia but usually walks at home. PAST MEDICAL HISTORY:   Past Medical History:  Diagnosis Date  . Atrial fibrillation (HCC)   . Chronic kidney disease   . Dementia   . Dementia   . Depression   . GERD (gastroesophageal reflux disease)   . Hyperlipemia   . Hypertension   . Vitamin D deficiency     PAST SURGICAL HISTOIRY:   Past Surgical History:  Procedure Laterality Date  . CATARACT EXTRACTION    . KIDNEY DONATION      SOCIAL HISTORY:   Social History  Substance Use Topics  . Smoking status: Never Smoker  . Smokeless tobacco: Current User    Types: Snuff  . Alcohol use No    FAMILY HISTORY:  History reviewed. No pertinent family history.  DRUG ALLERGIES:   Allergies  Allergen Reactions  . Lisinopril Swelling    Tongue and lips was swollen.  . Haldol [Haloperidol Lactate] Rash    REVIEW OF SYSTEMS:  Unable to obtain review of systems because of dementia and also due to pain medicine that she received including fentanyl,  MEDICATIONS AT HOME:   Prior to Admission medications   Medication Sig Start Date End Date Taking? Authorizing Provider  aspirin EC 81 MG tablet Take 81 mg by mouth daily.    Yes [provider]  diltiazem (CARDIZEM LA) 240 MG 24 hr tablet Take 1 tablet (240 mg total) by mouth  daily. Patient taking differently: Take 240 mg by mouth 2 (two) times daily.  12/24/16  Yes Emily FilbertWilliams, Jonathan E, MD  Memantine HCl ER 21 MG CP24 Take 21 mg by mouth daily. 08/27/16  Yes Brandy HaleFaheem, Uzma, MD  Sennosides-Docusate Sodium (SENEXON-S PO) Take 2 tablets by mouth daily.   Yes [provider]  Vitamin D, Ergocalciferol, (DRISDOL) 50000 UNITS CAPS capsule Take 50,000 Units by mouth every 7 (seven) days.    Yes [provider]      VITAL SIGNS:  Blood pressure (!) 120/105, pulse 80, temperature 97.7 F (36.5 C), temperature source Axillary, resp. rate 17, height 5\' 3"  (1.6 m), weight 54.4 kg (120 lb), SpO2 99 %.  PHYSICAL EXAMINATION:  GENERAL:  83 y.o.-year-old patient lying in the bed ,bilateral hand mitts present sleeping at this time. EYES: Pupils equal, round, reactive to lightNo scleral icterus. Extraocular muscles intact.  HEENT: Head atraumatic, normocephalic. Oropharynx and nasopharynx clear.  NECK:  Supple, no jugular venous distention. No thyroid enlargement, no tenderness.  LUNGS: Normal breath sounds bilaterally, no wheezing, rales,rhonchi or crepitation. No use of accessory muscles of respiration.  CARDIOVASCULAR: S1, S2 normal. No murmurs, rubs, or gallops.  ABDOMEN: Soft, nontender, nondistended. Bowel sounds present. No organomegaly or mass.  EXTREMITIES: No pedal edema, cyanosis, or clubbing.  NEUROLOGIC:  unable to do full neurologic exam because of the dementia and sedation  PSYCHIATRIC: dementia.  SKIN: No obvious rash, lesion, or ulcer.   LABORATORY PANEL:   CBC  Recent Labs Lab 02/05/17 1715  WBC 19.1*  HGB 11.2*  HCT 33.1*  PLT 329   ------------------------------------------------------------------------------------------------------------------  Chemistries   Recent Labs Lab 02/05/17 1715  NA 145  K 3.9  CL 107  CO2 23  GLUCOSE 128*  BUN 58*  CREATININE 1.39*  CALCIUM 8.5*    ------------------------------------------------------------------------------------------------------------------  Cardiac Enzymes No results for input(s): TROPONINI in the last 168 hours. ------------------------------------------------------------------------------------------------------------------  RADIOLOGY:  Dg Chest 1 View  Result Date: 02/05/2017 CLINICAL DATA:  Fall EXAM: CHEST 1 VIEW COMPARISON:  None. FINDINGS: Cardiomegaly without pulmonary edema. No focal airspace consolidation. There is retrocardiac opacity crossing the midline, suspected to be a hiatal hernia, new from prior studies. IMPRESSION: Cardiomegaly with retrocardiac opacities suspected to be a hiatal hernia. The retrocardiac opacity is new from prior studies. A lateral chest radiograph would be helpful for confirmation. Electronically Signed   By: Deatra Robinson M.D.   On: 02/05/2017 18:18    EKG:   Orders placed or performed during the hospital encounter of 02/05/17  . ED EKG  . ED EKG  atrial fibrillation with RVR 175 beatsper  Min.  IMPRESSION AND PLAN:   #33./83 year old female patient with all diagnosed dementia had a fall 4 days ago now comes in with difficulty ambulation found to have left hip fracture and she also has atrial fibrillation with RVR with heart rate up to 1 50 bpm. #1 acute left hip fracture; admitted to medical service, consult orthopedics, continue DVT prophylaxis with heparin drip, pain control with IV Dilaudid 2 mg every 6 hours. #2 atrial fibrillation with RVR: Patient has chronic atrial fibrillation, on  Cardizem  Cardizem, aspirin at home, at this time start Cardizem drip, heparin drip. Discussed this with patient's daughter who is her health carepower of attorne/y.consult for cardiology Dr. Gwen Pounds. .#3 leukocytosis:UA. Patient has history of UTI before. #4//mild l acute kidney injury, creatinine 1.39, a month ago it was normal. Continue IV fluids. #5 CODE STATUS full code,  discussed the CODE STATUS with patient's daughter who wants full resuscitation.  All the records are reviewed and case discussed with ED provider. Management plans discussed with the patient, family and they are in agreement.  CODE STATUS: full;  TOTAL TIME TAKING CARE OF THIS PATIENT:55 minutes.    Katha Hamming M.D on 02/05/2017 at 8:10 PM  Between 7am to 6pm - Pager - [phone]  After 6pm go to [email or web address] - password [email or web address]  [facility]  Office  [phone]  CC: Primary care physician; Leotis Shames, MD  Note: This dictation was prepared with Dragon dictation along with smaller phrase technology. Any transcriptional errors that result from this process are unintentional.

## 2017-02-05 NOTE — ED Notes (Signed)
Went to start cardizem drip but was not sent to ED from pharmacy yet.

## 2017-02-06 LAB — CBC
HCT: 31.3 % — ABNORMAL LOW (ref 35.0–47.0)
HEMOGLOBIN: 10.4 g/dL — AB (ref 12.0–16.0)
MCH: 31.4 pg (ref 26.0–34.0)
MCHC: 33.2 g/dL (ref 32.0–36.0)
MCV: 94.4 fL (ref 80.0–100.0)
PLATELETS: 263 10*3/uL (ref 150–440)
RBC: 3.31 MIL/uL — AB (ref 3.80–5.20)
RDW: 14.5 % (ref 11.5–14.5)
WBC: 14.6 10*3/uL — AB (ref 3.6–11.0)

## 2017-02-06 LAB — URINALYSIS, ROUTINE W REFLEX MICROSCOPIC
BILIRUBIN URINE: NEGATIVE
Bacteria, UA: NONE SEEN
GLUCOSE, UA: NEGATIVE mg/dL
HGB URINE DIPSTICK: NEGATIVE
KETONES UR: NEGATIVE mg/dL
LEUKOCYTES UA: NEGATIVE
Nitrite: NEGATIVE
PROTEIN: 30 mg/dL — AB
Specific Gravity, Urine: 1.025 (ref 1.005–1.030)
pH: 5 (ref 5.0–8.0)

## 2017-02-06 LAB — BASIC METABOLIC PANEL
ANION GAP: 9 (ref 5–15)
BUN: 49 mg/dL — ABNORMAL HIGH (ref 6–20)
CALCIUM: 7.5 mg/dL — AB (ref 8.9–10.3)
CHLORIDE: 119 mmol/L — AB (ref 101–111)
CO2: 19 mmol/L — ABNORMAL LOW (ref 22–32)
CREATININE: 1.02 mg/dL — AB (ref 0.44–1.00)
GFR calc non Af Amer: 48 mL/min — ABNORMAL LOW (ref >60)
GFR, EST AFRICAN AMERICAN: 56 mL/min — AB (ref >60)
Glucose, Bld: 109 mg/dL — ABNORMAL HIGH (ref 65–99)
Potassium: 3.3 mmol/L — ABNORMAL LOW (ref 3.5–5.1)
SODIUM: 147 mmol/L — AB (ref 135–145)

## 2017-02-06 LAB — POTASSIUM: POTASSIUM: 3.2 mmol/L — AB (ref 3.5–5.1)

## 2017-02-06 LAB — GLUCOSE, CAPILLARY
Glucose-Capillary: 103 mg/dL — ABNORMAL HIGH (ref 65–99)
Glucose-Capillary: 89 mg/dL (ref 65–99)

## 2017-02-06 LAB — MAGNESIUM
MAGNESIUM: 2.3 mg/dL (ref 1.7–2.4)
Magnesium: 2.3 mg/dL (ref 1.7–2.4)

## 2017-02-06 LAB — HEPARIN LEVEL (UNFRACTIONATED): Heparin Unfractionated: 0.2 IU/mL — ABNORMAL LOW (ref 0.30–0.70)

## 2017-02-06 LAB — MRSA PCR SCREENING: MRSA by PCR: NEGATIVE

## 2017-02-06 LAB — PHOSPHORUS: Phosphorus: 3.3 mg/dL (ref 2.5–4.6)

## 2017-02-06 MED ORDER — ORAL CARE MOUTH RINSE
15.0000 mL | Freq: Two times a day (BID) | OROMUCOSAL | Status: DC
  Administered 2017-02-06: 15 mL via OROMUCOSAL

## 2017-02-06 MED ORDER — DIGOXIN 0.25 MG/ML IJ SOLN
0.1250 mg | Freq: Once | INTRAMUSCULAR | Status: AC
  Administered 2017-02-06: 0.125 mg via INTRAVENOUS
  Filled 2017-02-06: qty 0.5

## 2017-02-06 MED ORDER — POTASSIUM CHLORIDE 10 MEQ/100ML IV SOLN
10.0000 meq | INTRAVENOUS | Status: AC
  Administered 2017-02-06 – 2017-02-07 (×4): 10 meq via INTRAVENOUS
  Filled 2017-02-06 (×4): qty 100

## 2017-02-06 MED ORDER — HEPARIN (PORCINE) IN NACL 100-0.45 UNIT/ML-% IJ SOLN
800.0000 [IU]/h | INTRAMUSCULAR | Status: DC
  Administered 2017-02-06: 700 [IU]/h via INTRAVENOUS
  Filled 2017-02-06: qty 250

## 2017-02-06 MED ORDER — MORPHINE SULFATE (PF) 2 MG/ML IV SOLN
2.0000 mg | INTRAVENOUS | Status: DC | PRN
  Administered 2017-02-06 – 2017-02-07 (×3): 2 mg via INTRAVENOUS
  Filled 2017-02-06 (×3): qty 1

## 2017-02-06 MED ORDER — SENNOSIDES-DOCUSATE SODIUM 8.6-50 MG PO TABS
2.0000 | ORAL_TABLET | Freq: Every day | ORAL | Status: DC
  Administered 2017-02-07 – 2017-02-13 (×5): 2 via ORAL
  Filled 2017-02-06 (×5): qty 2

## 2017-02-06 MED ORDER — DEXMEDETOMIDINE HCL IN NACL 400 MCG/100ML IV SOLN
0.0000 ug/kg/h | INTRAVENOUS | Status: DC
  Administered 2017-02-06: 0.4 ug/kg/h via INTRAVENOUS
  Filled 2017-02-06: qty 100

## 2017-02-06 MED ORDER — POTASSIUM CHLORIDE CRYS ER 20 MEQ PO TBCR
40.0000 meq | EXTENDED_RELEASE_TABLET | ORAL | Status: DC
  Administered 2017-02-06: 40 meq via ORAL
  Filled 2017-02-06: qty 2

## 2017-02-06 MED ORDER — HEPARIN BOLUS VIA INFUSION
2500.0000 [IU] | Freq: Once | INTRAVENOUS | Status: AC
Start: 2017-02-06 — End: 2017-02-06
  Administered 2017-02-06: 2500 [IU] via INTRAVENOUS
  Filled 2017-02-06: qty 2500

## 2017-02-06 MED ORDER — HEPARIN BOLUS VIA INFUSION
750.0000 [IU] | Freq: Once | INTRAVENOUS | Status: AC
Start: 2017-02-06 — End: 2017-02-06
  Administered 2017-02-06: 750 [IU] via INTRAVENOUS
  Filled 2017-02-06: qty 750

## 2017-02-06 MED ORDER — SODIUM CHLORIDE 0.9 % IV BOLUS (SEPSIS)
1000.0000 mL | Freq: Once | INTRAVENOUS | Status: AC
  Administered 2017-02-06: 1000 mL via INTRAVENOUS

## 2017-02-06 MED ORDER — AMIODARONE HCL IN DEXTROSE 360-4.14 MG/200ML-% IV SOLN
60.0000 mg/h | INTRAVENOUS | Status: AC
  Administered 2017-02-06: 60 mg/h via INTRAVENOUS
  Filled 2017-02-06: qty 200

## 2017-02-06 MED ORDER — DEXTROSE 5 % IV SOLN
5.0000 mg/h | INTRAVENOUS | Status: DC
  Administered 2017-02-06: 15 mg/h via INTRAVENOUS
  Administered 2017-02-06: 5 mg/h via INTRAVENOUS
  Administered 2017-02-07: 10 mg/h via INTRAVENOUS
  Filled 2017-02-06 (×3): qty 100

## 2017-02-06 MED ORDER — AMIODARONE HCL IN DEXTROSE 360-4.14 MG/200ML-% IV SOLN
60.0000 mg/h | INTRAVENOUS | Status: DC
  Administered 2017-02-06: 30 mg/h via INTRAVENOUS
  Administered 2017-02-07 – 2017-02-08 (×6): 60 mg/h via INTRAVENOUS
  Filled 2017-02-06 (×7): qty 200

## 2017-02-06 NOTE — Progress Notes (Signed)
ANTICOAGULATION CONSULT NOTE - Follow Up Consult  Pharmacy Consult for Heparin  Indication: atrial fibrillation  Allergies  Allergen Reactions  . Lisinopril Swelling    Tongue and lips was swollen.  . Haldol [Haloperidol Lactate] Rash    Patient Measurements: Height: 5\' 2"  (157.5 cm) Weight: 134 lb 6.4 oz (61 kg) IBW/kg (Calculated) : 50.1 Heparin Dosing Weight: 50.3 kg   Vital Signs: Temp: 98.1 F (36.7 C) (10/19 1308) Temp Source: Axillary (10/19 1308) BP: 84/57 (10/19 1800) Pulse Rate: 136 (10/19 1800)  Labs:  Recent Labs  02/05/17 1715 02/05/17 1800 02/06/17 0440 02/06/17 2247  HGB 11.2*  --  10.4*  --   HCT 33.1*  --  31.3*  --   PLT 329  --  263  --   LABPROT  --  14.8  --   --   INR  --  1.17  --   --   HEPARINUNFRC  --   --   --  0.20*  CREATININE 1.39*  --  1.02*  --     Estimated Creatinine Clearance: 34.1 mL/min (A) (by C-G formula based on SCr of 1.02 mg/dL (H)).   Medications:  Prescriptions Prior to Admission  Medication Sig Dispense Refill Last Dose  . aspirin EC 81 MG tablet Take 81 mg by mouth daily.    02/05/2017 at 0800  . diltiazem (CARDIZEM LA) 240 MG 24 hr tablet Take 1 tablet (240 mg total) by mouth daily. (Patient taking differently: Take 240 mg by mouth 2 (two) times daily. ) 30 tablet 6 02/05/2017 at 0800  . Memantine HCl ER 21 MG CP24 Take 21 mg by mouth daily. 30 capsule 2 02/05/2017 at 0800  . Sennosides-Docusate Sodium (SENEXON-S PO) Take 2 tablets by mouth daily.   02/05/2017 at Unknown time  . Vitamin D, Ergocalciferol, (DRISDOL) 50000 UNITS CAPS capsule Take 50,000 Units by mouth every 7 (seven) days.    Past Week at Unknown time    Assessment: 10/19:  HL @ 22:47 = 0.2  Goal of Therapy:  Heparin level 0.3-0.7 units/ml Monitor platelets by anticoagulation protocol: Yes   Plan:  Will order Heparin 750 units IV X 1 and increase drip rate to 800 units/hr. Will recheck HL 8 hrs after rate change.   Anthonyjames Bargar  D 02/06/2017,11:36 PM

## 2017-02-06 NOTE — Consult Note (Signed)
ANTICOAGULATION CONSULT NOTE - Initial Consult  Pharmacy Consult for heparin drip Indication: atrial fibrillation  Allergies  Allergen Reactions  . Lisinopril Swelling    Tongue and lips was swollen.  . Haldol [Haloperidol Lactate] Rash    Patient Measurements: Height: 5\' 2"  (157.5 cm) Weight: 134 lb 6.4 oz (61 kg) IBW/kg (Calculated) : 50.1 Heparin Dosing Weight: 50.3kg  Vital Signs: Temp: 97 F (36.1 C) (10/19 0800) Temp Source: Axillary (10/19 0800) BP: 118/78 (10/19 0800) Pulse Rate: 145 (10/19 0800)  Labs:  Recent Labs  02/05/17 1715 02/05/17 1800 02/06/17 0440  HGB 11.2*  --  10.4*  HCT 33.1*  --  31.3*  PLT 329  --  263  LABPROT  --  14.8  --   INR  --  1.17  --   CREATININE 1.39*  --  1.02*    Estimated Creatinine Clearance: 34.1 mL/min (A) (by C-G formula based on SCr of 1.02 mg/dL (H)).   Medical History: Past Medical History:  Diagnosis Date  . Atrial fibrillation (HCC)   . Chronic kidney disease   . Dementia   . Dementia   . Depression   . GERD (gastroesophageal reflux disease)   . Hyperlipemia   . Hypertension   . Vitamin D deficiency     Medications:  Scheduled:  . docusate sodium  100 mg Oral BID  . heparin  2,500 Units Intravenous Once  . Influenza vac split quadrivalent PF  0.5 mL Intramuscular Tomorrow-1000  . memantine  21 mg Oral Daily  . pneumococcal 23 valent vaccine  0.5 mL Intramuscular Tomorrow-1000  . potassium chloride  40 mEq Oral Q4H    Assessment: Pt is a 83 year old female who presents after a fall, found to be in Afib RVR. Pt w/ hx of afib, not on anticoagulation due to fall risk. Pharmacy consulted to start heparin drip on patient.   Goal of Therapy:  Heparin level 0.3-0.7 units/ml Monitor platelets by anticoagulation protocol: Yes   Plan:  Give 2500 units bolus x 1 Start heparin infusion at 700 units/hr Check anti-Xa level in 8 hours and daily while on heparin Continue to monitor H&H and  platelets  Allien Melberg D Makenzee Choudhry, Pharm.D, BCPS Clinical Pharmacist  02/06/2017,12:08 PM

## 2017-02-06 NOTE — Progress Notes (Signed)
Initial Nutrition Assessment  DOCUMENTATION CODES:   Severe malnutrition in context of chronic illness  INTERVENTION:  1. Monitor for needs following diet advancement, unsure of patient's usual appetite or PO intake  NUTRITION DIAGNOSIS:   Malnutrition related to chronic illness as evidenced by severe depletion of muscle mass, severe depletion of body fat.  GOAL:   Patient will meet greater than or equal to 90% of their needs  MONITOR:   I & O's, Labs, Diet advancement, Skin, Weight trends  REASON FOR ASSESSMENT:   Low Braden    ASSESSMENT:   Rebecca Webb is a 83 y.o. female who complains of  Left hip pain after a fall at home several days ago.  She lives with her daughter.  She has dementia.  She apparently fell late Sunday night. Left hip fx, will undergo surgery tomorrow per MD note.  No family at bedside. Patient agitated during visit, trying to take her clothes off.  Nutrition-Focused physical exam completed. Findings are severe fat depletion, severe muscle depletion, and no edema.  Weight fluctuations per chart. Monitor for needs.  Labs reviewed:  Na 147, K 3.3, BUN 49/Creatinine 1.02  Medications reviewed and include:  40 K+ every 4 hours, Senokot-S  NS at 175mL/hr Amiodarone gtt Cardizem gtt  Diet Order:  Diet NPO time specified  Skin:  Reviewed, no issues  Last BM:  02/05/2017  Height:   Ht Readings from Last 1 Encounters:  02/05/17 5\' 2"  (1.575 m)    Weight:   Wt Readings from Last 1 Encounters:  02/06/17 134 lb 6.4 oz (61 kg)    Ideal Body Weight:  50 kg  BMI:  Body mass index is 24.58 kg/m.  Estimated Nutritional Needs:   Kcal:  [id number] calories (MSJ x1.3-1.5)  Protein:  93-105 grams (1.5-1.7g/kg)  Fluid:  >1.5L  EDUCATION NEEDS:   Education needs no appropriate at this time  Rebecca AnoWilliam M. Anniece Bleiler, MS, RD LDN Inpatient Clinical Dietitian Pager [phone]

## 2017-02-06 NOTE — Progress Notes (Signed)
Afib with RVR continues on telemetry.  DR Sheryle Hailiamond made aware.

## 2017-02-06 NOTE — Consult Note (Signed)
[facility] Consultation Note  Patient ID: KARNISHA LEFEBRE, MRN: [id number], DOB/AGE: Aug 17, 1928 83 y.o. Admit date: 02/05/2017   Date of Consult: 02/06/2017 Primary Physician: Leotis Shames, MD Primary Cardiologist: Gwen Pounds  Chief Complaint:  Chief Complaint  Patient presents with  . Fall  . Hip Pain    left hip   Reason for Consult: HPI    Hip Pain   Additional comments: left hip     Last edited by Larence Penning, RN on 02/05/2017  4:39 PM. (History)    atrial fibrillation  HPI: 83 y.o. female with known essential hypertension mixed hyperlipidemia dementia chronic kidney disease stage III having recent fall with significant hip pain and probable fracture of for which the patient is unable to ambulate. The patient was seen in the emergency room for further treatment options and has noticed that the patient was having atrial fibrillation with rapid ventricular rate. His nonvalvular atrial fibrillation did not cause any hypotension or other significant heart failure or myocardial infarction. The patient was placed on amiodarone drip due to concerns of ease of oral intake and has had some improvements of heart rate control 230 bpm. There is still further infusion and would continue this medication management. Other medication management may include diltiazem if necessary. Currently the patient is asymptomatic with normal hemodynamics and not requiring further adjustments  Past Medical History:  Diagnosis Date  . Atrial fibrillation (HCC)   . Chronic kidney disease   . Dementia   . Dementia   . Depression   . GERD (gastroesophageal reflux disease)   . Hyperlipemia   . Hypertension   . Vitamin D deficiency       Surgical History:  Past Surgical History:  Procedure Laterality Date  . CATARACT EXTRACTION    . KIDNEY DONATION       Home Meds: Prior to Admission medications   Medication Sig Start Date End Date Taking? Authorizing Provider  aspirin EC 81 MG tablet  Take 81 mg by mouth daily.    Yes [provider]  diltiazem (CARDIZEM LA) 240 MG 24 hr tablet Take 1 tablet (240 mg total) by mouth daily. Patient taking differently: Take 240 mg by mouth 2 (two) times daily.  12/24/16  Yes Emily Filbert, MD  Memantine HCl ER 21 MG CP24 Take 21 mg by mouth daily. 08/27/16  Yes Brandy Hale, MD  Sennosides-Docusate Sodium (SENEXON-S PO) Take 2 tablets by mouth daily.   Yes [provider]  Vitamin D, Ergocalciferol, (DRISDOL) 50000 UNITS CAPS capsule Take 50,000 Units by mouth every 7 (seven) days.    Yes [provider]    Inpatient Medications:  . docusate sodium  100 mg Oral BID  . heparin  5,000 Units Subcutaneous Q8H  . Influenza vac split quadrivalent PF  0.5 mL Intramuscular Tomorrow-1000  . memantine  21 mg Oral Daily  . pneumococcal 23 valent vaccine  0.5 mL Intramuscular Tomorrow-1000  . potassium chloride  40 mEq Oral Q4H   . sodium chloride 75 mL/hr at 02/06/17 0426  . amiodarone 60 mg/hr (02/06/17 0555)  . amiodarone      Allergies:  Allergies  Allergen Reactions  . Lisinopril Swelling    Tongue and lips was swollen.  . Haldol [Haloperidol Lactate] Rash    Social History   Social History  . Marital status: Widowed    Spouse name: N/A  . Number of children: N/A  . Years of education: N/A   Occupational History  .  Not on file.   Social History Main Topics  . Smoking status: Never Smoker  . Smokeless tobacco: Current User    Types: Snuff  . Alcohol use No  . Drug use: No  . Sexual activity: Not Currently   Other Topics Concern  . Not on file   Social History Narrative  . No narrative on file     History reviewed. No pertinent family history.   Review of Systems Unable to assess Labs: No results for input(s): CKTOTAL, CKMB, TROPONINI in the last 72 hours. Lab Results  Component Value Date   WBC 14.6 (H) 02/06/2017   HGB 10.4 (L) 02/06/2017   HCT 31.3 (L) 02/06/2017   MCV 94.4  02/06/2017   PLT 263 02/06/2017    Recent Labs Lab 02/06/17 0440  NA 147*  K 3.3*  CL 119*  CO2 19*  BUN 49*  CREATININE 1.02*  CALCIUM 7.5*  GLUCOSE 109*   No results found for: CHOL, HDL, LDLCALC, TRIG No results found for: DDIMER  Radiology/Studies:  Dg Chest 1 View  Result Date: 02/05/2017 CLINICAL DATA:  Fall EXAM: CHEST 1 VIEW COMPARISON:  None. FINDINGS: Cardiomegaly without pulmonary edema. No focal airspace consolidation. There is retrocardiac opacity crossing the midline, suspected to be a hiatal hernia, new from prior studies. IMPRESSION: Cardiomegaly with retrocardiac opacities suspected to be a hiatal hernia. The retrocardiac opacity is new from prior studies. A lateral chest radiograph would be helpful for confirmation. Electronically Signed   By: Deatra Robinson M.D.   On: 02/05/2017 18:18   Dg Hip Unilat W Or Wo Pelvis 2-3 Views Left  Result Date: 02/05/2017 CLINICAL DATA:  Fall with left hip pain. EXAM: DG HIP (WITH OR WITHOUT PELVIS) 2-3V LEFT COMPARISON:  None. FINDINGS: AP pelvis shows rightward rotation. There is diffuse bony demineralization. AP and frog-leg lateral views of the right hip show a complex femoral neck fracture with varus angulation. Fracture line in the proximal component containing the femoral head is curved and appears to have some remodeling along the fracture line. IMPRESSION: 1. Left femoral neck fracture with varus angulation. Imaging features are not entirely consistent with acute fracture and this may be an old fracture with bony remodeling. Pathologic fracture would also be a consideration. 2. Probable heterotopic ossification in the adjacent soft tissues suggesting nonacute injury. Electronically Signed   By: Kennith Center M.D.   On: 02/05/2017 18:23    EKG: atrial fibrillation with rapid ventricular rate and nonspecific ST and T-wave changes  Weights: Filed Weights   02/05/17 1648 02/05/17 2132 02/06/17 0542  Weight: 54.4 kg (120 lb)  50.3 kg (111 lb) 61 kg (134 lb 6.4 oz)     Physical Exam: Blood pressure 118/78, pulse (!) 145, temperature (!) 97 F (36.1 C), temperature source Axillary, resp. rate 20, height 5\' 2"  (1.575 m), weight 61 kg (134 lb 6.4 oz), SpO2 93 %. Body mass index is 24.58 kg/m. General: Well developed, poorly nourished, in no acute distress. Head eyes ears nose throat: Normocephalic, atraumatic, sclera non-icteric, no xanthomas, nares are without discharge. No apparent thyromegaly and/or mass  Lungs: Normal respiratory effort.  no wheezes, no rales, no rhonchi.  Heart: irregular with normal S1 S2. no murmur gallop, no rub, PMI is normal size and placement, carotid upstroke normal without bruit, jugular venous pressure is normal Abdomen: Soft, non-tender, non-distended with normoactive bowel sounds. No hepatomegaly. No rebound/guarding. No obvious abdominal masses. Abdominal aorta is normal size without bruit Extremities: trace edema. no  cyanosis, no clubbing, no ulcers  Peripheral : 2+ bilateral upper extremity pulses, 2+ bilateral femoral pulses, 2+ bilateral dorsal pedal pulse Neuro: notAlert and oriented. No facial asymmetry. No focal deficit. Moves all extremities spontaneously. Musculoskeletal: Normal muscle tone without kyphosis Psych:  Does notResponds to questions appropriately with a normal affect.    Assessment: 83 year old female with dementia essential hypertension makes hyperlipidemia chronic kidney disease stage III having acute nonvalvular atrial fibrillation with rapid ventricular rate without evidence of myocardial infarction or congestive heart failure  Plan: 1. Continue heparin for further risk reduction in stroke and deep venous thrombosis and pulmonary embolism 2. Amiodarone drip until patient has better heart rate control and possible spontaneous conversion to normal sinus rhythm 3. Begin diltiazem drip if necessary for better heart rate control with a goal heart rate below 110  bpm 4. Further intervention from a hip procedures if necessary 5. No further cardiac diagnostics necessary at this time  Signed, Lamar BlinksBruce J Sherisse Fullilove M.D. [facility]ACC [facility] 02/06/2017, 8:32 AM

## 2017-02-06 NOTE — Progress Notes (Signed)
Dr Gwen PoundsKowalski made aware of sustaining HR 130-140s Cardizem at 15mg , Amio now at 60mg , heparin initiated since 1230 today. Pt unable to take PO. No new orders at this time.

## 2017-02-06 NOTE — Progress Notes (Signed)
Contacted Patient Placement about pending transfer. Spoke with Shawn at 1227pm about transfer. Noted request sent to ICU for approval.

## 2017-02-06 NOTE — Progress Notes (Signed)
[facility] [place] [facility]   PATIENT NAME: Rebecca Webb    MR#:  [id number]  DATE OF BIRTH:  October 12, 1928  SUBJECTIVE:  Sleepy. Unable to provide any history or review of system. Granddaughter in the room give history. Patient probably fell about 4 days ago has underlying dementia significant. Difficulty walking and pain brought to the emergency room found to have hip fracture.  REVIEW OF SYSTEMS:   Review of Systems  Unable to perform ROS: Dementia   Tolerating Diet: Tolerating PT:   DRUG ALLERGIES:   Allergies  Allergen Reactions  . Lisinopril Swelling    Tongue and lips was swollen.  . Haldol [Haloperidol Lactate] Rash    VITALS:  Blood pressure 118/78, pulse (!) 145, temperature (!) 97 F (36.1 C), temperature source Axillary, resp. rate 20, height 5\' 2"  (1.575 m), weight 61 kg (134 lb 6.4 oz), SpO2 93 %.  PHYSICAL EXAMINATION:   Physical Exam  GENERAL:  83 y.o.-year-old patient lying in the bed with no acute distress. thin EYES: Pupils equal, round, reactive to light and accommodation. No scleral icterus. Extraocular muscles intact.  HEENT: Head atraumatic, normocephalic. Oropharynx and nasopharynx clear.  NECK:  Supple, no jugular venous distention. No thyroid enlargement, no tenderness.  LUNGS: Normal breath sounds bilaterally, no wheezing, rales, rhonchi. No use of accessory muscles of respiration.  CARDIOVASCULAR: S1, S2 normal. No murmurs, rubs, or gallops.  ABDOMEN: Soft, nontender, nondistended. Bowel sounds present. No organomegaly or mass.  EXTREMITIES: No cyanosis, clubbing or edema b/l.   [place]ucks traction left leg NEUROLOGIC: unable to assess PSYCHIATRIC:sleepy SKIN: No obvious rash, lesion, or ulcer.   LABORATORY PANEL:  CBC  Recent Labs Lab 02/06/17 0440  WBC 14.6*  HGB 10.4*  HCT 31.3*  PLT 263    Chemistries   Recent Labs Lab 02/06/17 0440  NA 147*  K 3.3*  CL 119*  CO2 19*  GLUCOSE 109*  BUN 49*   CREATININE 1.02*  CALCIUM 7.5*   Cardiac Enzymes No results for input(s): TROPONINI in the last 168 hours. RADIOLOGY:  Dg Chest 1 View  Result Date: 02/05/2017 CLINICAL DATA:  Fall EXAM: CHEST 1 VIEW COMPARISON:  None. FINDINGS: Cardiomegaly without pulmonary edema. No focal airspace consolidation. There is retrocardiac opacity crossing the midline, suspected to be a hiatal hernia, new from prior studies. IMPRESSION: Cardiomegaly with retrocardiac opacities suspected to be a hiatal hernia. The retrocardiac opacity is new from prior studies. A lateral chest radiograph would be helpful for confirmation. Electronically Signed   By: Deatra RobinsonKevin  Herman M.D.   On: 02/05/2017 18:18   Dg Hip Unilat W Or Wo Pelvis 2-3 Views Left  Result Date: 02/05/2017 CLINICAL DATA:  Fall with left hip pain. EXAM: DG HIP (WITH OR WITHOUT PELVIS) 2-3V LEFT COMPARISON:  None. FINDINGS: AP pelvis shows rightward rotation. There is diffuse bony demineralization. AP and frog-leg lateral views of the right hip show a complex femoral neck fracture with varus angulation. Fracture line in the proximal component containing the femoral head is curved and appears to have some remodeling along the fracture line. IMPRESSION: 1. Left femoral neck fracture with varus angulation. Imaging features are not entirely consistent with acute fracture and this may be an old fracture with bony remodeling. Pathologic fracture would also be a consideration. 2. Probable heterotopic ossification in the adjacent soft tissues suggesting nonacute injury. Electronically Signed   By: Kennith CenterEric  Mansell M.D.   On: 02/05/2017 18:23   ASSESSMENT AND PLAN:  83  y.o. female with known essential hypertension mixed hyperlipidemia dementia chronic kidney disease stage III having recent fall with significant hip pain and probable fracture of for which the patient is unable to ambulate. The patient was seen in the emergency room for further treatment options and has noticed  that the patient was having atrial fibrillation with rapid ventricular rate  #1 acute left hip fracture After mechanical fall at home 4 days ago -consult orthopedics--pending  - pain control with IV Dilaudid 2 mg every 6 hours.  #2 atrial fibrillation with RVR: Patient has chronic atrial fibrillation, on  Cardizem   -on IV Amiodarone gtt -IV cardizem gtt to be started--- transfer to ICU spoke with charge nurse and Annabelle Harman, NP -?IV heparin gtt needs to be started given rapid afib. Pharamcy to d/w dr Kirtland Bouchard  .#3 leukocytosis:UA. Patient has history of UTI before.  #4//mild  acute kidney injury, creatinine 1.39, a month ago creat was normal. Continue IV fluids.  #5 CODE STATUS full code, discussed the CODE STATUS with patient's granddaughter Rebecca Webb who wants full resuscitation. She says she is the Legal guardian and has paper work given to be placed in pt's chart  Case discussed with Care Management/Social Worker. Management plans discussed with the family and they are in agreement.  CODE STATUS FULL per grand dter Rebecca Webb  DVT Prophylaxis: SCD, TEDS and heparin gtt  TOTAL TIME TAKING CARE OF THIS PATIENT: 30 minutes.  >50% time spent on counselling and coordination of care  POSSIBLE D/C IN *2-3* DAYS, DEPENDING ON CLINICAL CONDITION.  Note: This dictation was prepared with Dragon dictation along with smaller phrase technology. Any transcriptional errors that result from this process are unintentional.  Atwood Adcock M.D on 02/06/2017 at 11:56 AM  Between 7am to 6pm - Pager - [phone]  After 6pm go to [email or web address] - Social research officer, government  [facility]  Office  [phone]  CC: Primary care physician; Leotis Shames, MDPatient ID: Rebecca Webb, female   DOB: 05-16-1928, 83 y.o.   MRN: [id number]

## 2017-02-06 NOTE — Consult Note (Signed)
Name: Rebecca Webb MRN: [id number] DOB: [birth year]    ADMISSION DATE:  02/05/2017 CONSULTATION DATE: 02/06/2017  REFERRING MD : Dr. Allena Katz   CHIEF COMPLAINT: Atrial Fibrillation with RVR   BRIEF PATIENT DESCRIPTION:  83 yo female admitted to telemetry unit 10/18 with left hip fracture following a fall at home and afibb with rvr transferred to the stepdown unit 10/19 due to worsening afibb with rvr requiring amiodarone and cardizem gtts  SIGNIFICANT EVENTS  10/18-Pt admitted to the medsurg unit  10/19-Pt transferred to stepdown unit   STUDIES:  None  HISTORY OF PRESENT ILLNESS:   This is an 83 yo female with a PMH of Vitamin D Deficiency, HTN, Hyperlipidemia, GERD, Depression, Dementia, CKD, and Atrial Fibrillation.  She presented to [facility] ER 10/18 with left leg pain and difficulty walking following a fall at home.  Per ER notes she had a fall 4 days prior to presentation to the ER and was unable to bear weight on her left leg since the fall. In the ER hip xray revealed left femoral neck fracture unclear if old vs.acute fracture. An EKG revealed she was in atrial fibrillation with rvr heart rate 175, therefore a cardizem gtt was initiated.  She was subsequently admitted to the telemetry unit by hospitalist team for further workup and treatment.  Due to hypotension secondary to cardizem gtt she was transitioned to amiodarone gtt on 10/19, however she remained in afibb with rvr requiring transfer to the stepdown unit for initiation of both cardizem and amiodarone gtts.  PAST MEDICAL HISTORY :   has a past medical history of Atrial fibrillation (HCC); Chronic kidney disease; Dementia; Dementia; Depression; GERD (gastroesophageal reflux disease); Hyperlipemia; Hypertension; and Vitamin D deficiency.  has a past surgical history that includes Cataract extraction and Kidney donation. Prior to Admission medications   Medication Sig Start Date End Date Taking? Authorizing Provider  aspirin EC 81  MG tablet Take 81 mg by mouth daily.    Yes [provider]  diltiazem (CARDIZEM LA) 240 MG 24 hr tablet Take 1 tablet (240 mg total) by mouth daily. Patient taking differently: Take 240 mg by mouth 2 (two) times daily.  12/24/16  Yes Emily Filbert, MD  Memantine HCl ER 21 MG CP24 Take 21 mg by mouth daily. 08/27/16  Yes Brandy Hale, MD  Sennosides-Docusate Sodium (SENEXON-S PO) Take 2 tablets by mouth daily.   Yes [provider]  Vitamin D, Ergocalciferol, (DRISDOL) 50000 UNITS CAPS capsule Take 50,000 Units by mouth every 7 (seven) days.    Yes [provider]   Allergies  Allergen Reactions  . Lisinopril Swelling    Tongue and lips was swollen.  . Haldol [Haloperidol Lactate] Rash    FAMILY HISTORY:  family history is not on file. SOCIAL HISTORY:  reports that she has never smoked. Her smokeless tobacco use includes Snuff. She reports that she does not drink alcohol or use drugs.  REVIEW OF SYSTEMS:   Unable to assess due to dementia   SUBJECTIVE:  Pt confused and agitated  VITAL SIGNS: Temp:  [97 F (36.1 C)-97.7 F (36.5 C)] 97 F (36.1 C) (10/19 0800) Pulse Rate:  [68-163] 145 (10/19 0800) Resp:  [17-28] 20 (10/19 0800) BP: (83-138)/(52-111) 118/78 (10/19 0800) SpO2:  [93 %-100 %] 93 % (10/19 0800) Weight:  [50.3 kg (111 lb)-61 kg (134 lb 6.4 oz)] 61 kg (134 lb 6.4 oz) (10/19 0542)  PHYSICAL EXAMINATION: General: frail elderly female, agitated NAD  Neuro: confused,  agitated not following commands, PERRL  HEENT: supple, no JVD Cardiovascular: irregular, irregular, no M/R/G Lungs: shallow respirations diminished throughout, even, non labored  Abdomen: +BS x4, soft, non distended  Musculoskeletal: left lower extremity brace in bucks traction, moves all extremities  Skin: intact no rashes or lesions    Recent Labs Lab 02/05/17 1715 02/06/17 0440  NA 145 147*  K 3.9 3.3*  CL 107 119*  CO2 23 19*  BUN 58* 49*  CREATININE 1.39*  1.02*  GLUCOSE 128* 109*    Recent Labs Lab 02/05/17 1715 02/06/17 0440  HGB 11.2* 10.4*  HCT 33.1* 31.3*  WBC 19.1* 14.6*  PLT 329 263   Dg Chest 1 View  Result Date: 02/05/2017 CLINICAL DATA:  Fall EXAM: CHEST 1 VIEW COMPARISON:  None. FINDINGS: Cardiomegaly without pulmonary edema. No focal airspace consolidation. There is retrocardiac opacity crossing the midline, suspected to be a hiatal hernia, new from prior studies. IMPRESSION: Cardiomegaly with retrocardiac opacities suspected to be a hiatal hernia. The retrocardiac opacity is new from prior studies. A lateral chest radiograph would be helpful for confirmation. Electronically Signed   By: Deatra RobinsonKevin  Herman M.D.   On: 02/05/2017 18:18   Dg Hip Unilat W Or Wo Pelvis 2-3 Views Left  Result Date: 02/05/2017 CLINICAL DATA:  Fall with left hip pain. EXAM: DG HIP (WITH OR WITHOUT PELVIS) 2-3V LEFT COMPARISON:  None. FINDINGS: AP pelvis shows rightward rotation. There is diffuse bony demineralization. AP and frog-leg lateral views of the right hip show a complex femoral neck fracture with varus angulation. Fracture line in the proximal component containing the femoral head is curved and appears to have some remodeling along the fracture line. IMPRESSION: 1. Left femoral neck fracture with varus angulation. Imaging features are not entirely consistent with acute fracture and this may be an old fracture with bony remodeling. Pathologic fracture would also be a consideration. 2. Probable heterotopic ossification in the adjacent soft tissues suggesting nonacute injury. Electronically Signed   By: Kennith CenterEric  Mansell M.D.   On: 02/05/2017 18:23    ASSESSMENT / PLAN: Left hip fracture s/p fall  Atrial fibrillation with RVR Hypokalemia Acute renal failure  Leukocytosis  P: Continue amiodarone and cardizem gtt Maintain map >65 if a vasopressor is needed will order neo-synephrine  Cardiology and Orthopedic consulted appreciate input  Continue heparin  gtt  Trend CBC Monitor for s/sx of bleeding Transfuse hgb <7 Trend BMP  Replace electrolytes as indicated Monitor UOP  Prn morphine and norco for pain management  Continuous telemetry monitoring   Sonda Rumbleana Blakeney, AGNP  Pulmonary/Critical Care Pager [phone] (please enter 7 digits) PCCM Consult Pager [phone] (please enter 7 digits)  Billy Fischeravid Simonds, MD PCCM service Mobile [phone] Pager [phone] 02/06/2017 3:00 PM

## 2017-02-06 NOTE — Progress Notes (Signed)
HR remains elevated. Pt sleeping but arouses with repositioning.  Dr Sheryle Hailiamond in to assess,  Aware of HR and vitals.  No new orders at this time.

## 2017-02-06 NOTE — Progress Notes (Signed)
Pt to be transferred to ccu 15. Report given to brooke

## 2017-02-06 NOTE — Consult Note (Signed)
ORTHOPAEDIC CONSULTATION  REQUESTING PHYSICIAN: Enedina Finner, MD  Chief Complaint: left hip pain  HPI: Rebecca Webb is a 83 y.o. female who complains of  Left hip pain after a fall at home several days ago.  She lives with her daughter.  She has dementia.  She apparently fell late Sunday night.  02/01/2017.  She had been ambulatory prior to that but was unable to walk following the fall.  Family finally brought her to the emergency room at [facility] last night.  She was found to have a fracture of the lef  This may be acute on chronic.  There appears to be an intertrochanteric component and base neck component.  I've discussed treatment with the patient's daughters.  Since she was ambulatory and functional.  Functional I've recommended surgical fixation of this fracture.  I believe that intramedullary nailing is indicated. Her medical condition is very fragile.  Heart rate is currently elevated due to atrial fibrillation and she is being moved to the ICU for IV drips.  The risks and benefits of surgery were discussed with the daughters at  We would like to proceed with surgery tomorrow if the patient is stabilized.  Otherwise, I explained to them that it may be delayed further.  Past Medical History:  Diagnosis Date  . Atrial fibrillation (HCC)   . Chronic kidney disease   . Dementia   . Dementia   . Depression   . GERD (gastroesophageal reflux disease)   . Hyperlipemia   . Hypertension   . Vitamin D deficiency    Past Surgical History:  Procedure Laterality Date  . CATARACT EXTRACTION    . KIDNEY DONATION     Social History   Social History  . Marital status: Widowed    Spouse name: N/A  . Number of children: N/A  . Years of education: N/A   Social History Main Topics  . Smoking status: Never Smoker  . Smokeless tobacco: Current User    Types: Snuff  . Alcohol use No  . Drug use: No  . Sexual activity: Not Currently   Other Topics Concern  . None   Social History Narrative   . None   History reviewed. No pertinent family history. Allergies  Allergen Reactions  . Lisinopril Swelling    Tongue and lips was swollen.  . Haldol [Haloperidol Lactate] Rash   Prior to Admission medications   Medication Sig Start Date End Date Taking? Authorizing Provider  aspirin EC 81 MG tablet Take 81 mg by mouth daily.    Yes [provider]  diltiazem (CARDIZEM LA) 240 MG 24 hr tablet Take 1 tablet (240 mg total) by mouth daily. Patient taking differently: Take 240 mg by mouth 2 (two) times daily.  12/24/16  Yes Emily Filbert, MD  Memantine HCl ER 21 MG CP24 Take 21 mg by mouth daily. 08/27/16  Yes Brandy Hale, MD  Sennosides-Docusate Sodium (SENEXON-S PO) Take 2 tablets by mouth daily.   Yes [provider]  Vitamin D, Ergocalciferol, (DRISDOL) 50000 UNITS CAPS capsule Take 50,000 Units by mouth every 7 (seven) days.    Yes [provider]   Dg Chest 1 View  Result Date: 02/05/2017 CLINICAL DATA:  Fall EXAM: CHEST 1 VIEW COMPARISON:  None. FINDINGS: Cardiomegaly without pulmonary edema. No focal airspace consolidation. There is retrocardiac opacity crossing the midline, suspected to be a hiatal hernia, new from prior studies. IMPRESSION: Cardiomegaly with retrocardiac opacities suspected to be a hiatal hernia. The retrocardiac  opacity is new from prior studies. A lateral chest radiograph would be helpful for confirmation. Electronically Signed   By: Deatra RobinsonKevin  Herman M.D.   On: 02/05/2017 18:18   Dg Hip Unilat W Or Wo Pelvis 2-3 Views Left  Result Date: 02/05/2017 CLINICAL DATA:  Fall with left hip pain. EXAM: DG HIP (WITH OR WITHOUT PELVIS) 2-3V LEFT COMPARISON:  None. FINDINGS: AP pelvis shows rightward rotation. There is diffuse bony demineralization. AP and frog-leg lateral views of the right hip show a complex femoral neck fracture with varus angulation. Fracture line in the proximal component containing the femoral head is curved and appears to  have some remodeling along the fracture line. IMPRESSION: 1. Left femoral neck fracture with varus angulation. Imaging features are not entirely consistent with acute fracture and this may be an old fracture with bony remodeling. Pathologic fracture would also be a consideration. 2. Probable heterotopic ossification in the adjacent soft tissues suggesting nonacute injury. Electronically Signed   By: Kennith CenterEric  Mansell M.D.   On: 02/05/2017 18:23    Positive ROS: All other systems have been reviewed and were otherwise negative with the exception of those mentioned in the HPI and as above.  Physical Exam: General: Alert, no acute distress Cardiovascular: No pedal edema Respiratory: No cyanosis, no use of accessory musculature GI: No organomegaly, abdomen is soft and non-tender Skin: No lesions in the area of chief complaint Neurologic: Sensation intact distally Psychiatric: Patient is competent for consent with normal mood and affect Lymphatic: No axillary or cervical lymphadenopathy  MUSCULOSKELETAL: the left leg is shortened and rotated.  There is bruising around the left hip.  The skin is intact.  There is pain with movement of the left hip.  Neurovascular status is good distally. The right leg moves well.  No other i  Assessment: Intertrochanteric/base neck fracture left hip  Plan: Left hip nailing when medically stable    Valinda HoarMILLER,Maliek Schellhorn E, MD [phone]   02/06/2017 12:32 PM

## 2017-02-07 ENCOUNTER — Encounter: Payer: Self-pay | Admitting: Anesthesiology

## 2017-02-07 DIAGNOSIS — I4891 Unspecified atrial fibrillation: Secondary | ICD-10-CM

## 2017-02-07 LAB — BASIC METABOLIC PANEL
ANION GAP: 7 (ref 5–15)
BUN: 31 mg/dL — ABNORMAL HIGH (ref 6–20)
CO2: 19 mmol/L — AB (ref 22–32)
Calcium: 7.5 mg/dL — ABNORMAL LOW (ref 8.9–10.3)
Chloride: 120 mmol/L — ABNORMAL HIGH (ref 101–111)
Creatinine, Ser: 0.86 mg/dL (ref 0.44–1.00)
GFR, EST NON AFRICAN AMERICAN: 59 mL/min — AB (ref >60)
GLUCOSE: 90 mg/dL (ref 65–99)
POTASSIUM: 3.8 mmol/L (ref 3.5–5.1)
Sodium: 146 mmol/L — ABNORMAL HIGH (ref 135–145)

## 2017-02-07 LAB — CBC
HCT: 28.7 % — ABNORMAL LOW (ref 35.0–47.0)
Hemoglobin: 9.2 g/dL — ABNORMAL LOW (ref 12.0–16.0)
MCH: 30.6 pg (ref 26.0–34.0)
MCHC: 32.2 g/dL (ref 32.0–36.0)
MCV: 95.1 fL (ref 80.0–100.0)
Platelets: 273 10*3/uL (ref 150–440)
RBC: 3.02 MIL/uL — ABNORMAL LOW (ref 3.80–5.20)
RDW: 14.6 % — ABNORMAL HIGH (ref 11.5–14.5)
WBC: 13.1 10*3/uL — ABNORMAL HIGH (ref 3.6–11.0)

## 2017-02-07 LAB — HEPARIN LEVEL (UNFRACTIONATED): Heparin Unfractionated: 0.2 IU/mL — ABNORMAL LOW (ref 0.30–0.70)

## 2017-02-07 LAB — GLUCOSE, CAPILLARY: Glucose-Capillary: 93 mg/dL (ref 65–99)

## 2017-02-07 MED ORDER — NEOMYCIN-POLYMYXIN B GU 40-200000 IR SOLN
Status: AC
  Filled 2017-02-07: qty 2

## 2017-02-07 MED ORDER — ORAL CARE MOUTH RINSE
15.0000 mL | Freq: Two times a day (BID) | OROMUCOSAL | Status: DC
  Administered 2017-02-09 – 2017-02-13 (×7): 15 mL via OROMUCOSAL

## 2017-02-07 MED ORDER — HEPARIN BOLUS VIA INFUSION
900.0000 [IU] | Freq: Once | INTRAVENOUS | Status: AC
  Administered 2017-02-07: 900 [IU] via INTRAVENOUS
  Filled 2017-02-07: qty 900

## 2017-02-07 MED ORDER — KCL IN DEXTROSE-NACL 20-5-0.45 MEQ/L-%-% IV SOLN
INTRAVENOUS | Status: DC
  Administered 2017-02-07: 50 mL/h via INTRAVENOUS
  Filled 2017-02-07 (×3): qty 1000

## 2017-02-07 MED ORDER — BUPIVACAINE-EPINEPHRINE (PF) 0.5% -1:200000 IJ SOLN
INTRAMUSCULAR | Status: AC
  Filled 2017-02-07: qty 30

## 2017-02-07 MED ORDER — BUPIVACAINE-EPINEPHRINE (PF) 0.25% -1:200000 IJ SOLN
INTRAMUSCULAR | Status: AC
  Filled 2017-02-07: qty 30

## 2017-02-07 MED ORDER — HYDROCODONE-ACETAMINOPHEN 5-325 MG PO TABS
1.0000 | ORAL_TABLET | ORAL | Status: DC | PRN
  Administered 2017-02-11 – 2017-02-12 (×2): 1 via ORAL
  Filled 2017-02-07 (×2): qty 1

## 2017-02-07 MED ORDER — SODIUM CHLORIDE 0.9 % IV BOLUS (SEPSIS)
1000.0000 mL | Freq: Once | INTRAVENOUS | Status: AC
  Administered 2017-02-07: 1000 mL via INTRAVENOUS

## 2017-02-07 MED ORDER — PROPOFOL 500 MG/50ML IV EMUL
INTRAVENOUS | Status: AC
  Filled 2017-02-07: qty 50

## 2017-02-07 MED ORDER — MORPHINE SULFATE (PF) 2 MG/ML IV SOLN
2.0000 mg | INTRAVENOUS | Status: DC | PRN
  Administered 2017-02-08 – 2017-02-11 (×12): 2 mg via INTRAVENOUS
  Filled 2017-02-07 (×12): qty 1

## 2017-02-07 MED ORDER — CHLORHEXIDINE GLUCONATE 0.12 % MT SOLN
15.0000 mL | Freq: Two times a day (BID) | OROMUCOSAL | Status: DC
  Administered 2017-02-07 – 2017-02-13 (×10): 15 mL via OROMUCOSAL
  Filled 2017-02-07 (×10): qty 15

## 2017-02-07 MED ORDER — HEPARIN (PORCINE) IN NACL 100-0.45 UNIT/ML-% IJ SOLN
900.0000 [IU]/h | INTRAMUSCULAR | Status: DC
  Administered 2017-02-07 (×2): 900 [IU]/h via INTRAVENOUS
  Filled 2017-02-07: qty 250

## 2017-02-07 MED ORDER — DEXMEDETOMIDINE HCL IN NACL 400 MCG/100ML IV SOLN
0.4000 ug/kg/h | INTRAVENOUS | Status: DC
  Administered 2017-02-07: 0.5 ug/kg/h via INTRAVENOUS
  Filled 2017-02-07: qty 100

## 2017-02-07 NOTE — Progress Notes (Signed)
[facility] [place] [facility]   PATIENT NAME: Rebecca Webb    MR#:  [id number]  DATE OF BIRTH:  [birth year]  SUBJECTIVE:  Sleepy. Unable to provide any history or review of system.  Pt was transferred y'day due to rapid afib requiring amiodarone and cardizem HR 90-108. Now off cardizem gtt Intermittent hypotension  REVIEW OF SYSTEMS:   Review of Systems  Unable to perform ROS: Dementia     DRUG ALLERGIES:   Allergies  Allergen Reactions  . Lisinopril Swelling    Tongue and lips was swollen.  . Haldol [Haloperidol Lactate] Rash    VITALS:  Blood pressure (!) 88/69, pulse (!) 102, temperature 97.9 F (36.6 C), resp. rate 19, height 5\' 2"  (1.575 m), weight 60.1 kg (132 lb 7.9 oz), SpO2 90 %.  PHYSICAL EXAMINATION:   Physical Exam  GENERAL:  83 y.o.-year-old patient lying in the bed with no acute distress. thin EYES: Pupils equal, round, reactive to light and accommodation. No scleral icterus. Extraocular muscles intact.  HEENT: Head atraumatic, normocephalic. Oropharynx and nasopharynx clear.  NECK:  Supple, no jugular venous distention. No thyroid enlargement, no tenderness.  LUNGS: Normal breath sounds bilaterally, no wheezing, rales, rhonchi. No use of accessory muscles of respiration.  CARDIOVASCULAR: S1, S2 normal. No murmurs, rubs, or gallops.  ABDOMEN: Soft, nontender, nondistended. Bowel sounds present. No organomegaly or mass.  EXTREMITIES: No cyanosis, clubbing or edema b/l.   Bucks traction left leg NEUROLOGIC: unable to assess PSYCHIATRIC:sleepy SKIN: No obvious rash, lesion, or ulcer.   LABORATORY PANEL:  CBC  Recent Labs Lab 02/07/17 0834  WBC 13.1*  HGB 9.2*  HCT 28.7*  PLT 273    Chemistries   Recent Labs Lab 02/06/17 0440 02/06/17 1641  NA 147*  --   K 3.3* 3.2*  CL 119*  --   CO2 19*  --   GLUCOSE 109*  --   BUN 49*  --   CREATININE 1.02*  --   CALCIUM 7.5*  --   MG 2.3 2.3   Cardiac Enzymes No  results for input(s): TROPONINI in the last 168 hours. RADIOLOGY:  Dg Chest 1 View  Result Date: 02/05/2017 CLINICAL DATA:  Fall EXAM: CHEST 1 VIEW COMPARISON:  None. FINDINGS: Cardiomegaly without pulmonary edema. No focal airspace consolidation. There is retrocardiac opacity crossing the midline, suspected to be a hiatal hernia, new from prior studies. IMPRESSION: Cardiomegaly with retrocardiac opacities suspected to be a hiatal hernia. The retrocardiac opacity is new from prior studies. A lateral chest radiograph would be helpful for confirmation. Electronically Signed   By: Deatra RobinsonKevin  Herman M.D.   On: 02/05/2017 18:18   Dg Hip Unilat W Or Wo Pelvis 2-3 Views Left  Result Date: 02/05/2017 CLINICAL DATA:  Fall with left hip pain. EXAM: DG HIP (WITH OR WITHOUT PELVIS) 2-3V LEFT COMPARISON:  None. FINDINGS: AP pelvis shows rightward rotation. There is diffuse bony demineralization. AP and frog-leg lateral views of the right hip show a complex femoral neck fracture with varus angulation. Fracture line in the proximal component containing the femoral head is curved and appears to have some remodeling along the fracture line. IMPRESSION: 1. Left femoral neck fracture with varus angulation. Imaging features are not entirely consistent with acute fracture and this may be an old fracture with bony remodeling. Pathologic fracture would also be a consideration. 2. Probable heterotopic ossification in the adjacent soft tissues suggesting nonacute injury. Electronically Signed   By: Jamison OkaEric  Mansell M.D.  On: 02/05/2017 18:23   ASSESSMENT AND PLAN:  83 y.o. female with known essential hypertension mixed hyperlipidemia dementia chronic kidney disease stage III having recent fall with significant hip pain and probable fracture of for which the patient is unable to ambulate. The patient was seen in the emergency room for further treatment options and has noticed that the patient was having atrial fibrillation with rapid  ventricular rate  #1 acute left hip fracture After mechanical fall at home 4 days ago -consult orthopedics with Dr Hyacinth Meeker -surgery on hold due to cardiac issues (afib and hypotension) - pain control with IV Dilaudid 2 mg every 6 hours prn  #2 atrial fibrillation with RVR: Patient has chronic atrial fibrillation, on  Cardizem   -on IV Amiodarone gtt -IV cardizem gtt weaned off -re-initiate IV heparin gtt  Since surgery on hold.  .#3 leukocytosis: Appears reactive  #4  acute kidney injury -creatinine 1.39--1.01--0.86 -Continue IV fluids.  #5 CODE STATUS full code, discussed the CODE STATUS with patient's granddaughter Lyndle Herrlich who wants full resuscitation.  She says she is the Legal guardian and has paper work given to be placed in pt's chart.  Case discussed with Care Management/Social Worker. Management plans discussed with the family and they are in agreement.  CODE STATUS FULL per grand dter Misty Stanley  DVT Prophylaxis: SCD, TEDS and heparin gtt  TOTAL TIME TAKING CARE OF THIS PATIENT: 30 minutes.  >50% time spent on counselling and coordination of care  POSSIBLE D/C IN *2-3* DAYS, DEPENDING ON CLINICAL CONDITION.  Note: This dictation was prepared with Dragon dictation along with smaller phrase technology. Any transcriptional errors that result from this process are unintentional.  Knox Cervi M.D on 02/07/2017 at 9:09 AM  Between 7am to 6pm - Pager - [phone]  After 6pm go to [email or web address] - Social research officer, government  [facility]  Office  [phone]  CC: Primary care physician; Leotis Shames, MDPatient ID: Rebecca Webb, female   DOB: 1928-12-05, 83 y.o.   MRN: [id number]

## 2017-02-07 NOTE — Progress Notes (Signed)
[facility]  Patient: Rebecca Webb   Subjective: Patient is hemodynamically stable at this time with borderline blood pressure atolic but no evidence of significant congestive heart failure or true angina. Patient does have atrial fibrillation with rapid ventricular rate now more controlled with combination of diltiazem and amiodarone drip. Patient doesn't take oral at this time.  Review of Systems:  cannot assess due to obtundation and dementia Objective: Telemetry: atrial fibrillation with rapid ventricular rate Physical Exam: Blood pressure (!) 70/60, pulse 91, temperature 98.2 F (36.8 C), temperature source Axillary, resp. rate 11, height 5\' 2"  (1.575 m), weight 60.1 kg (132 lb 7.9 oz), SpO2 98 %. Body mass index is 24.23 kg/m. General: Well developed, well nourished, in no acute distress. Head: Normocephalic, atraumatic, sclera non-icteric, no xanthomas, nares are without discharge. Neck: No apparent masses Lungs: Normal respirations with no wheezes, no rhonchi, no rales , no crackles   Heart: irregular rate and rhythm, normal S1 S2, no murmur, no rub, no gallop, PMI is normal size and placement, carotid upstroke normal without bruit, jugular venous pressure normal Abdomen: Soft, non-tender, non-distended with normoactive bowel sounds. No hepatosplenomegaly. Abdominal aorta is normal size without bruit Extremities:trace edema, no clubbing, no cyanosis, no ulcers,  Peripheral: 2+ radial, 2+ femoral, 2+ dorsal pedal pulses Neuro: does notAlert and oriented. Moves all extremities spontaneously. Psych:  Does notResponds to questions appropriately with a normal affect.   Intake/Output Summary (Last 24 hours) at 02/07/17 0618 Last data filed at 02/07/17 0500  Gross per 24 hour  Intake          2747.72 ml  Output              600 ml  Net          2147.72 ml    Inpatient  Medications:  . Influenza vac split quadrivalent PF  0.5 mL Intramuscular Tomorrow-1000  . mouth rinse  15 mL Mouth Rinse BID  . memantine  21 mg Oral Daily  . pneumococcal 23 valent vaccine  0.5 mL Intramuscular Tomorrow-1000  . senna-docusate  2 tablet Oral QHS   Infusions:  . sodium chloride 75 mL/hr at 02/07/17 0500  . amiodarone 60 mg/hr (02/07/17 0500)  . dexmedetomidine (PRECEDEX) IV infusion Stopped (02/07/17 0300)  . diltiazem (CARDIZEM) infusion Stopped (02/07/17 0355)  . heparin 800 Units/hr (02/07/17 0500)    Labs:  Recent Labs  02/05/17 1715 02/06/17 0440 02/06/17 1641  NA 145 147*  --   K 3.9 3.3* 3.2*  CL 107 119*  --   CO2 23 19*  --   GLUCOSE 128* 109*  --   BUN 58* 49*  --   CREATININE 1.39* 1.02*  --   CALCIUM 8.5* 7.5*  --   MG  --  2.3 2.3  PHOS  --   --  3.3   No results for input(s): AST, ALT, ALKPHOS, BILITOT, PROT, ALBUMIN in the last 72 hours.  Recent Labs  02/05/17 1715 02/06/17 0440  WBC 19.1* 14.6*  NEUTROABS 17.3*  --   HGB 11.2* 10.4*  HCT 33.1* 31.3*  MCV 91.5 94.4  PLT 329 263   No results for input(s): CKTOTAL, CKMB, TROPONINI in the last 72 hours. Invalid input(s): POCBNP No results for input(s): HGBA1C in the last 72 hours.   Weights: Filed Weights   02/05/17 2132 02/06/17 0542 02/07/17 0500  Weight: 50.3 kg (111 lb)  61 kg (134 lb 6.4 oz) 60.1 kg (132 lb 7.9 oz)     Radiology/Studies:  Dg Chest 1 View  Result Date: Webb CLINICAL DATA:  Fall EXAM: CHEST 1 VIEW COMPARISON:  None. FINDINGS: Cardiomegaly without pulmonary edema. No focal airspace consolidation. There is retrocardiac opacity crossing the midline, suspected to be a hiatal hernia, new from prior studies. IMPRESSION: Cardiomegaly with retrocardiac opacities suspected to be a hiatal hernia. The retrocardiac opacity is new from prior studies. A lateral chest radiograph would be helpful for confirmation. Electronically Signed   By: Deatra Robinson M.D.   On:  Webb 18:18   Dg Hip Unilat W Or Wo Pelvis 2-3 Views Left  Result Date: Webb CLINICAL DATA:  Fall with left hip pain. EXAM: DG HIP (WITH OR WITHOUT PELVIS) 2-3V LEFT COMPARISON:  None. FINDINGS: AP pelvis shows rightward rotation. There is diffuse bony demineralization. AP and frog-leg lateral views of the right hip show a complex femoral neck fracture with varus angulation. Fracture line in the proximal component containing the femoral head is curved and appears to have some remodeling along the fracture line. IMPRESSION: 1. Left femoral neck fracture with varus angulation. Imaging features are not entirely consistent with acute fracture and this may be an old fracture with bony remodeling. Pathologic fracture would also be a consideration. 2. Probable heterotopic ossification in the adjacent soft tissues suggesting nonacute injury. Electronically Signed   By: Kennith Center M.D.   On: Webb 18:23     Assessment and Recommendation  83 y.o. female with the known chronic kidney disease essential hypertensionwith the atrial fibrillation with rapid ventricular rate after above orthopedic fracture. The patient has had reasonable control ofheart rate with no evidence of heart failure or myocardial infarction. 1. Continue current medical regimen for heart rate control including amiodarone and diltiazem drip due to patient not taking oral medication 2. Heparin for further risk reduction in stroke with atrial fibrillation 3. Proceed to orthopedic surgery as necessarywith control of above with patient being moderate risk at this time for cardiovascular complication although will need surgery for further rehabilitation 4. No further cardiac diagnostics necessary at this time  Signed, Arnoldo Hooker M.D. FACC

## 2017-02-07 NOTE — Progress Notes (Signed)
Name: Rebecca Webb MRN: [id number] DOB: [birth year]    ADMISSION DATE:  02/05/2017 CONSULTATION DATE: 02/06/2017  REFERRING MD : Dr. Allena Katz   CHIEF COMPLAINT: Atrial Fibrillation with RVR   BRIEF PATIENT DESCRIPTION:  83 yo female admitted to telemetry unit 10/18 with left hip fracture following a fall at home and afibb with rvr transferred to the stepdown unit 10/19 due to worsening afibb with rvr requiring amiodarone and cardizem gtts  SIGNIFICANT EVENTS  10/18-Pt admitted to the medsurg unit  10/19-Pt transferred to stepdown unit   STUDIES:  None  HISTORY OF PRESENT ILLNESS:   This is an 83 yo female with a PMH of Vitamin D Deficiency, HTN, Hyperlipidemia, GERD, Depression, Dementia, CKD, and Atrial Fibrillation.  She presented to [facility] ER 10/18 with left leg pain and difficulty walking following a fall at home.  Per ER notes she had a fall 4 days prior to presentation to the ER and was unable to bear weight on her left leg since the fall. In the ER hip xray revealed left femoral neck fracture unclear if old vs.acute fracture. An EKG revealed she was in atrial fibrillation with rvr heart rate 175, therefore a cardizem gtt was initiated.  She was subsequently admitted to the telemetry unit by hospitalist team for further workup and treatment.  Due to hypotension secondary to cardizem gtt she was transitioned to amiodarone gtt on 10/19, however she remained in afibb with rvr requiring transfer to the stepdown unit for initiation of both cardizem and amiodarone gtts.  SUBJECTIVE:  Worsening agitation and confusion; started on a precedex gtt. Now sleeping comfortably  VITAL SIGNS: Temp:  [97 F (36.1 C)-98.2 F (36.8 C)] 98.2 F (36.8 C) (10/20 0200) Pulse Rate:  [84-145] 102 (10/20 0700) Resp:  [0-29] 16 (10/20 0700) BP: (68-123)/(51-108) 84/73 (10/20 0700) SpO2:  [92 %-99 %] 95 % (10/20 0700) Weight:  [132 lb 7.9 oz (60.1 kg)] 132 lb 7.9 oz (60.1 kg) (10/20 0500)  PHYSICAL  EXAMINATION: General: frail elderly female, agitated NAD  Neuro:Somnolent, awakens to voice and touch, confused HEENT: PERRLA, supple, no JVD Cardiovascular: irregular, irregular, no M/R/G Lungs: shallow respirations diminished throughout, even, non labored  Abdomen: +BS x4, soft, non distended  Musculoskeletal: left lower extremity brace in bucks traction, moves all extremities  Skin: intact no rashes or lesions    Recent Labs Lab 02/05/17 1715 02/06/17 0440 02/06/17 1641  NA 145 147*  --   K 3.9 3.3* 3.2*  CL 107 119*  --   CO2 23 19*  --   BUN 58* 49*  --   CREATININE 1.39* 1.02*  --   GLUCOSE 128* 109*  --     Recent Labs Lab 02/05/17 1715 02/06/17 0440  HGB 11.2* 10.4*  HCT 33.1* 31.3*  WBC 19.1* 14.6*  PLT 329 263   Dg Chest 1 View  Result Date: 02/05/2017 CLINICAL DATA:  Fall EXAM: CHEST 1 VIEW COMPARISON:  None. FINDINGS: Cardiomegaly without pulmonary edema. No focal airspace consolidation. There is retrocardiac opacity crossing the midline, suspected to be a hiatal hernia, new from prior studies. IMPRESSION: Cardiomegaly with retrocardiac opacities suspected to be a hiatal hernia. The retrocardiac opacity is new from prior studies. A lateral chest radiograph would be helpful for confirmation. Electronically Signed   By: Deatra Robinson M.D.   On: 02/05/2017 18:18   Dg Hip Unilat W Or Wo Pelvis 2-3 Views Left  Result Date: 02/05/2017 CLINICAL DATA:  Fall with left hip pain. EXAM:  DG HIP (WITH OR WITHOUT PELVIS) 2-3V LEFT COMPARISON:  None. FINDINGS: AP pelvis shows rightward rotation. There is diffuse bony demineralization. AP and frog-leg lateral views of the right hip show a complex femoral neck fracture with varus angulation. Fracture line in the proximal component containing the femoral head is curved and appears to have some remodeling along the fracture line. IMPRESSION: 1. Left femoral neck fracture with varus angulation. Imaging features are not entirely  consistent with acute fracture and this may be an old fracture with bony remodeling. Pathologic fracture would also be a consideration. 2. Probable heterotopic ossification in the adjacent soft tissues suggesting nonacute injury. Electronically Signed   By: Kennith CenterEric  Mansell M.D.   On: 02/05/2017 18:23    ASSESSMENT / PLAN: Left hip fracture s/p fall  Atrial fibrillation with RVR Hypokalemia Acute renal failure  Leukocytosis  P: Continue amiodarone and cardizem gtt Precedex gtt; titrate to keep RASS 0 Neo-synephrine to keep map >65  Cardiology and Orthopedic consulted appreciate input  Continue heparin gtt  Trend CBC Monitor for s/sx of bleeding Transfuse hgb <7 Trend BMP  Replace electrolytes as indicated Monitor UOP  Prn morphine and norco for pain management  Continuous telemetry monitoring  GI and DVT prophylaxis   Magdalene S. Tukov ANP-BC Pulmonary and Critical Care Medicine [facility] Pager [phone] or [phone]   PCCM ATTENDING ATTESTATION: I have evaluated patient with the APP Luci Bankukov, reviewed database in its entirety and discussed care plan in detail. In addition, this patient was discussed on multidisciplinary rounds.   Her rate is controlled off of diltiazem infusion. Surgery has been postponed by Anesthesiology. She is stable for transfer to Telemetry  After transfer, PCCM will sign off. Please call if we can be of further assistance  Billy Fischeravid Gila Lauf, MD PCCM service Mobile [phone] Pager [phone] 02/07/2017 11:29 AM

## 2017-02-07 NOTE — Progress Notes (Signed)
All medications restarted after insertion of PICC line.  Heparin restarted at 900 units/hr.   Precedex at 0.5 mcg/kg/hr.

## 2017-02-07 NOTE — Progress Notes (Signed)
Spoke with Secretary re PICC, RN unavailable, Secretary states [facility] has been notified of need for PICC

## 2017-02-07 NOTE — Progress Notes (Signed)
ANTICOAGULATION CONSULT NOTE - Follow Up Consult  Pharmacy Consult for Heparin  Indication: atrial fibrillation  Allergies  Allergen Reactions  . Lisinopril Swelling    Tongue and lips was swollen.  . Haldol [Haloperidol Lactate] Rash    Patient Measurements: Height: 5\' 2"  (157.5 cm) Weight: 132 lb 7.9 oz (60.1 kg) IBW/kg (Calculated) : 50.1 Heparin Dosing Weight: 60.1kg   Vital Signs: Temp: 97.8 F (36.6 C) (10/20 2000) Temp Source: Axillary (10/20 2000) BP: 107/77 (10/20 1800)  Labs:  Recent Labs  02/05/17 1715 02/05/17 1800 02/06/17 0440 02/06/17 2247 02/07/17 0834 02/07/17 1931  HGB 11.2*  --  10.4*  --  9.2*  --   HCT 33.1*  --  31.3*  --  28.7*  --   PLT 329  --  263  --  273  --   LABPROT  --  14.8  --   --   --   --   INR  --  1.17  --   --   --   --   HEPARINUNFRC  --   --   --  0.20* 0.20* <0.10*  CREATININE 1.39*  --  1.02*  --  0.86  --     Estimated Creatinine Clearance: 37.1 mL/min (by C-G formula based on SCr of 0.86 mg/dL).   Medications:  Prescriptions Prior to Admission  Medication Sig Dispense Refill Last Dose  . aspirin EC 81 MG tablet Take 81 mg by mouth daily.    02/05/2017 at 0800  . diltiazem (CARDIZEM LA) 240 MG 24 hr tablet Take 1 tablet (240 mg total) by mouth daily. (Patient taking differently: Take 240 mg by mouth 2 (two) times daily. ) 30 tablet 6 02/05/2017 at 0800  . Memantine HCl ER 21 MG CP24 Take 21 mg by mouth daily. 30 capsule 2 02/05/2017 at 0800  . Sennosides-Docusate Sodium (SENEXON-S PO) Take 2 tablets by mouth daily.   02/05/2017 at Unknown time  . Vitamin D, Ergocalciferol, (DRISDOL) 50000 UNITS CAPS capsule Take 50,000 Units by mouth every 7 (seven) days.    Past Week at Unknown time    Assessment: 10/20 @ 08:34:  HL = 0.2  Goal of Therapy:  Heparin level 0.3-0.7 units/ml Monitor platelets by anticoagulation protocol: Yes   Plan:  Will order bolus of Heparin 900 units IV X 1 and increase drip rate to 900  units/hr. Will recheck HL 8 hrs after rate change at 18:00.   10/20 1931 HL <0.10. According to nurse heparin drip has been stopped for PICC placement. Heparin restarted at 2025. Will recheck HL ini 8 hours.   Gardner CandleSheema M Hiedi Touchton, PharmD, BCPS Clinical Pharmacist 02/07/2017 8:27 PM

## 2017-02-07 NOTE — Progress Notes (Signed)
Patient alert. Needed redirection multiple times this shift. Morphine given x 2 for pain relief with little effect.  Patient in mitts pulling at IVs and SCDs.  Medication given to promote rest. Good results. Patient rested well this shift.  IV bolus NS x 2 given. Urine x 3, unmeasured.   Will continue to monitor.

## 2017-02-07 NOTE — Progress Notes (Signed)
Orders obtained per Dr. Bard HerbertSimmonds to re-start Precedex. Lurene ShadowBAllen, RN

## 2017-02-07 NOTE — Progress Notes (Signed)
Subjective: Dissoriented.  Moving around in bed  Too unstable for surgery today per anesthesia. On 2 drips and hypotensive.    * Surgery Date in Future * Procedure(s) (LRB): INTRAMEDULLARY (IM) NAIL INTERTROCHANTRIC (Left)    Patient reports pain as non communacative.  Objective:  Left leg in traction.  csm good.  Skin intact  VITALS:   Vitals:   02/07/17 0700 02/07/17 0800  BP: (!) 84/73 (!) 88/69  Pulse: (!) 102 (!) 102  Resp: 16 19  Temp:  97.9 F (36.6 C)  SpO2: 95% 90%    Neurologically intact ABD soft Neurovascular intact Sensation intact distally Intact pulses distally Dorsiflexion/Plantar flexion intact  LABS  Recent Labs  02/05/17 1715 02/06/17 0440 02/07/17 0834  HGB 11.2* 10.4* 9.2*  HCT 33.1* 31.3* 28.7*  WBC 19.1* 14.6* 13.1*  PLT 329 263 273     Recent Labs  02/05/17 1715 02/06/17 0440 02/06/17 1641 02/07/17 0834  NA 145 147*  --  146*  K 3.9 3.3* 3.2* 3.8  BUN 58* 49*  --  31*  CREATININE 1.39* 1.02*  --  0.86  GLUCOSE 128* 109*  --  90     Recent Labs  02/05/17 1800  INR 1.17     Assessment/Plan: * Surgery Date in Future * Procedure(s) (LRB): INTRAMEDULLARY (IM) NAIL INTERTROCHANTRIC (Left)  Plan surgery for Monday now.  Hopefully she will stabilize by then. Continue heparin drip for now

## 2017-02-07 NOTE — Progress Notes (Signed)
Attempting to call report to 2A to transport pt to tele bed.  Pt continues to be confused and at times combative.  Remains off Cardizem since 4am today, cont on Amiodarone at 60mg  (cardiology to manage dosing per Dr. Bard HerbertSimmonds).  Pt has pulled out one of her IV's regardless of mits and distraction used.  Per Dr. Bard HerbertSimmonds ok for PICC line insertion.  Lurene ShadowBAllen, RN

## 2017-02-07 NOTE — Progress Notes (Signed)
Pt expressing need to void. External cath and pad in place but no drainage and and pt unable to follow commands to let it go.  HR up to 120's and pt is restless. Bladder scan shows 526cc retained urine. Per Dr. Bard HerbertSimmonds, ok to insert Foley.  Lurene ShadowBAllen, RN

## 2017-02-07 NOTE — Progress Notes (Signed)
ANTICOAGULATION CONSULT NOTE - Follow Up Consult  Pharmacy Consult for Heparin  Indication: atrial fibrillation  Allergies  Allergen Reactions  . Lisinopril Swelling    Tongue and lips was swollen.  . Haldol [Haloperidol Lactate] Rash    Patient Measurements: Height: 5\' 2"  (157.5 cm) Weight: 132 lb 7.9 oz (60.1 kg) IBW/kg (Calculated) : 50.1 Heparin Dosing Weight: 60.1kg   Vital Signs: Temp: 97.9 F (36.6 C) (10/20 0800) Temp Source: Axillary (10/20 0200) BP: 88/69 (10/20 0800) Pulse Rate: 102 (10/20 0800)  Labs:  Recent Labs  02/05/17 1715 02/05/17 1800 02/06/17 0440 02/06/17 2247 02/07/17 0834  HGB 11.2*  --  10.4*  --  9.2*  HCT 33.1*  --  31.3*  --  28.7*  PLT 329  --  263  --  273  LABPROT  --  14.8  --   --   --   INR  --  1.17  --   --   --   HEPARINUNFRC  --   --   --  0.20* 0.20*  CREATININE 1.39*  --  1.02*  --  0.86    Estimated Creatinine Clearance: 37.1 mL/min (by C-G formula based on SCr of 0.86 mg/dL).   Medications:  Prescriptions Prior to Admission  Medication Sig Dispense Refill Last Dose  . aspirin EC 81 MG tablet Take 81 mg by mouth daily.    02/05/2017 at 0800  . diltiazem (CARDIZEM LA) 240 MG 24 hr tablet Take 1 tablet (240 mg total) by mouth daily. (Patient taking differently: Take 240 mg by mouth 2 (two) times daily. ) 30 tablet 6 02/05/2017 at 0800  . Memantine HCl ER 21 MG CP24 Take 21 mg by mouth daily. 30 capsule 2 02/05/2017 at 0800  . Sennosides-Docusate Sodium (SENEXON-S PO) Take 2 tablets by mouth daily.   02/05/2017 at Unknown time  . Vitamin D, Ergocalciferol, (DRISDOL) 50000 UNITS CAPS capsule Take 50,000 Units by mouth every 7 (seven) days.    Past Week at Unknown time    Assessment: 10/20 @ 08:34:  HL = 0.2  Goal of Therapy:  Heparin level 0.3-0.7 units/ml Monitor platelets by anticoagulation protocol: Yes   Plan:  Will order bolus of Heparin 900 units IV X 1 and increase drip rate to 900 units/hr. Will recheck HL 8  hrs after rate change at 18:00.   Seren Chaloux K, RPH 02/07/2017,9:47 AM

## 2017-02-08 DIAGNOSIS — I482 Chronic atrial fibrillation: Secondary | ICD-10-CM

## 2017-02-08 DIAGNOSIS — R410 Disorientation, unspecified: Secondary | ICD-10-CM

## 2017-02-08 LAB — BASIC METABOLIC PANEL
ANION GAP: 4 — AB (ref 5–15)
BUN: 26 mg/dL — ABNORMAL HIGH (ref 6–20)
CALCIUM: 7.9 mg/dL — AB (ref 8.9–10.3)
CO2: 21 mmol/L — AB (ref 22–32)
Chloride: 117 mmol/L — ABNORMAL HIGH (ref 101–111)
Creatinine, Ser: 0.94 mg/dL (ref 0.44–1.00)
GFR calc non Af Amer: 53 mL/min — ABNORMAL LOW (ref >60)
Glucose, Bld: 125 mg/dL — ABNORMAL HIGH (ref 65–99)
Potassium: 3.2 mmol/L — ABNORMAL LOW (ref 3.5–5.1)
SODIUM: 142 mmol/L (ref 135–145)

## 2017-02-08 LAB — CBC
HCT: 27.6 % — ABNORMAL LOW (ref 35.0–47.0)
HEMOGLOBIN: 9.2 g/dL — AB (ref 12.0–16.0)
MCH: 31.3 pg (ref 26.0–34.0)
MCHC: 33.5 g/dL (ref 32.0–36.0)
MCV: 93.5 fL (ref 80.0–100.0)
Platelets: 272 10*3/uL (ref 150–440)
RBC: 2.95 MIL/uL — AB (ref 3.80–5.20)
RDW: 14.4 % (ref 11.5–14.5)
WBC: 11 10*3/uL (ref 3.6–11.0)

## 2017-02-08 LAB — GLUCOSE, CAPILLARY: GLUCOSE-CAPILLARY: 117 mg/dL — AB (ref 65–99)

## 2017-02-08 LAB — HEPARIN LEVEL (UNFRACTIONATED)
HEPARIN UNFRACTIONATED: 0.63 [IU]/mL (ref 0.30–0.70)
HEPARIN UNFRACTIONATED: 0.8 [IU]/mL — AB (ref 0.30–0.70)

## 2017-02-08 MED ORDER — LORAZEPAM 2 MG/ML IJ SOLN
0.5000 mg | Freq: Four times a day (QID) | INTRAMUSCULAR | Status: DC | PRN
  Administered 2017-02-08 – 2017-02-12 (×4): 0.5 mg via INTRAVENOUS
  Filled 2017-02-08 (×4): qty 1

## 2017-02-08 MED ORDER — KCL IN DEXTROSE-NACL 40-5-0.45 MEQ/L-%-% IV SOLN
INTRAVENOUS | Status: DC
  Administered 2017-02-08: 11:00:00 via INTRAVENOUS
  Administered 2017-02-09: 50 mL/h via INTRAVENOUS
  Administered 2017-02-10 – 2017-02-11 (×2): via INTRAVENOUS
  Filled 2017-02-08 (×6): qty 1000

## 2017-02-08 MED ORDER — POTASSIUM CHLORIDE 10 MEQ/100ML IV SOLN
10.0000 meq | INTRAVENOUS | Status: AC
  Administered 2017-02-08 (×3): 10 meq via INTRAVENOUS
  Filled 2017-02-08 (×3): qty 100

## 2017-02-08 MED ORDER — METOPROLOL TARTRATE 5 MG/5ML IV SOLN
2.5000 mg | INTRAVENOUS | Status: DC | PRN
  Administered 2017-02-09: 2.5 mg via INTRAVENOUS
  Administered 2017-02-09: 5 mg via INTRAVENOUS
  Administered 2017-02-10 (×2): 2.5 mg via INTRAVENOUS
  Administered 2017-02-13 – 2017-02-14 (×3): 5 mg via INTRAVENOUS
  Filled 2017-02-08 (×7): qty 5

## 2017-02-08 MED ORDER — HEPARIN (PORCINE) IN NACL 100-0.45 UNIT/ML-% IJ SOLN
850.0000 [IU]/h | INTRAMUSCULAR | Status: DC
  Administered 2017-02-09: 850 [IU]/h via INTRAVENOUS
  Filled 2017-02-08: qty 250

## 2017-02-08 NOTE — Progress Notes (Signed)
Spoke with Dr Bard HerbertSimmonds regarding patients heart rate increasing, beats of VTAC noted. Orders for metoprolol 2.5 to 5 mg every 3 hours PRN for heart rate greater then 115.

## 2017-02-08 NOTE — Progress Notes (Signed)
ANTICOAGULATION CONSULT NOTE - Follow Up Consult  Pharmacy Consult for Heparin  Indication: atrial fibrillation  Allergies  Allergen Reactions  . Lisinopril Swelling    Tongue and lips was swollen.  . Haldol [Haloperidol Lactate] Rash    Patient Measurements: Height: 5\' 2"  (157.5 cm) Weight: 134 lb 4.2 oz (60.9 kg) IBW/kg (Calculated) : 50.1 Heparin Dosing Weight: 60.1kg   Vital Signs: Temp: 98 F (36.7 C) (10/21 0700) BP: 136/77 (10/21 1302) Pulse Rate: 55 (10/21 1130)  Labs:  Recent Labs  02/05/17 1800 02/06/17 0440  02/07/17 0834 02/07/17 1931 02/08/17 0452 02/08/17 1431  HGB  --  10.4*  --  9.2*  --  9.2*  --   HCT  --  31.3*  --  28.7*  --  27.6*  --   PLT  --  263  --  273  --  272  --   LABPROT 14.8  --   --   --   --   --   --   INR 1.17  --   --   --   --   --   --   HEPARINUNFRC  --   --   < > 0.20* <0.10* 0.63 0.80*  CREATININE  --  1.02*  --  0.86  --  0.94  --   < > = values in this interval not displayed.  Estimated Creatinine Clearance: 36.9 mL/min (by C-G formula based on SCr of 0.94 mg/dL).   Medications:  Prescriptions Prior to Admission  Medication Sig Dispense Refill Last Dose  . aspirin EC 81 MG tablet Take 81 mg by mouth daily.    02/05/2017 at 0800  . diltiazem (CARDIZEM LA) 240 MG 24 hr tablet Take 1 tablet (240 mg total) by mouth daily. (Patient taking differently: Take 240 mg by mouth 2 (two) times daily. ) 30 tablet 6 02/05/2017 at 0800  . Memantine HCl ER 21 MG CP24 Take 21 mg by mouth daily. 30 capsule 2 02/05/2017 at 0800  . Sennosides-Docusate Sodium (SENEXON-S PO) Take 2 tablets by mouth daily.   02/05/2017 at Unknown time  . Vitamin D, Ergocalciferol, (DRISDOL) 50000 UNITS CAPS capsule Take 50,000 Units by mouth every 7 (seven) days.    Past Week at Unknown time    Assessment: 10/20 @ 08:34:  HL = 0.2  Goal of Therapy:  Heparin level 0.3-0.7 units/ml Monitor platelets by anticoagulation protocol: Yes   Plan:  Will order  bolus of Heparin 900 units IV X 1 and increase drip rate to 900 units/hr. Will recheck HL 8 hrs after rate change at 18:00.   10/20 1931 HL <0.10. According to nurse heparin drip has been stopped for PICC placement. Heparin restarted at 2025. Will recheck HL ini 8 hours.   10/21 0500 HL = 0.63.  Will draw confirmation level in 8 hrs on 10/21 @ 13:00.    10/21 14:30 HL = 0.8.  Decreased drip rate to 850 units/hr.  Recheck HL in 8 hours tonight at 2330.  Stormy CardKatsoudas,Council Munguia K, Chi Health St. FrancisRPH Clinical Pharmacist 02/08/2017 3:23 PM

## 2017-02-08 NOTE — Progress Notes (Signed)
[facility]  Patient: Rebecca ReddenBirtie B Webb / Admit Date: 02/05/2017 / Date of Encounter: 02/08/2017, 3:16 PM   Subjective: Patient is hemodynamically stable at this time with borderline blood pressure  . Patient had issues with lower heart rate and lower blood pressure with diltiazem and needed discontinuation as well as a discontinuation of amiodarone. At that time the patient spontaneously converted back to normal sinus rhythm and has been remaining in hemodynamically stable. There is been no evidence of myocardial infarction or congestive heart failure at this time Review of Systems:  cannot assess due to obtundation and dementia Objective: Telemetry: Normal sinus rhythm Physical Exam: Blood pressure 136/77, pulse (!) 55, temperature 98 F (36.7 C), resp. rate (!) 27, height 5\' 2"  (1.575 m), weight 60.9 kg (134 lb 4.2 oz), SpO2 94 %. Body mass index is 24.56 kg/m. General: Well developed, well nourished, in no acute distress. Head: Normocephalic, atraumatic, sclera non-icteric, no xanthomas, nares are without discharge. Neck: No apparent masses Lungs: Normal respirations with no wheezes, no rhonchi, no rales , no crackles   Heart: irregular rate and rhythm, normal S1 S2, no murmur, no rub, no gallop, PMI is normal size and placement, carotid upstroke normal without bruit, jugular venous pressure normal Abdomen: Soft, non-tender, non-distended with normoactive bowel sounds. No hepatosplenomegaly. Abdominal aorta is normal size without bruit Extremities:trace edema, no clubbing, no cyanosis, no ulcers,  Peripheral: 2+ radial, 2+ femoral, 2+ dorsal pedal pulses Neuro: does notAlert and oriented. Moves all extremities spontaneously. Psych:  Does notResponds to questions appropriately with a normal affect.   Intake/Output Summary (Last 24 hours) at 02/08/17 1516 Last data filed at 02/08/17 1300  Gross per 24 hour  Intake           2038.3 ml  Output               980 ml  Net           1058.3 ml    Inpatient Medications:  . chlorhexidine  15 mL Mouth Rinse BID  . Influenza vac split quadrivalent PF  0.5 mL Intramuscular Tomorrow-1000  . mouth rinse  15 mL Mouth Rinse q12n4p  . memantine  21 mg Oral Daily  . pneumococcal 23 valent vaccine  0.5 mL Intramuscular Tomorrow-1000  . senna-docusate  2 tablet Oral QHS   Infusions:  . dextrose 5 % and 0.45 % NaCl with KCl 40 mEq/L 50 mL/hr at 02/08/17 1108  . heparin 900 Units/hr (02/08/17 0600)    Labs:  Recent Labs  02/06/17 0440 02/06/17 1641 02/07/17 0834 02/08/17 0452  NA 147*  --  146* 142  K 3.3* 3.2* 3.8 3.2*  CL 119*  --  120* 117*  CO2 19*  --  19* 21*  GLUCOSE 109*  --  90 125*  BUN 49*  --  31* 26*  CREATININE 1.02*  --  0.86 0.94  CALCIUM 7.5*  --  7.5* 7.9*  MG 2.3 2.3  --   --   PHOS  --  3.3  --   --    No results for input(s): AST, ALT, ALKPHOS, BILITOT, PROT, ALBUMIN in the last 72 hours.  Recent Labs  02/05/17 1715  02/07/17 0834 02/08/17 0452  WBC 19.1*  < > 13.1* 11.0  NEUTROABS 17.3*  --   --   --   HGB 11.2*  < > 9.2* 9.2*  HCT 33.1*  < > 28.7* 27.6*  MCV 91.5  < >  95.1 93.5  PLT 329  < > 273 272  < > = values in this interval not displayed. No results for input(s): CKTOTAL, CKMB, TROPONINI in the last 72 hours. Invalid input(s): POCBNP No results for input(s): HGBA1C in the last 72 hours.   Weights: Filed Weights   02/06/17 0542 02/07/17 0500 02/08/17 0200  Weight: 61 kg (134 lb 6.4 oz) 60.1 kg (132 lb 7.9 oz) 60.9 kg (134 lb 4.2 oz)     Radiology/Studies:  Dg Chest 1 View  Result Date: 02/05/2017 CLINICAL DATA:  Fall EXAM: CHEST 1 VIEW COMPARISON:  None. FINDINGS: Cardiomegaly without pulmonary edema. No focal airspace consolidation. There is retrocardiac opacity crossing the midline, suspected to be a hiatal hernia, new from prior studies. IMPRESSION: Cardiomegaly with retrocardiac opacities suspected to be a hiatal hernia. The retrocardiac  opacity is new from prior studies. A lateral chest radiograph would be helpful for confirmation. Electronically Signed   By: Deatra Robinson M.D.   On: 02/05/2017 18:18   Dg Hip Unilat W Or Wo Pelvis 2-3 Views Left  Result Date: 02/05/2017 CLINICAL DATA:  Fall with left hip pain. EXAM: DG HIP (WITH OR WITHOUT PELVIS) 2-3V LEFT COMPARISON:  None. FINDINGS: AP pelvis shows rightward rotation. There is diffuse bony demineralization. AP and frog-leg lateral views of the right hip show a complex femoral neck fracture with varus angulation. Fracture line in the proximal component containing the femoral head is curved and appears to have some remodeling along the fracture line. IMPRESSION: 1. Left femoral neck fracture with varus angulation. Imaging features are not entirely consistent with acute fracture and this may be an old fracture with bony remodeling. Pathologic fracture would also be a consideration. 2. Probable heterotopic ossification in the adjacent soft tissues suggesting nonacute injury. Electronically Signed   By: Kennith Center M.D.   On: 02/05/2017 18:23     Assessment and Recommendation  83 y.o. female with the known chronic kidney disease essential hypertensionwith the atrial fibrillation with rapid ventricular rate after above orthopedic fracture. The patient has had reasonable control ofheart rate with no evidence of heart failure or myocardial infarction. And now is spontaneously converted to normal sinus rhythm 1. No further heart rate control necessary with medications due to spontaneously conversion to normal sinus rhythm 2. Heparin for further risk reduction in stroke with atrial fibrillation and/or other deep venous thrombosis concerns and stop for orthopedic surgery due to concerns of bleeding risk 3. Proceed to orthopedic surgery as necessarywith control of above with patient being moderate risk at this time for cardiovascular complication although will need surgery for further  rehabilitation 4. No further cardiac diagnostics necessary at this time  Signed, Rebecca Webb M.D. FACC

## 2017-02-08 NOTE — Progress Notes (Signed)
Subjective:  Patient is an 83 year old female with advanced dementia who is in the ICU for atrial fibrillation and hypotension.  Patient now off Cardizem and amiodarone drips.  Her HR at the time of my exam is in the 90's.  Patient is breathing on her own, but unable to provide a history and she does not awaken to voice.  Patient appears very drowsy.  Objective:   VITALS:   Vitals:   02/08/17 1200 02/08/17 1300 02/08/17 1302 02/08/17 1600  BP: (!) 128/109 (!) 127/110 136/77 (!) 131/95  Pulse:    80  Resp: 17 (!) 24 (!) 27 19  Temp:      TempSrc:      SpO2:    99%  Weight:      Height:        PHYSICAL EXAM: Patient is sleeping.  She is in no acute distress.   Her left LE in Buck's traction.   She has palpable pedal pulses.   Motor and sensory function can't be assessed due to patient's drowsiness.   LABS  Results for orders placed or performed during the hospital encounter of 02/05/17 (from the past 24 hour(s))  Basic metabolic panel     Status: Abnormal   Collection Time: 02/08/17  4:52 AM  Result Value Ref Range   Sodium 142 135 - 145 mmol/L   Potassium 3.2 (L) 3.5 - 5.1 mmol/L   Chloride 117 (H) 101 - 111 mmol/L   CO2 21 (L) 22 - 32 mmol/L   Glucose, Bld 125 (H) 65 - 99 mg/dL   BUN 26 (H) 6 - 20 mg/dL   Creatinine, Ser 1.61 0.44 - 1.00 mg/dL   Calcium 7.9 (L) 8.9 - 10.3 mg/dL   GFR calc non Af Amer 53 (L) >60 mL/min   GFR calc Af Amer >60 >60 mL/min   Anion gap 4 (L) 5 - 15  CBC     Status: Abnormal   Collection Time: 02/08/17  4:52 AM  Result Value Ref Range   WBC 11.0 3.6 - 11.0 K/uL   RBC 2.95 (L) 3.80 - 5.20 MIL/uL   Hemoglobin 9.2 (L) 12.0 - 16.0 g/dL   HCT 09.6 (L) 04.5 - 40.9 %   MCV 93.5 80.0 - 100.0 fL   MCH 31.3 26.0 - 34.0 pg   MCHC 33.5 32.0 - 36.0 g/dL   RDW 81.1 91.4 - 78.2 %   Platelets 272 150 - 440 K/uL  Heparin level (unfractionated)     Status: None   Collection Time: 02/08/17  4:52 AM  Result Value Ref Range   Heparin Unfractionated 0.63  0.30 - 0.70 IU/mL  Glucose, capillary     Status: Abnormal   Collection Time: 02/08/17  8:08 AM  Result Value Ref Range   Glucose-Capillary 117 (H) 65 - 99 mg/dL  Heparin level (unfractionated)     Status: Abnormal   Collection Time: 02/08/17  2:31 PM  Result Value Ref Range   Heparin Unfractionated 0.80 (H) 0.30 - 0.70 IU/mL    No results found.  Assessment/Plan: * Surgery Date in Future *   Active Problems:   Closed left hip fracture, initial encounter [facility])  Patient awaiting surgical fixation for her left hip fracture.   Dr. Hyacinth Meeker possibly operating on the patient tomorrow if she is deemed medically stable.  NPO after midnight.  Heparin gtt will need to stop in the AM if surgery is planned for tomorrow afternoon.    Juanell Fairly , MD 02/08/2017,  8:11 PM

## 2017-02-08 NOTE — Progress Notes (Signed)
Name: Rebecca Webb MRN: [id number] DOB: 08/19/1928    ADMISSION DATE:  02/05/2017 CONSULTATION DATE: 02/06/2017  REFERRING MD : Dr. Allena Katz   CHIEF COMPLAINT: Atrial Fibrillation with RVR   BRIEF PATIENT DESCRIPTION:  83 yo female admitted to telemetry unit 10/18 with left hip fracture following a fall at home and afibb with rvr transferred to the stepdown unit 10/19 due to worsening afibb with rvr requiring amiodarone and cardizem gtts  SIGNIFICANT EVENTS  10/18-Pt admitted to the medsurg unit  10/19-Pt transferred to stepdown unit   STUDIES:  None  HISTORY OF PRESENT ILLNESS:   This is an 83 yo female with a PMH of Vitamin D Deficiency, HTN, Hyperlipidemia, GERD, Depression, Dementia, CKD, and Atrial Fibrillation.  She presented to [facility] ER 10/18 with left leg pain and difficulty walking following a fall at home.  Per ER notes she had a fall 4 days prior to presentation to the ER and was unable to bear weight on her left leg since the fall. In the ER hip xray revealed left femoral neck fracture unclear if old vs.acute fracture. An EKG revealed she was in atrial fibrillation with rvr heart rate 175, therefore a cardizem gtt was initiated.  She was subsequently admitted to the telemetry unit by hospitalist team for further workup and treatment.  Due to hypotension secondary to cardizem gtt she was transitioned to amiodarone gtt on 10/19, however she remained in afibb with rvr requiring transfer to the stepdown unit for initiation of both cardizem and amiodarone gtts.  SUBJECTIVE:  No acute issues overnight. Remains on precedex gtt; left leg immobilized. Off diltiazem but remains on heparin and amiodarone  VITAL SIGNS: Temp:  [97.6 F (36.4 C)-97.9 F (36.6 C)] 97.6 F (36.4 C) (10/21 0200) Pulse Rate:  [82-102] 102 (10/21 0400) Resp:  [8-31] 15 (10/21 0400) BP: (84-114)/(69-87) 99/77 (10/21 0400) SpO2:  [83 %-100 %] 83 % (10/21 0400) Weight:  [134 lb 4.2 oz (60.9 kg)] 134 lb 4.2  oz (60.9 kg) (10/21 0200)  PHYSICAL EXAMINATION: General: frail elderly female, agitated NAD  Neuro:Somnolent, awakens to voice and touch, confused HEENT: PERRLA, supple, no JVD Cardiovascular: irregular, irregular, no M/R/G Lungs: shallow respirations diminished throughout, even, non labored  Abdomen: +BS x4, soft, non distended  Musculoskeletal: left lower extremity brace in bucks traction, moves all extremities  Skin: intact no rashes or lesions    Recent Labs Lab 02/06/17 0440 02/06/17 1641 02/07/17 0834 02/08/17 0452  NA 147*  --  146* 142  K 3.3* 3.2* 3.8 3.2*  CL 119*  --  120* 117*  CO2 19*  --  19* 21*  BUN 49*  --  31* 26*  CREATININE 1.02*  --  0.86 0.94  GLUCOSE 109*  --  90 125*    Recent Labs Lab 02/06/17 0440 02/07/17 0834 02/08/17 0452  HGB 10.4* 9.2* 9.2*  HCT 31.3* 28.7* 27.6*  WBC 14.6* 13.1* 11.0  PLT 263 273 272   No results found.  ASSESSMENT / PLAN: Left hip fracture s/p fall  Atrial fibrillation with RVR Hypokalemia Acute renal failure  Leukocytosis  P: Continue amiodarone  Off cardizem gtt Precedex gtt; titrate to keep RASS 0 Neo-synephrine to keep map >65  Cardiology and Orthopedic consulted appreciate input  Continue heparin gtt  Trend CBC Monitor for s/sx of bleeding Transfuse hgb <7 Trend BMP  Replace electrolytes as indicated Monitor UOP  Prn morphine and norco for pain management  Continuous telemetry monitoring  GI and DVT  prophylaxis   Magdalene S. Tukov ANP-BC Pulmonary and Critical Care Medicine [facility] Pager [phone] or [phone]   PCCM ATTENDING ATTESTATION:  I have evaluated patient with the APP Luci Bankukov, reviewed database in its entirety and discussed care plan in detail. In addition, this patient was discussed on multidisciplinary rounds.   Still with intermittent agitation. Dexmedetomidine and amiodarone discontinued this AM due to bradycardia. She has a documented allergy to haloperidol.  Therefore, will use very low dose lorazepam. Her care needs exceed what can be administered outside of ICU/SDU  Billy Fischeravid Yosmar Ryker, MD PCCM service Mobile [phone] Pager [phone] 02/08/2017 11:30 AM

## 2017-02-08 NOTE — Progress Notes (Signed)
Spoke with Dr Bard HerbertSimmonds regarding patients heart rate upper 30's, mentation, being lethargic. At this point we will keep her in ICU. Amiod, precedex were discontinued. At this point patient is unable to swallow. Holding PO medication. Ativan ordered for patients agitation.

## 2017-02-08 NOTE — Progress Notes (Signed)
Spoke with Dr Gwen PoundsKowalski regarding patients heart rate dropping down to 38. Orders to stop amiodorone and watch patient.

## 2017-02-08 NOTE — Progress Notes (Signed)
ANTICOAGULATION CONSULT NOTE - Follow Up Consult  Pharmacy Consult for Heparin  Indication: atrial fibrillation  Allergies  Allergen Reactions  . Lisinopril Swelling    Tongue and lips was swollen.  . Haldol [Haloperidol Lactate] Rash    Patient Measurements: Height: 5\' 2"  (157.5 cm) Weight: 134 lb 4.2 oz (60.9 kg) IBW/kg (Calculated) : 50.1 Heparin Dosing Weight: 60.1kg   Vital Signs: Temp: 97.6 F (36.4 C) (10/21 0200) Temp Source: Axillary (10/21 0200) BP: 99/77 (10/21 0400) Pulse Rate: 102 (10/21 0400)  Labs:  Recent Labs  02/05/17 1800 02/06/17 0440  02/07/17 0834 02/07/17 1931 02/08/17 0452  HGB  --  10.4*  --  9.2*  --  9.2*  HCT  --  31.3*  --  28.7*  --  27.6*  PLT  --  263  --  273  --  272  LABPROT 14.8  --   --   --   --   --   INR 1.17  --   --   --   --   --   HEPARINUNFRC  --   --   < > 0.20* <0.10* 0.63  CREATININE  --  1.02*  --  0.86  --  0.94  < > = values in this interval not displayed.  Estimated Creatinine Clearance: 36.9 mL/min (by C-G formula based on SCr of 0.94 mg/dL).   Medications:  Prescriptions Prior to Admission  Medication Sig Dispense Refill Last Dose  . aspirin EC 81 MG tablet Take 81 mg by mouth daily.    02/05/2017 at 0800  . diltiazem (CARDIZEM LA) 240 MG 24 hr tablet Take 1 tablet (240 mg total) by mouth daily. (Patient taking differently: Take 240 mg by mouth 2 (two) times daily. ) 30 tablet 6 02/05/2017 at 0800  . Memantine HCl ER 21 MG CP24 Take 21 mg by mouth daily. 30 capsule 2 02/05/2017 at 0800  . Sennosides-Docusate Sodium (SENEXON-S PO) Take 2 tablets by mouth daily.   02/05/2017 at Unknown time  . Vitamin D, Ergocalciferol, (DRISDOL) 50000 UNITS CAPS capsule Take 50,000 Units by mouth every 7 (seven) days.    Past Week at Unknown time    Assessment: 10/20 @ 08:34:  HL = 0.2  Goal of Therapy:  Heparin level 0.3-0.7 units/ml Monitor platelets by anticoagulation protocol: Yes   Plan:  Will order bolus of  Heparin 900 units IV X 1 and increase drip rate to 900 units/hr. Will recheck HL 8 hrs after rate change at 18:00.   10/20 1931 HL <0.10. According to nurse heparin drip has been stopped for PICC placement. Heparin restarted at 2025. Will recheck HL ini 8 hours.   10/21 0500 HL = 0.63.  Will draw confirmation level in 8 hrs on 10/21 @ 13:00.   Scherrie Gerlachobbins,Sohil Timko D, PharmD, Clinical Pharmacist 02/08/2017 5:38 AM

## 2017-02-08 NOTE — Progress Notes (Signed)
[facility] [place] [facility]   PATIENT NAME: Rebecca Webb    MR#:  [id number]  DATE OF BIRTH:  [birth year]  SUBJECTIVE:  Sleepy. Unable to provide any history or review of system.  Pt was transferred y'day due to rapid afib requiring amiodarone and cardizem HR30-40's  Now off cardizem gtt and amiodarone gtt also Intermittent hypotension  REVIEW OF SYSTEMS:   Review of Systems  Unable to perform ROS: Dementia     DRUG ALLERGIES:   Allergies  Allergen Reactions  . Lisinopril Swelling    Tongue and lips was swollen.  . Haldol [Haloperidol Lactate] Rash    VITALS:  Blood pressure 136/77, pulse (!) 55, temperature 98 F (36.7 C), resp. rate (!) 27, height 5\' 2"  (1.575 m), weight 60.9 kg (134 lb 4.2 oz), SpO2 94 %.  PHYSICAL EXAMINATION:   Physical Exam  GENERAL:  83 y.o.-year-old patient lying in the bed with no acute distress. thin EYES: Pupils equal, round, reactive to light and accommodation. No scleral icterus. Extraocular muscles intact.  HEENT: Head atraumatic, normocephalic. Oropharynx and nasopharynx clear.  NECK:  Supple, no jugular venous distention. No thyroid enlargement, no tenderness.  LUNGS: Normal breath sounds bilaterally, no wheezing, rales, rhonchi. No use of accessory muscles of respiration.  CARDIOVASCULAR: S1, S2 normal. No murmurs, rubs, or gallops.  ABDOMEN: Soft, nontender, nondistended. Bowel sounds present. No organomegaly or mass.  EXTREMITIES: No cyanosis, clubbing or edema b/l.   Bucks traction left leg NEUROLOGIC: unable to assess PSYCHIATRIC:sleepy SKIN: No obvious rash, lesion, or ulcer.   LABORATORY PANEL:  CBC  Recent Labs Lab 02/08/17 0452  WBC 11.0  HGB 9.2*  HCT 27.6*  PLT 272    Chemistries   Recent Labs Lab 02/06/17 1641  02/08/17 0452  NA  --   < > 142  K 3.2*  < > 3.2*  CL  --   < > 117*  CO2  --   < > 21*  GLUCOSE  --   < > 125*  BUN  --   < > 26*  CREATININE  --   < > 0.94   CALCIUM  --   < > 7.9*  MG 2.3  --   --   < > = values in this interval not displayed. Cardiac Enzymes No results for input(s): TROPONINI in the last 168 hours. RADIOLOGY:  No results found. ASSESSMENT AND PLAN:  83 y.o. female with known essential hypertension mixed hyperlipidemia dementia chronic kidney disease stage III having recent fall with significant hip pain and probable fracture of for which the patient is unable to ambulate. The patient was seen in the emergency room for further treatment options and has noticed that the patient was having atrial fibrillation with rapid ventricular rate  #1 acute left hip fracture After mechanical fall at home 4 days ago -consulted orthopedics with Dr Hyacinth MeekerMiller -surgery on hold due to cardiac issues (afib and hypotension) and now bradycardia - pain control with IV Dilaudid 2 mg every 6 hours prn  #2 atrial fibrillation with RVR: Patient has chronic atrial fibrillation, on  Cardizem   -pt now bradycardic---off amiodarone and cardizem -on  IV heparin gtt  Since surgery on hold.  .#3 leukocytosis: Appears reactive  #4  acute kidney injury -creatinine 1.39--1.01--0.86 -Continue IV fluids.  #5 CODE STATUS full code, discussed the CODE STATUS with patient's granddaughter Rebecca HerrlichLisa Webb who wants full resuscitation.  consider Pallliative care consult if pt continues not to remain  hemodynamically stable for surgery  She says she is the Legal guardian and has paper work given to be placed in pt's chart.  Case discussed with Care Management/Social Worker. Management plans discussed with the family and they are in agreement.  CODE STATUS FULL per grand dter Rebecca Webb  DVT Prophylaxis: SCD, TEDS and heparin gtt  TOTAL TIME TAKING CARE OF THIS PATIENT: 30 minutes.  >50% time spent on counselling and coordination of care    Note: This dictation was prepared with Dragon dictation along with smaller phrase technology. Any transcriptional errors that  result from this process are unintentional.  Rebecca Webb M.D on 02/08/2017 at 4:49 PM  Between 7am to 6pm - Pager - [phone]  After 6pm go to [email or web address] - Social research officer, government  [facility]  Office  [phone]  CC: Primary care physician; Rebecca Webb, MDPatient ID: Rebecca Webb, female   DOB: 1928/08/09, 83 y.o.   MRN: [id number]

## 2017-02-09 DIAGNOSIS — S72002A Fracture of unspecified part of neck of left femur, initial encounter for closed fracture: Principal | ICD-10-CM

## 2017-02-09 DIAGNOSIS — I480 Paroxysmal atrial fibrillation: Secondary | ICD-10-CM

## 2017-02-09 LAB — COMPREHENSIVE METABOLIC PANEL
ALK PHOS: 65 U/L (ref 38–126)
ALT: 20 U/L (ref 14–54)
ANION GAP: 8 (ref 5–15)
AST: 23 U/L (ref 15–41)
Albumin: 2.4 g/dL — ABNORMAL LOW (ref 3.5–5.0)
BILIRUBIN TOTAL: 0.7 mg/dL (ref 0.3–1.2)
BUN: 24 mg/dL — AB (ref 6–20)
CHLORIDE: 116 mmol/L — AB (ref 101–111)
CO2: 21 mmol/L — ABNORMAL LOW (ref 22–32)
Calcium: 7.9 mg/dL — ABNORMAL LOW (ref 8.9–10.3)
Creatinine, Ser: 1.4 mg/dL — ABNORMAL HIGH (ref 0.44–1.00)
GFR calc Af Amer: 38 mL/min — ABNORMAL LOW (ref >60)
GFR, EST NON AFRICAN AMERICAN: 33 mL/min — AB (ref >60)
GLUCOSE: 134 mg/dL — AB (ref 65–99)
POTASSIUM: 3.6 mmol/L (ref 3.5–5.1)
Sodium: 145 mmol/L (ref 135–145)
TOTAL PROTEIN: 5.6 g/dL — AB (ref 6.5–8.1)

## 2017-02-09 LAB — CBC
HEMATOCRIT: 30.1 % — AB (ref 35.0–47.0)
Hemoglobin: 9.9 g/dL — ABNORMAL LOW (ref 12.0–16.0)
MCH: 30.6 pg (ref 26.0–34.0)
MCHC: 33 g/dL (ref 32.0–36.0)
MCV: 92.8 fL (ref 80.0–100.0)
Platelets: 315 10*3/uL (ref 150–440)
RBC: 3.24 MIL/uL — ABNORMAL LOW (ref 3.80–5.20)
RDW: 14.6 % — AB (ref 11.5–14.5)
WBC: 13.2 10*3/uL — ABNORMAL HIGH (ref 3.6–11.0)

## 2017-02-09 LAB — HEPARIN LEVEL (UNFRACTIONATED)
HEPARIN UNFRACTIONATED: 0.15 [IU]/mL — AB (ref 0.30–0.70)
Heparin Unfractionated: 1.13 IU/mL — ABNORMAL HIGH (ref 0.30–0.70)
Heparin Unfractionated: <0.1 IU/mL — ABNORMAL LOW (ref 0.30–0.70)

## 2017-02-09 LAB — TROPONIN I
Troponin I: <0.03 ng/mL (ref <0.03)
Troponin I: <0.03 ng/mL (ref <0.03)

## 2017-02-09 LAB — MAGNESIUM: Magnesium: 1.6 mg/dL — ABNORMAL LOW (ref 1.7–2.4)

## 2017-02-09 LAB — PHOSPHORUS: Phosphorus: 2.7 mg/dL (ref 2.5–4.6)

## 2017-02-09 MED ORDER — AMIODARONE IV BOLUS ONLY 150 MG/100ML: 150.0000 mg | Freq: Once | INTRAVENOUS | Status: AC

## 2017-02-09 MED ORDER — CLINDAMYCIN PHOSPHATE 900 MG/50ML IV SOLN
900.0000 mg | INTRAVENOUS | Status: DC
  Filled 2017-02-09: qty 50

## 2017-02-09 MED ORDER — MAGNESIUM SULFATE 4 GM/100ML IV SOLN
4.0000 g | Freq: Once | INTRAVENOUS | Status: AC
  Administered 2017-02-09: 4 g via INTRAVENOUS
  Filled 2017-02-09: qty 100

## 2017-02-09 MED ORDER — STERILE WATER FOR INJECTION IJ SOLN
INTRAMUSCULAR | Status: AC
  Administered 2017-02-09: 22:00:00
  Filled 2017-02-09: qty 10

## 2017-02-09 MED ORDER — CHLORHEXIDINE GLUCONATE 4 % EX LIQD
60.0000 mL | Freq: Once | CUTANEOUS | Status: AC
  Administered 2017-02-09: 4 via TOPICAL

## 2017-02-09 MED ORDER — ALTEPLASE 2 MG IJ SOLR
2.0000 mg | Freq: Once | INTRAMUSCULAR | Status: AC
  Administered 2017-02-09: 2 mg
  Filled 2017-02-09: qty 2

## 2017-02-09 MED ORDER — HEPARIN BOLUS VIA INFUSION
900.0000 [IU] | Freq: Once | INTRAVENOUS | Status: AC
  Administered 2017-02-09: 900 [IU] via INTRAVENOUS
  Filled 2017-02-09: qty 900

## 2017-02-09 MED ORDER — DEXTROSE 5 % IV SOLN
2000.0000 mg | INTRAVENOUS | Status: DC
  Filled 2017-02-09: qty 20

## 2017-02-09 MED ORDER — AMIODARONE HCL IN DEXTROSE 360-4.14 MG/200ML-% IV SOLN
INTRAVENOUS | Status: AC
  Administered 2017-02-09: 02:00:00
  Filled 2017-02-09: qty 200

## 2017-02-09 MED ORDER — CEFAZOLIN SODIUM-DEXTROSE 2-4 GM/100ML-% IV SOLN
2.0000 g | INTRAVENOUS | Status: DC
  Filled 2017-02-09: qty 100

## 2017-02-09 MED ORDER — AMIODARONE HCL IN DEXTROSE 360-4.14 MG/200ML-% IV SOLN
60.0000 mg/h | INTRAVENOUS | Status: DC
  Administered 2017-02-09 (×2): 60 mg/h via INTRAVENOUS
  Filled 2017-02-09: qty 200

## 2017-02-09 MED ORDER — HEPARIN (PORCINE) IN NACL 100-0.45 UNIT/ML-% IJ SOLN
1000.0000 [IU]/h | INTRAMUSCULAR | Status: DC
  Administered 2017-02-09: 600 [IU]/h via INTRAVENOUS
  Administered 2017-02-10: 850 [IU]/h via INTRAVENOUS
  Filled 2017-02-09: qty 250

## 2017-02-09 MED ORDER — HEPARIN BOLUS VIA INFUSION
1850.0000 [IU] | Freq: Once | INTRAVENOUS | Status: AC
  Administered 2017-02-09: 1850 [IU] via INTRAVENOUS
  Filled 2017-02-09: qty 1850

## 2017-02-09 MED ORDER — AMIODARONE HCL IN DEXTROSE 360-4.14 MG/200ML-% IV SOLN
30.0000 mg/h | INTRAVENOUS | Status: DC
  Administered 2017-02-09: 59.94 mg/h via INTRAVENOUS
  Administered 2017-02-09 (×2): 60 mg/h via INTRAVENOUS
  Administered 2017-02-10: 30 mg/h via INTRAVENOUS
  Filled 2017-02-09 (×3): qty 200

## 2017-02-09 NOTE — Progress Notes (Signed)
[facility] - [place] at [facility]   PATIENT NAME: Rebecca Webb    MR#:  [id number]  DATE OF BIRTH:  10-13-1928  SUBJECTIVE:  Sleepy. Unable to provide any history or review of system.  -pt back in rapid afib overnite on IV amiodarone gtt Intermittent hypotension IV heparin gtt  REVIEW OF SYSTEMS:   Review of Systems  Unable to perform ROS: Dementia   DRUG ALLERGIES:   Allergies  Allergen Reactions  . Lisinopril Swelling    Tongue and lips was swollen.  . Haldol [Haloperidol Lactate] Rash    VITALS:  Blood pressure 120/67, pulse (!) 107, temperature 98.4 F (36.9 C), temperature source Axillary, resp. rate 15, height 5\' 2"  (1.575 m), weight 60.9 kg (134 lb 4.2 oz), SpO2 100 %.  PHYSICAL EXAMINATION:   Physical Exam  GENERAL:  83 y.o.-year-old patient lying in the bed with no acute distress. thin EYES: Pupils equal, round, reactive to light and accommodation. No scleral icterus. Extraocular muscles intact.  HEENT: Head atraumatic, normocephalic. Oropharynx and nasopharynx clear.  NECK:  Supple, no jugular venous distention. No thyroid enlargement, no tenderness.  LUNGS: Normal breath sounds bilaterally, no wheezing, rales, rhonchi. No use of accessory muscles of respiration.  CARDIOVASCULAR: S1, S2 normal. No murmurs, rubs, or gallops. Tachycardia+ ABDOMEN: Soft, nontender, nondistended. Bowel sounds present. No organomegaly or mass.  EXTREMITIES: No cyanosis, clubbing or edema b/l.   Bucks traction left leg NEUROLOGIC: unable to assess PSYCHIATRIC:sleepy SKIN: No obvious rash, lesion, or ulcer.   LABORATORY PANEL:  CBC  Recent Labs Lab 02/09/17 0436  WBC 13.2*  HGB 9.9*  HCT 30.1*  PLT 315    Chemistries   Recent Labs Lab 02/09/17 0436  NA 145  K 3.6  CL 116*  CO2 21*  GLUCOSE 134*  BUN 24*  CREATININE 1.40*  CALCIUM 7.9*  MG 1.6*  AST 23  ALT 20  ALKPHOS 65  BILITOT 0.7   Cardiac Enzymes  Recent Labs Lab  02/09/17 0821  TROPONINI <0.03   RADIOLOGY:  No results found. ASSESSMENT AND PLAN:  83 y.o. female with known essential hypertension mixed hyperlipidemia dementia chronic kidney disease stage III having recent fall with significant hip pain and probable fracture of for which the patient is unable to ambulate. The patient was seen in the emergency room for further treatment options and has noticed that the patient was having atrial fibrillation with rapid ventricular rate  #1 acute left hip fracture After mechanical fall at home 4 days ago -consulted orthopedics with Dr Hyacinth Meeker -surgery on hold due to cardiac issues (afib and hypotension) - pain control with IV Dilaudid 2 mg every 6 hours prn  #2 atrial fibrillation with RVR: Patient has chronic atrial fibrillation, on  Cardizem   -pt was bradycardic- and therefore off amiodarone and cardizem---however now back in RVR so on IV amiodarone today -on  IV heparin gtt    .#3 leukocytosis: Appears reactive  #4  acute kidney injury -creatinine 1.39--1.01--0.86-- 1.40 -Continue IV fluids.  #5 CODE STATUS full code, discussed the CODE STATUS with patient's granddaughter Lyndle Herrlich who wants full resuscitation.  consider Pallliative care consult if pt continues not to remain hemodynamically stable for surgery  She says she is the Legal guardian and has paper work given to be placed in pt's chart.  Case discussed with Care Management/Social Worker. Management plans discussed with the family and they are in agreement.  CODE STATUS FULL per grand dter Misty Stanley  DVT Prophylaxis:  SCD, TEDS and heparin gtt  TOTAL TIME TAKING CARE OF THIS PATIENT: 30 minutes.  >50% time spent on counselling and coordination of care  Note: This dictation was prepared with Dragon dictation along with smaller phrase technology. Any transcriptional errors that result from this process are unintentional.  Nolie Bignell M.D on 02/09/2017 at 12:49 PM  Between 7am to  6pm - Pager - [phone]  After 6pm go to [email or web address] - Social research officer, governmentpassword [employer] [facility]  [facility]  Office  [phone]  CC: Primary care physician; Leotis ShamesSingh, Jasmine, MDPatient ID: Rebecca ReddenBirtie B Webb, female   DOB: [birth year], 83 y.o.   MRN: [id number]

## 2017-02-09 NOTE — Progress Notes (Signed)
ANTICOAGULATION CONSULT NOTE - Follow Up Consult  Pharmacy Consult for Heparin  Indication: atrial fibrillation  Allergies  Allergen Reactions  . Lisinopril Swelling    Tongue and lips was swollen.  . Haldol [Haloperidol Lactate] Rash    Patient Measurements: Height: 5\' 2"  (157.5 cm) Weight: 134 lb 4.2 oz (60.9 kg) IBW/kg (Calculated) : 50.1 Heparin Dosing Weight: 60.1kg   Vital Signs: Temp: 97.7 F (36.5 C) (10/22 1600) Temp Source: Axillary (10/22 0700) BP: 105/78 (10/22 1600) Pulse Rate: 118 (10/22 1600)  Labs:  Recent Labs  02/07/17 0834  02/08/17 0452 02/08/17 1431 02/08/17 2348 02/09/17 0201 02/09/17 0436 02/09/17 0821 02/09/17 0952  HGB 9.2*  --  9.2*  --   --   --  9.9*  --   --   HCT 28.7*  --  27.6*  --   --   --  30.1*  --   --   PLT 273  --  272  --   --   --  315  --   --   HEPARINUNFRC 0.20*  < > 0.63 0.80* 1.13*  --   --   --  <0.10*  CREATININE 0.86  --  0.94  --   --   --  1.40*  --   --   TROPONINI  --   --   --   --   --  <0.03  --  <0.03  --   < > = values in this interval not displayed.  Estimated Creatinine Clearance: 24.8 mL/min (A) (by C-G formula based on SCr of 1.4 mg/dL (H)).   Medications:  Prescriptions Prior to Admission  Medication Sig Dispense Refill Last Dose  . aspirin EC 81 MG tablet Take 81 mg by mouth daily.    02/05/2017 at 0800  . diltiazem (CARDIZEM LA) 240 MG 24 hr tablet Take 1 tablet (240 mg total) by mouth daily. (Patient taking differently: Take 240 mg by mouth 2 (two) times daily. ) 30 tablet 6 02/05/2017 at 0800  . Memantine HCl ER 21 MG CP24 Take 21 mg by mouth daily. 30 capsule 2 02/05/2017 at 0800  . Sennosides-Docusate Sodium (SENEXON-S PO) Take 2 tablets by mouth daily.   02/05/2017 at Unknown time  . Vitamin D, Ergocalciferol, (DRISDOL) 50000 UNITS CAPS capsule Take 50,000 Units by mouth every 7 (seven) days.    Past Week at Unknown time    Assessment: Pharmacy consulted for heparin drip management for an  83 year old female who presents after a fall, found to be in Afib RVR. Pt w/ hx of afib, not on anticoagulation due to fall risk. Patient receiving heparin at 600 units/hr; decreased from 850 units/hr due to elevated anti-Xa level.   Goal of Therapy:  Heparin level 0.3-0.7 units/ml Monitor platelets by anticoagulation protocol: Yes   Plan:  Per RN, heparin has not been held since being resumed at 0205. Will bolus 1500 units x 1 and increase rate to 750 units/hr. Will recheck anti-Xa level at 2000.   Pharmacy will continue to monitor and adjust per consult.    Yaritza Leist L 02/09/2017 4:30 PM

## 2017-02-09 NOTE — Progress Notes (Signed)
ANTICOAGULATION CONSULT NOTE - Follow Up Consult  Pharmacy Consult for Heparin  Indication: atrial fibrillation  Allergies  Allergen Reactions  . Lisinopril Swelling    Tongue and lips was swollen.  . Haldol [Haloperidol Lactate] Rash    Patient Measurements: Height: 5\' 2"  (157.5 cm) Weight: 134 lb 4.2 oz (60.9 kg) IBW/kg (Calculated) : 50.1 Heparin Dosing Weight: 60.1kg   Vital Signs: Temp: 97.9 F (36.6 C) (10/22 1925) Temp Source: Axillary (10/22 1925) BP: 126/78 (10/22 2230) Pulse Rate: 126 (10/22 2230)  Labs:  Recent Labs  02/07/17 0834  02/08/17 0452  02/08/17 2348 02/09/17 0201 02/09/17 0436 02/09/17 0821 02/09/17 0952 02/09/17 2038  HGB 9.2*  --  9.2*  --   --   --  9.9*  --   --   --   HCT 28.7*  --  27.6*  --   --   --  30.1*  --   --   --   PLT 273  --  272  --   --   --  315  --   --   --   HEPARINUNFRC 0.20*  < > 0.63  < > 1.13*  --   --   --  <0.10* 0.15*  CREATININE 0.86  --  0.94  --   --   --  1.40*  --   --   --   TROPONINI  --   --   --   --   --  <0.03  --  <0.03  --   --   < > = values in this interval not displayed.  Estimated Creatinine Clearance: 24.8 mL/min (A) (by C-G formula based on SCr of 1.4 mg/dL (H)).   Medications:  Prescriptions Prior to Admission  Medication Sig Dispense Refill Last Dose  . aspirin EC 81 MG tablet Take 81 mg by mouth daily.    02/05/2017 at 0800  . diltiazem (CARDIZEM LA) 240 MG 24 hr tablet Take 1 tablet (240 mg total) by mouth daily. (Patient taking differently: Take 240 mg by mouth 2 (two) times daily. ) 30 tablet 6 02/05/2017 at 0800  . Memantine HCl ER 21 MG CP24 Take 21 mg by mouth daily. 30 capsule 2 02/05/2017 at 0800  . Sennosides-Docusate Sodium (SENEXON-S PO) Take 2 tablets by mouth daily.   02/05/2017 at Unknown time  . Vitamin D, Ergocalciferol, (DRISDOL) 50000 UNITS CAPS capsule Take 50,000 Units by mouth every 7 (seven) days.    Past Week at Unknown time    Assessment: Pharmacy consulted for  heparin drip management for an 83 year old female who presents after a fall, found to be in Afib RVR. Pt w/ hx of afib, not on anticoagulation due to fall risk. Patient receiving heparin at 600 units/hr; decreased from 850 units/hr due to elevated anti-Xa level.   Goal of Therapy:  Heparin level 0.3-0.7 units/ml Monitor platelets by anticoagulation protocol: Yes   Plan:  Per RN, heparin has not been held since being resumed at 0205. Will bolus 1500 units x 1 and increase rate to 750 units/hr. Will recheck anti-Xa level at 2000.   10/22 @ 2030 HL 0.15 subtherapeutic. Will rebolus w/ heparin 1850 units IV x 1 and will increase rate to 850 units/hr and will recheck HL @ 0700.  Pharmacy will continue to monitor and adjust per consult.   Thomasene Rippleavid Knox Holdman, PharmD, BCPS Clinical Pharmacist 02/09/2017

## 2017-02-09 NOTE — Progress Notes (Signed)
Lab technician unable to obtain coags at this time.

## 2017-02-09 NOTE — Progress Notes (Signed)
Rough day. Surgery cancelled due to A. Fib. and Amiodorone drip by Anesthesia. Stable on room air. Given morphine x 2 for leg pain. Quieted down nicely after pain medicine. Bathed at change of shift.

## 2017-02-09 NOTE — Progress Notes (Signed)
[facility]  Patient: Rebecca ReddenBirtie B Webb / Admit Date: 02/05/2017 / Date of Encounter: 02/09/2017, 8:37 AM   Subjective: Patient is hemodynamically stable at this time with borderline blood pressure  . Patient had issues with lower heart rate and lower blood pressure with diltiazem and needed discontinuation as well as a discontinuation of amiodarone. At that time the patient spontaneously converted back to normal sinus rhythm and now unfortunately has had a recurrence of atrial fibrillation with rapid ventricular rate. There is been no evidence of myocardial infarction or congestive heart failure at this time despite increased heart rate Review of Systems:  cannot assess due to obtundation and dementia Objective: Telemetry: Atrial fibrillation with rapid ventricular rate Physical Exam: Blood pressure 120/85, pulse 78, temperature 98.4 F (36.9 C), temperature source Axillary, resp. rate (!) 23, height 5\' 2"  (1.575 m), weight 60.9 kg (134 lb 4.2 oz), SpO2 100 %. Body mass index is 24.56 kg/m. General: Well developed, well nourished, in no acute distress. Head: Normocephalic, atraumatic, sclera non-icteric, no xanthomas, nares are without discharge. Neck: No apparent masses Lungs: Normal respirations with no wheezes, no rhonchi, no rales , no crackles   Heart: irregular rate and rhythm, normal S1 S2, no murmur, no rub, no gallop, PMI is normal size and placement, carotid upstroke normal without bruit, jugular venous pressure normal Abdomen: Soft, non-tender, non-distended with normoactive bowel sounds. No hepatosplenomegaly. Abdominal aorta is normal size without bruit Extremities:trace edema, no clubbing, no cyanosis, no ulcers,  Peripheral: 2+ radial, 2+ femoral, 2+ dorsal pedal pulses Neuro: does notAlert and oriented. Moves all extremities spontaneously. Psych:  Does notResponds to questions appropriately with a normal affect.   Intake/Output Summary  (Last 24 hours) at 02/09/17 0837 Last data filed at 02/09/17 0700  Gross per 24 hour  Intake           519.46 ml  Output             2425 ml  Net         -1905.54 ml    Inpatient Medications:  . chlorhexidine  15 mL Mouth Rinse BID  . Influenza vac split quadrivalent PF  0.5 mL Intramuscular Tomorrow-1000  . mouth rinse  15 mL Mouth Rinse q12n4p  . memantine  21 mg Oral Daily  . pneumococcal 23 valent vaccine  0.5 mL Intramuscular Tomorrow-1000  . senna-docusate  2 tablet Oral QHS   Infusions:  . amiodarone 30 mg/hr (02/09/17 0800)  . ceFAZolin (ANCEF) IVPB - custom    . clindamycin (CLEOCIN) IV    . dextrose 5 % and 0.45 % NaCl with KCl 40 mEq/L 50 mL/hr at 02/08/17 2000  . heparin 600 Units/hr (02/09/17 0205)    Labs:  Recent Labs  02/06/17 1641  02/08/17 0452 02/09/17 0436  NA  --   < > 142 145  K 3.2*  < > 3.2* 3.6  CL  --   < > 117* 116*  CO2  --   < > 21* 21*  GLUCOSE  --   < > 125* 134*  BUN  --   < > 26* 24*  CREATININE  --   < > 0.94 1.40*  CALCIUM  --   < > 7.9* 7.9*  MG 2.3  --   --  1.6*  PHOS 3.3  --   --  2.7  < > = values in this interval not displayed.  Recent Labs  02/09/17 0436  AST 23  ALT  20  ALKPHOS 65  BILITOT 0.7  PROT 5.6*  ALBUMIN 2.4*    Recent Labs  02/08/17 0452 02/09/17 0436  WBC 11.0 13.2*  HGB 9.2* 9.9*  HCT 27.6* 30.1*  MCV 93.5 92.8  PLT 272 315    Recent Labs  02/09/17 0201  TROPONINI <0.03   Invalid input(s): POCBNP No results for input(s): HGBA1C in the last 72 hours.   Weights: Filed Weights   02/06/17 0542 02/07/17 0500 02/08/17 0200  Weight: 61 kg (134 lb 6.4 oz) 60.1 kg (132 lb 7.9 oz) 60.9 kg (134 lb 4.2 oz)     Radiology/Studies:  Dg Chest 1 View  Result Date: 02/05/2017 CLINICAL DATA:  Fall EXAM: CHEST 1 VIEW COMPARISON:  None. FINDINGS: Cardiomegaly without pulmonary edema. No focal airspace consolidation. There is retrocardiac opacity crossing the midline, suspected to be a hiatal hernia,  new from prior studies. IMPRESSION: Cardiomegaly with retrocardiac opacities suspected to be a hiatal hernia. The retrocardiac opacity is new from prior studies. A lateral chest radiograph would be helpful for confirmation. Electronically Signed   By: Deatra Robinson M.D.   On: 02/05/2017 18:18   Dg Hip Unilat W Or Wo Pelvis 2-3 Views Left  Result Date: 02/05/2017 CLINICAL DATA:  Fall with left hip pain. EXAM: DG HIP (WITH OR WITHOUT PELVIS) 2-3V LEFT COMPARISON:  None. FINDINGS: AP pelvis shows rightward rotation. There is diffuse bony demineralization. AP and frog-leg lateral views of the right hip show a complex femoral neck fracture with varus angulation. Fracture line in the proximal component containing the femoral head is curved and appears to have some remodeling along the fracture line. IMPRESSION: 1. Left femoral neck fracture with varus angulation. Imaging features are not entirely consistent with acute fracture and this may be an old fracture with bony remodeling. Pathologic fracture would also be a consideration. 2. Probable heterotopic ossification in the adjacent soft tissues suggesting nonacute injury. Electronically Signed   By: Kennith Center M.D.   On: 02/05/2017 18:23     Assessment and Recommendation  83 y.o. female with the known chronic kidney disease essential hypertensionwith the atrial fibrillation with rapid ventricular rate after above orthopedic fracture. The patient has had reasonable control ofheart rate with no evidence of heart failure or myocardial infarction. And now is having some control with amiodarone 1. Continue amiodarone for possible spontaneous conversion to normal sinus rhythm and heart rate control and reinstate diltiazem IV drip up to 15 mg/h if needed for heart rate control below 110 bpm 2. Heparin for further risk reduction in stroke with atrial fibrillation and/or other deep venous thrombosis concerns and stop for orthopedic surgery due to concerns of bleeding  risk 3. Proceed to orthopedic surgery as necessarywith control of above with patient being moderate risk at this time for cardiovascular complication although will need surgery for further rehabilitation 4. No further cardiac diagnostics necessary at this time  Signed, Arnoldo Hooker M.D. FACC

## 2017-02-09 NOTE — Progress Notes (Signed)
Notified Maggie, NP of [facility] physician ( Dr Vassie LollAlva) permission to stick patient in the foot.  The patient's PICC line (both ports) do not draw back blood.  She stated to pause the amiodarone gtt for 30 minutes and Tpa the line.

## 2017-02-09 NOTE — Progress Notes (Addendum)
ANTICOAGULATION CONSULT NOTE - Follow Up Consult  Pharmacy Consult for Heparin  Indication: atrial fibrillation  Allergies  Allergen Reactions  . Lisinopril Swelling    Tongue and lips was swollen.  . Haldol [Haloperidol Lactate] Rash    Patient Measurements: Height: 5\' 2"  (157.5 cm) Weight: 134 lb 4.2 oz (60.9 kg) IBW/kg (Calculated) : 50.1 Heparin Dosing Weight: 60.1kg   Vital Signs: Temp: 98.7 F (37.1 C) (10/21 2000) Temp Source: Axillary (10/21 2000) BP: 129/87 (10/21 2000) Pulse Rate: 78 (10/21 2000)  Labs:  Recent Labs  02/06/17 0440  02/07/17 0834  02/08/17 0452 02/08/17 1431 02/08/17 2348  HGB 10.4*  --  9.2*  --  9.2*  --   --   HCT 31.3*  --  28.7*  --  27.6*  --   --   PLT 263  --  273  --  272  --   --   HEPARINUNFRC  --   < > 0.20*  < > 0.63 0.80* 1.13*  CREATININE 1.02*  --  0.86  --  0.94  --   --   < > = values in this interval not displayed.  Estimated Creatinine Clearance: 36.9 mL/min (by C-G formula based on SCr of 0.94 mg/dL).   Medications:  Prescriptions Prior to Admission  Medication Sig Dispense Refill Last Dose  . aspirin EC 81 MG tablet Take 81 mg by mouth daily.    02/05/2017 at 0800  . diltiazem (CARDIZEM LA) 240 MG 24 hr tablet Take 1 tablet (240 mg total) by mouth daily. (Patient taking differently: Take 240 mg by mouth 2 (two) times daily. ) 30 tablet 6 02/05/2017 at 0800  . Memantine HCl ER 21 MG CP24 Take 21 mg by mouth daily. 30 capsule 2 02/05/2017 at 0800  . Sennosides-Docusate Sodium (SENEXON-S PO) Take 2 tablets by mouth daily.   02/05/2017 at Unknown time  . Vitamin D, Ergocalciferol, (DRISDOL) 50000 UNITS CAPS capsule Take 50,000 Units by mouth every 7 (seven) days.    Past Week at Unknown time    Assessment: 10/20 @ 08:34:  HL = 0.2  Goal of Therapy:  Heparin level 0.3-0.7 units/ml Monitor platelets by anticoagulation protocol: Yes   Plan:  Will order bolus of Heparin 900 units IV X 1 and increase drip rate to 900  units/hr. Will recheck HL 8 hrs after rate change at 18:00.   10/20 1931 HL <0.10. According to nurse heparin drip has been stopped for PICC placement. Heparin restarted at 2025. Will recheck HL ini 8 hours.   10/21 0500 HL = 0.63.  Will draw confirmation level in 8 hrs on 10/21 @ 13:00.    10/21 14:30 HL = 0.8.  Decreased drip rate to 850 units/hr.  Recheck HL in 8 hours tonight at 2330.  10/21 23:30 HL = 1.13.  Will hold heparin drip for 1 hr and restart on 10/22 @ 0230 at 600 units/hr. Will recheck HL 8 hrs after rate change.           Note:  Told RN around 1:30 to d/c gtt and hold for 1 hr, entered drip to resume at 2:30.  Heparin gtt was restarted @ 2:05 by RN.   Scherrie GerlachRobbins,Jamael Hoffmann D, [facility] CenterRPH Clinical Pharmacist 02/09/2017 1:29 AM

## 2017-02-09 NOTE — Progress Notes (Signed)
MEDICATION RELATED CONSULT NOTE   Pharmacy Consult for electrolyte management for 83 yo female ICU patient admitted with left hip fracture after fall at home. Patient is currently in afib; requiring amiodarone and being anticoagulated with a heparin drip.  Plan:  Fluids stopped. Will order magnesium 4 gram IV x 1. Will recheck electrolytes with am labs.    Allergies  Allergen Reactions  . Lisinopril Swelling    Tongue and lips was swollen.  . Haldol [Haloperidol Lactate] Rash    Patient Measurements: Height: 5\' 2"  (157.5 cm) Weight: 134 lb 4.2 oz (60.9 kg) IBW/kg (Calculated) : 50.1  Vital Signs: Temp: 98.4 F (36.9 C) (10/22 0700) Temp Source: Axillary (10/22 0700) BP: 120/67 (10/22 0800) Pulse Rate: 107 (10/22 0800) Intake/Output from previous day: 10/21 0701 - 10/22 0700 In: 519.5 [I.V.:519.5] Out: 2425 [Urine:2425] Intake/Output from this shift: No intake/output data recorded.  Labs:  Recent Labs  02/06/17 1641 02/07/17 0834 02/08/17 0452 02/09/17 0436  WBC  --  13.1* 11.0 13.2*  HGB  --  9.2* 9.2* 9.9*  HCT  --  28.7* 27.6* 30.1*  PLT  --  273 272 315  CREATININE  --  0.86 0.94 1.40*  MG 2.3  --   --  1.6*  PHOS 3.3  --   --  2.7  ALBUMIN  --   --   --  2.4*  PROT  --   --   --  5.6*  AST  --   --   --  23  ALT  --   --   --  20  ALKPHOS  --   --   --  65  BILITOT  --   --   --  0.7   Estimated Creatinine Clearance: 24.8 mL/min (A) (by C-G formula based on SCr of 1.4 mg/dL (H)).    Medical History: Past Medical History:  Diagnosis Date  . Atrial fibrillation (HCC)   . Chronic kidney disease   . Dementia   . Dementia   . Depression   . GERD (gastroesophageal reflux disease)   . Hyperlipemia   . Hypertension   . Vitamin D deficiency    Pharmacy will continue to monitor and adjust per consult.   Simpson,Michael L 02/09/2017,3:16 PM

## 2017-02-09 NOTE — Progress Notes (Signed)
   Name: Rebecca Webb MRN: [id number] DOB: [birth year]    ADMISSION DATE:  02/05/2017 CONSULTATION DATE: 02/06/2017  REFERRING MD : Dr. Allena KatzPatel   CHIEF COMPLAINT: Atrial Fibrillation with RVR   BRIEF PATIENT DESCRIPTION:  83 yo female admitted to telemetry unit 10/18 with left hip fracture following a fall at home and afibb with rvr transferred to the stepdown unit 10/19 due to worsening afibb with rvr requiring amiodarone and cardizem gtts  SIGNIFICANT EVENTS  10/18-Pt admitted to the medsurg unit  10/19-Pt transferred to stepdown unit   STUDIES:  None  HISTORY OF PRESENT ILLNESS:   This is an 83 yo female with a PMH of Vitamin D Deficiency, HTN, Hyperlipidemia, GERD, Depression, Dementia, CKD, and Atrial Fibrillation.  [facility] with left leg pain and difficulty walking following a fall at home.  Per ER notes she had a fall 4 days prior to presentation to the ER and was unable to bear weight on her left leg since the fall. In the ER hip xray revealed left femoral neck fracture unclear if old vs.acute fracture. An EKG revealed she was in atrial fibrillation with rvr heart rate 175, therefore a cardizem gtt was initiated.  She was subsequently admitted to the telemetry unit by hospitalist team for further workup and treatment.  Due to hypotension secondary to cardizem gtt she was transitioned to amiodarone gtt on 10/19, however she remained in afibb with rvr requiring transfer to the stepdown unit for initiation of both cardizem and amiodarone gtts.  SUBJECTIVE:  No acute issues overnight. Off diltiazem but remains on heparin and amiodarone Lethargic and sleepy  VITAL SIGNS: Temp:  [98.4 F (36.9 C)-98.7 F (37.1 C)] 98.4 F (36.9 C) (10/22 0700) Pulse Rate:  [41-80] 78 (10/21 2000) Resp:  [13-27] 23 (10/21 2000) BP: (88-136)/(60-110) 120/85 (10/22 0700) SpO2:  [94 %-100 %] 100 % (10/21 2000)   PHYSICAL EXAMINATION: General: frail elderly female, agitated NAD   Neuro:Somnolent, awakens to voice and touch, confused HEENT: PERRLA, supple, no JVD Cardiovascular: irregular, irregular, no M/R/G Lungs: shallow respirations diminished throughout, even, non labored  Abdomen: +BS x4, soft, non distended  Musculoskeletal: left lower extremity brace in bucks traction, moves all extremities  Skin: intact no rashes or lesions   Recent Labs Lab 02/07/17 0834 02/08/17 0452 02/09/17 0436  NA 146* 142 145  K 3.8 3.2* 3.6  CL 120* 117* 116*  CO2 19* 21* 21*  BUN 31* 26* 24*  CREATININE 0.86 0.94 1.40*  GLUCOSE 90 125* 134*    Recent Labs Lab 02/07/17 0834 02/08/17 0452 02/09/17 0436  HGB 9.2* 9.2* 9.9*  HCT 28.7* 27.6* 30.1*  WBC 13.1* 11.0 13.2*  PLT 273 272 315   No results found.  ASSESSMENT / PLAN: Left hip fracture s/p fall  Atrial fibrillation with RVR Hypokalemia Acute renal failure  Leukocytosis  P: Continue amiodarone  Off cardizem gtt Precedex gtt-titrate to off Neo-synephrine to keep map >65  Cardiology and Orthopedic consulted appreciate input  Continue heparin gtt  Trend CBC Monitor for s/sx of bleeding Transfuse hgb <7 Trend BMP  Replace electrolytes as indicated Monitor UOP  Prn morphine and norco for pain management  Continuous telemetry monitoring  GI and DVT prophylaxis   Wallis BambergKurian Santiago Gladavid Seanne Chirico, M.D.  [facility]  Medical Director [facility] Medical Director [facility] Card[facility]

## 2017-02-09 NOTE — Progress Notes (Signed)
Subjective: Not communacative.  Heartrate again elevated.  Not stable for surgery.    * Surgery Date in Future * Procedure(s) (LRB): INTRAMEDULLARY (IM) NAIL INTERTROCHANTRIC (Left)    Patient reports pain as  .  Objective: csm good left leg.  Not responsive to verbal at this time.    VITALS:   Vitals:   02/09/17 0700 02/09/17 0800  BP: 120/85 120/67  Pulse:  (!) 107  Resp:  15  Temp: 98.4 F (36.9 C)   SpO2: 100% 100%    Neurologically intact ABD soft Neurovascular intact Sensation intact distally Intact pulses distally Dorsiflexion/Plantar flexion intact  LABS  Recent Labs  02/07/17 0834 02/08/17 0452 02/09/17 0436  HGB 9.2* 9.2* 9.9*  HCT 28.7* 27.6* 30.1*  WBC 13.1* 11.0 13.2*  PLT 273 272 315     Recent Labs  02/07/17 0834 02/08/17 0452 02/09/17 0436  NA 146* 142 145  K 3.8 3.2* 3.6  BUN 31* 26* 24*  CREATININE 0.86 0.94 1.40*  GLUCOSE 90 125* 134*    No results for input(s): LABPT, INR in the last 72 hours.   Assessment/Plan: * Surgery Date in Future * Procedure(s) (LRB): INTRAMEDULLARY (IM) NAIL INTERTROCHANTRIC (Left)   Surgery put on hold to Wednesday.  Needs to be more stable.  May not ever be able to get her in shape for this. Palliative care consult would be appropriate

## 2017-02-10 DIAGNOSIS — F0390 Unspecified dementia without behavioral disturbance: Secondary | ICD-10-CM

## 2017-02-10 LAB — CBC
HEMATOCRIT: 31.6 % — AB (ref 35.0–47.0)
HEMOGLOBIN: 10.4 g/dL — AB (ref 12.0–16.0)
MCH: 30.7 pg (ref 26.0–34.0)
MCHC: 32.9 g/dL (ref 32.0–36.0)
MCV: 93 fL (ref 80.0–100.0)
Platelets: 323 10*3/uL (ref 150–440)
RBC: 3.4 MIL/uL — AB (ref 3.80–5.20)
RDW: 14.4 % (ref 11.5–14.5)
WBC: 15.1 10*3/uL — ABNORMAL HIGH (ref 3.6–11.0)

## 2017-02-10 LAB — BASIC METABOLIC PANEL
ANION GAP: 6 (ref 5–15)
BUN: 17 mg/dL (ref 6–20)
CHLORIDE: 111 mmol/L (ref 101–111)
CO2: 23 mmol/L (ref 22–32)
Calcium: 7.9 mg/dL — ABNORMAL LOW (ref 8.9–10.3)
Creatinine, Ser: 1.08 mg/dL — ABNORMAL HIGH (ref 0.44–1.00)
GFR, EST AFRICAN AMERICAN: 52 mL/min — AB (ref >60)
GFR, EST NON AFRICAN AMERICAN: 45 mL/min — AB (ref >60)
Glucose, Bld: 119 mg/dL — ABNORMAL HIGH (ref 65–99)
POTASSIUM: 3.2 mmol/L — AB (ref 3.5–5.1)
SODIUM: 140 mmol/L (ref 135–145)

## 2017-02-10 LAB — HEPARIN LEVEL (UNFRACTIONATED)
HEPARIN UNFRACTIONATED: 0.28 [IU]/mL — AB (ref 0.30–0.70)
Heparin Unfractionated: 0.37 IU/mL (ref 0.30–0.70)

## 2017-02-10 LAB — POTASSIUM: POTASSIUM: 4.1 mmol/L (ref 3.5–5.1)

## 2017-02-10 LAB — TYPE AND SCREEN
ABO/RH(D): A POS
Antibody Screen: NEGATIVE

## 2017-02-10 LAB — MAGNESIUM: MAGNESIUM: 2.2 mg/dL (ref 1.7–2.4)

## 2017-02-10 MED ORDER — HEPARIN BOLUS VIA INFUSION
1800.0000 [IU] | Freq: Once | INTRAVENOUS | Status: AC
  Administered 2017-02-10: 1800 [IU] via INTRAVENOUS
  Filled 2017-02-10: qty 1800

## 2017-02-10 MED ORDER — POTASSIUM CHLORIDE CRYS ER 20 MEQ PO TBCR: 60.0000 meq | EXTENDED_RELEASE_TABLET | Freq: Once | ORAL | Status: DC

## 2017-02-10 MED ORDER — AMIODARONE HCL IN DEXTROSE 360-4.14 MG/200ML-% IV SOLN: 30.0000 mg/h | INTRAVENOUS | Status: DC

## 2017-02-10 MED ORDER — DILTIAZEM HCL ER COATED BEADS 240 MG PO CP24
240.0000 mg | ORAL_CAPSULE | Freq: Every day | ORAL | Status: DC
  Administered 2017-02-10: 240 mg via ORAL
  Filled 2017-02-10 (×2): qty 1

## 2017-02-10 MED ORDER — POTASSIUM CHLORIDE 10 MEQ/100ML IV SOLN
10.0000 meq | INTRAVENOUS | Status: DC
  Administered 2017-02-10 (×4): 10 meq via INTRAVENOUS
  Filled 2017-02-10 (×4): qty 100

## 2017-02-10 MED ORDER — AMIODARONE HCL 200 MG PO TABS
400.0000 mg | ORAL_TABLET | Freq: Two times a day (BID) | ORAL | Status: DC
  Administered 2017-02-10: 400 mg via ORAL
  Filled 2017-02-10: qty 2

## 2017-02-10 MED ORDER — AMIODARONE HCL IN DEXTROSE 360-4.14 MG/200ML-% IV SOLN
60.0000 mg/h | INTRAVENOUS | Status: DC
  Administered 2017-02-10: 60 mg/h via INTRAVENOUS

## 2017-02-10 MED ORDER — ENSURE ENLIVE PO LIQD
237.0000 mL | Freq: Three times a day (TID) | ORAL | Status: DC
  Administered 2017-02-11 – 2017-02-13 (×6): 237 mL via ORAL

## 2017-02-10 NOTE — Progress Notes (Signed)
Pharmacy Electrolyte Monitoring  83 y/o F with CAF admitted with hip fracture requiring potassium replacement.   Sodium (mmol/L)  Date Value  02/10/2017 140   Potassium (mmol/L)  Date Value  02/10/2017 4.1   Magnesium (mg/dL)  Date Value  40/98/119110/23/2018 2.2   Phosphorus (mg/dL)  Date Value  47/82/956210/22/2018 2.7   Calcium (mg/dL)  Date Value  13/08/657810/23/2018 7.9 (L)   Albumin (g/dL)  Date Value  46/96/295210/22/2018 2.4 (L)   Goal: K > 4 Mg > 2   Plan: K = 4.1 after supplementation which is at goal. No additional supplementation needed at this time.  Will recheck electrolytes with AM labs tomorrow.  Cindi CarbonMary M Wanita Derenzo, PharmD, BCPS Clinical Pharmacist  02/10/17 9:34 PM

## 2017-02-10 NOTE — Progress Notes (Signed)
ANTICOAGULATION CONSULT NOTE - Follow Up Consult  Pharmacy Consult for Heparin  Indication: atrial fibrillation  Allergies  Allergen Reactions  . Lisinopril Swelling    Tongue and lips was swollen.  . Haldol [Haloperidol Lactate] Rash    Patient Measurements: Height: 5\' 2"  (157.5 cm) Weight: 134 lb 4.2 oz (60.9 kg) IBW/kg (Calculated) : 50.1 Heparin Dosing Weight: 60.1kg   Vital Signs: Temp: 97.7 F (36.5 C) (10/23 1600) BP: 87/52 (10/23 1600) Pulse Rate: 58 (10/23 1600)  Labs:  Recent Labs  02/08/17 0452  02/09/17 0201 02/09/17 0436 02/09/17 0821  02/09/17 2038 02/10/17 0408 02/10/17 0509 02/10/17 1521  HGB 9.2*  --   --  9.9*  --   --   --  10.4*  --   --   HCT 27.6*  --   --  30.1*  --   --   --  31.6*  --   --   PLT 272  --   --  315  --   --   --  323  --   --   HEPARINUNFRC 0.63  < >  --   --   --   < > 0.15*  --  0.37 0.28*  CREATININE 0.94  --   --  1.40*  --   --   --  1.08*  --   --   TROPONINI  --   --  <0.03  --  <0.03  --   --   --   --   --   < > = values in this interval not displayed.  Estimated Creatinine Clearance: 32.1 mL/min (A) (by C-G formula based on SCr of 1.08 mg/dL (H)).   Medications:  Prescriptions Prior to Admission  Medication Sig Dispense Refill Last Dose  . aspirin EC 81 MG tablet Take 81 mg by mouth daily.    02/05/2017 at 0800  . diltiazem (CARDIZEM LA) 240 MG 24 hr tablet Take 1 tablet (240 mg total) by mouth daily. (Patient taking differently: Take 240 mg by mouth 2 (two) times daily. ) 30 tablet 6 02/05/2017 at 0800  . Memantine HCl ER 21 MG CP24 Take 21 mg by mouth daily. 30 capsule 2 02/05/2017 at 0800  . Sennosides-Docusate Sodium (SENEXON-S PO) Take 2 tablets by mouth daily.   02/05/2017 at Unknown time  . Vitamin D, Ergocalciferol, (DRISDOL) 50000 UNITS CAPS capsule Take 50,000 Units by mouth every 7 (seven) days.    Past Week at Unknown time    Assessment: Pharmacy consulted for heparin drip management for an 83 year  old female who presents after a fall, found to be in Afib RVR. Pt w/ hx of afib, not on anticoagulation due to fall risk.   Goal of Therapy:  Heparin level 0.3-0.7 units/ml Monitor platelets by anticoagulation protocol: Yes   Plan:  Bolus heparin 1800 units and increase infusion to 1000 units/hr with next HL in 8 hours.   Luisa HartScott Ambrie Carte, PharmD Clinical Pharmacist  02/10/2017

## 2017-02-10 NOTE — Progress Notes (Signed)
[facility] [place] [facility]   PATIENT NAME: Rebecca Webb    MR#:  [id number]  DATE OF BIRTH:  12/25/1928  SUBJECTIVE:  Sleepy. Unable to provide any history or review of system.  Intermittent hypotension IV heparin gtt -Heart rate 50-55.  Patient is off IV amiodarone drip and IV Cardizem drip  REVIEW OF SYSTEMS:   Review of Systems  Unable to perform ROS: Dementia   DRUG ALLERGIES:   Allergies  Allergen Reactions  . Lisinopril Swelling    Tongue and lips was swollen.  . Haldol [Haloperidol Lactate] Rash    VITALS:  Blood pressure 98/74, pulse (!) 113, temperature 97.7 F (36.5 C), resp. rate 19, height 5\' 2"  (1.575 m), weight 60.9 kg (134 lb 4.2 oz), SpO2 96 %.  PHYSICAL EXAMINATION:   Physical Exam  GENERAL:  83 y.o.-year-old patient lying in the bed with no acute distress. thin EYES: Pupils equal, round, reactive to light and accommodation. No scleral icterus. Extraocular muscles intact.  HEENT: Head atraumatic, normocephalic. Oropharynx and nasopharynx clear.  NECK:  Supple, no jugular venous distention. No thyroid enlargement, no tenderness.  LUNGS: Normal breath sounds bilaterally, no wheezing, rales, rhonchi. No use of accessory muscles of respiration.  CARDIOVASCULAR: S1, S2 normal. No murmurs, rubs, or gallops. Tachycardia+ ABDOMEN: Soft, nontender, nondistended. Bowel sounds present. No organomegaly or mass.  EXTREMITIES: No cyanosis, clubbing or edema b/l.   Bucks traction left leg NEUROLOGIC: unable to assess PSYCHIATRIC:sleepy SKIN: No obvious rash, lesion, or ulcer.   LABORATORY PANEL:  CBC  Recent Labs Lab 02/10/17 0408  WBC 15.1*  HGB 10.4*  HCT 31.6*  PLT 323    Chemistries   Recent Labs Lab 02/09/17 0436 02/10/17 0408  NA 145 140  K 3.6 3.2*  CL 116* 111  CO2 21* 23  GLUCOSE 134* 119*  BUN 24* 17  CREATININE 1.40* 1.08*  CALCIUM 7.9* 7.9*  MG 1.6* 2.2  AST 23  --   ALT 20  --   ALKPHOS 65  --    BILITOT 0.7  --    Cardiac Enzymes  Recent Labs Lab 02/09/17 0821  TROPONINI <0.03   RADIOLOGY:  No results found. ASSESSMENT AND PLAN:  83 y.o. female with known essential hypertension mixed hyperlipidemia dementia chronic kidney disease stage III having recent fall with significant hip pain and probable fracture of for which the patient is unable to ambulate. The patient was seen in the emergency room for further treatment options and has noticed that the patient was having atrial fibrillation with rapid ventricular rate  #1 acute left hip fracture After mechanical fall at home 4 days ago -consulted orthopedics with Dr Hyacinth Meeker -surgery on hold due to cardiac issues (afib and hypotension) - pain control with IV Dilaudid 2 mg every 6 hours prn -Scheduled for surgery tomorrow morning  #2 atrial fibrillation with RVR: Patient has chronic atrial fibrillation, on  Cardizem   -Patient is now off IV amiodarone and Cardizem drip.  She is to be continued on oral amiodarone and Cardizem -on  IV heparin gtt    .#3 leukocytosis: Appears reactive  #4  acute kidney injury -creatinine 1.39--1.01--0.86-- 1.40 -Continue IV fluids.  #5 CODE STATUS full code, discussed the CODE STATUS with patient's granddaughter Rebecca Webb who wants full resuscitation.    She says she is the Legal guardian and has paper work given to be placed in pt's chart.  Case discussed with Care Management/Social Worker. Management plans discussed with  the family and they are in agreement.  CODE STATUS FULL per grand dter Rebecca StanleyLisa  DVT Prophylaxis: SCD, TEDS and heparin gtt  TOTAL TIME TAKING CARE OF THIS PATIENT: 25 minutes.  >50% time spent on counselling and coordination of care  Note: This dictation was prepared with Dragon dictation along with smaller phrase technology. Any transcriptional errors that result from this process are unintentional.  Breanah Faddis M.D on 02/10/2017 at 2:48 PM  Between 7am to 6pm -  Pager - [phone]  After 6pm go to [email or web address] - Social research officer, governmentpassword [email or web address] [facility]  [facility]  Office  [phone]  CC: Primary care physician; Rebecca Webb, Rebecca Webb, MDPatient ID: Rebecca ReddenBirtie B Webb, female   DOB: [birth year], 83 y.o.   MRN: [id number]

## 2017-02-10 NOTE — Progress Notes (Signed)
Nutrition Follow-up  DOCUMENTATION CODES:   Severe malnutrition in context of chronic illness  INTERVENTION:  Provide Ensure Enlive po TID, each supplement provides 350 kcal and 20 grams of protein.  If patient is having difficulty chewing, would recommend a mechanical soft diet (dysphagia 3) instead of the GI soft diet currently ordered (low in fiber and fat).  NUTRITION DIAGNOSIS:   Malnutrition related to chronic illness as evidenced by severe depletion of muscle mass, severe depletion of body fat.  Ongoing.  GOAL:   Patient will meet greater than or equal to 90% of their needs  Not met.  MONITOR:   I & O's, Labs, Diet advancement, Skin, Weight trends  REASON FOR ASSESSMENT:   Low Braden    ASSESSMENT:   Rebecca Webb is a 83 y.o. female who complains of  Left hip pain after a fall at home several days ago.  She lives with her daughter.  She has dementia.  She apparently fell late Sunday night  -Surgery was cancelled due to A-fib and amiodarone gtt. Now scheduled for tomorrow.  Met with patient at bedside. Patient is unable to any history. No family members present at bedside at time of assessment. Per discussion with RN patient ate well with family for breakfast. She did not eat lunch, but family will be back at dinner.  Medications reviewed and include: senna, D5-1/2NS with KCl 40 mEq/L at 50 ml/hr (60 grams dextrose, 204 kcal daily).  Labs reviewed: Potassium 3.2, Creatinine 1.03.  I/O: 1285 ml UOP yesterday (0.9 ml/kg/hr); no documentation on meal completion in chart  Weight trend: last weight was 60.9 kg on 10/21 (+6.5 kg from admission)  Diet Order:  DIET SOFT Room service appropriate? Yes; Fluid consistency: Thin Diet NPO time specified Except for: Sips with Meds  Skin:  Reviewed, no issues  Last BM:  02/05/2017  Height:   Ht Readings from Last 1 Encounters:  02/05/17 5' 2"  (1.575 m)    Weight:   Wt Readings from Last 1 Encounters:  02/08/17  134 lb 4.2 oz (60.9 kg)    Ideal Body Weight:  50 kg  BMI:  Body mass index is 24.56 kg/m.  Estimated Nutritional Needs:   Kcal:  9381-0175 calories (MSJ x1.3-1.5)  Protein:  93-105 grams (1.5-1.7g/kg)  Fluid:  >1.5L  EDUCATION NEEDS:   Education needs no appropriate at this time  Willey Blade, [place], [place], [place] Office: [phone] Pager: [phone] After Hours/Weekend Pager: [phone]

## 2017-02-10 NOTE — Progress Notes (Addendum)
Good day. More alert at times. Ate a good breakfast for granddaughter. Converted to SR about 1253. Amiodorone drip stopped. Talked a little today. Remains in traction but floor care now.

## 2017-02-10 NOTE — Progress Notes (Signed)
Pharmacy consulted for amiodarone interaction review: Only severe amiodarone interaction found was with ondansetron which is ordered as ondansetron 4mg  iv q6h prn nausea. A dose has not been used this admission. Most recent QTc was 477ms opn 10/22, will continue to monitor for drug interactions.    Current Facility-Administered Medications:  .  acetaminophen (TYLENOL) tablet 650 mg, 650 mg, Oral, Q6H PRN **OR** acetaminophen (TYLENOL) suppository 650 mg, 650 mg, Rectal, Q6H PRN, Katha HammingKonidena, Snehalatha, MD .  bisacodyl (DULCOLAX) EC tablet 5 mg, 5 mg, Oral, Daily PRN, Katha HammingKonidena, Snehalatha, MD .  chlorhexidine (PERIDEX) 0.12 % solution 15 mL, 15 mL, Mouth Rinse, BID, Tukov, Magadalene S, NP, 15 mL at 02/09/17 2142 .  dextrose 5 % and 0.45 % NaCl with KCl 40 mEq/L infusion, , Intravenous, Continuous, Merwyn KatosSimonds, David B, MD, Last Rate: 50 mL/hr at 02/10/17 0711 .  heparin ADULT infusion 100 units/mL (25000 units/22950mL sodium chloride 0.45%), 850 Units/hr, Intravenous, Continuous, Enedina FinnerPatel, Sona, MD, Last Rate: 8.5 mL/hr at 02/10/17 0400, 850 Units/hr at 02/10/17 0400 .  HYDROcodone-acetaminophen (NORCO/VICODIN) 5-325 MG per tablet 1 tablet, 1 tablet, Oral, Q4H PRN, Merwyn KatosSimonds, David B, MD .  Influenza vac split quadrivalent PF (FLUZONE HIGH-DOSE) injection 0.5 mL, 0.5 mL, Intramuscular, Tomorrow-1000, Konidena, Snehalatha, MD .  LORazepam (ATIVAN) injection 0.5 mg, 0.5 mg, Intravenous, Q6H PRN, Merwyn KatosSimonds, David B, MD, 0.5 mg at 02/08/17 2149 .  MEDLINE mouth rinse, 15 mL, Mouth Rinse, q12n4p, Tukov, Magadalene S, NP, 15 mL at 02/09/17 1615 .  memantine (NAMENDA XR) 24 hr capsule 21 mg, 21 mg, Oral, Daily, Katha HammingKonidena, Snehalatha, MD .  metoprolol tartrate (LOPRESSOR) injection 2.5-5 mg, 2.5-5 mg, Intravenous, Q3H PRN, Merwyn KatosSimonds, David B, MD, 2.5 mg at 02/10/17 0720 .  morphine 2 MG/ML injection 2 mg, 2 mg, Intravenous, Q3H PRN, Merwyn KatosSimonds, David B, MD, 2 mg at 02/10/17 0719 .  [DISCONTINUED] ondansetron (ZOFRAN) tablet 4  mg, 4 mg, Oral, Q6H PRN **OR** ondansetron (ZOFRAN) injection 4 mg, 4 mg, Intravenous, Q6H PRN, Katha HammingKonidena, Snehalatha, MD .  pneumococcal 23 valent vaccine (PNU-IMMUNE) injection 0.5 mL, 0.5 mL, Intramuscular, Tomorrow-1000, Konidena, Snehalatha, MD .  potassium chloride 10 mEq in 100 mL IVPB, 10 mEq, Intravenous, Q1 Hr x 4, Tukov, Magadalene S, NP, Last Rate: 100 mL/hr at 02/10/17 0637, 10 mEq at 02/10/17 0637 .  senna-docusate (Senokot-S) tablet 2 tablet, 2 tablet, Oral, QHS, Enedina FinnerPatel, Sona, MD, 2 tablet at 02/09/17 2142  acetaminophen **OR** acetaminophen, bisacodyl, HYDROcodone-acetaminophen, LORazepam, metoprolol tartrate, morphine injection, [DISCONTINUED] ondansetron **OR** ondansetron (ZOFRAN) IV   Luan PullingGarrett Trevonne Nyland, PharmD, MBA, Liz ClaiborneBCGP Clinical Pharmacist [facility]

## 2017-02-10 NOTE — Progress Notes (Signed)
ANTICOAGULATION CONSULT NOTE - Follow Up Consult  Pharmacy Consult for Heparin  Indication: atrial fibrillation  Allergies  Allergen Reactions  . Lisinopril Swelling    Tongue and lips was swollen.  . Haldol [Haloperidol Lactate] Rash    Patient Measurements: Height: 5\' 2"  (157.5 cm) Weight: 134 lb 4.2 oz (60.9 kg) IBW/kg (Calculated) : 50.1 Heparin Dosing Weight: 60.1kg   Vital Signs: Temp: 98.1 F (36.7 C) (10/23 0200) Temp Source: Axillary (10/23 0200) BP: 129/87 (10/23 0600) Pulse Rate: 112 (10/23 0600)  Labs:  Recent Labs  02/08/17 0452  02/09/17 0201 02/09/17 0436 02/09/17 0821 02/09/17 0952 02/09/17 2038 02/10/17 0408 02/10/17 0509  HGB 9.2*  --   --  9.9*  --   --   --  10.4*  --   HCT 27.6*  --   --  30.1*  --   --   --  31.6*  --   PLT 272  --   --  315  --   --   --  323  --   HEPARINUNFRC 0.63  < >  --   --   --  <0.10* 0.15*  --  0.37  CREATININE 0.94  --   --  1.40*  --   --   --  1.08*  --   TROPONINI  --   --  <0.03  --  <0.03  --   --   --   --   < > = values in this interval not displayed.  Estimated Creatinine Clearance: 32.1 mL/min (A) (by C-G formula based on SCr of 1.08 mg/dL (H)).   Medications:  Prescriptions Prior to Admission  Medication Sig Dispense Refill Last Dose  . aspirin EC 81 MG tablet Take 81 mg by mouth daily.    02/05/2017 at 0800  . diltiazem (CARDIZEM LA) 240 MG 24 hr tablet Take 1 tablet (240 mg total) by mouth daily. (Patient taking differently: Take 240 mg by mouth 2 (two) times daily. ) 30 tablet 6 02/05/2017 at 0800  . Memantine HCl ER 21 MG CP24 Take 21 mg by mouth daily. 30 capsule 2 02/05/2017 at 0800  . Sennosides-Docusate Sodium (SENEXON-S PO) Take 2 tablets by mouth daily.   02/05/2017 at Unknown time  . Vitamin D, Ergocalciferol, (DRISDOL) 50000 UNITS CAPS capsule Take 50,000 Units by mouth every 7 (seven) days.    Past Week at Unknown time    Assessment: Pharmacy consulted for heparin drip management for an  83 year old female who presents after a fall, found to be in Afib RVR. Pt w/ hx of afib, not on anticoagulation due to fall risk. Patient receiving heparin at 600 units/hr; decreased from 850 units/hr due to elevated anti-Xa level.   Goal of Therapy:  Heparin level 0.3-0.7 units/ml Monitor platelets by anticoagulation protocol: Yes   Plan:  Per RN, heparin has not been held since being resumed at 0205. Will bolus 1500 units x 1 and increase rate to 750 units/hr. Will recheck anti-Xa level at 2000.   10/22 @ 2030 HL 0.15 subtherapeutic. Will rebolus w/ heparin 1850 units IV x 1 and will increase rate to 850 units/hr and will recheck HL @ 0700.  10/23 @ 0500 HL 0.37 therapeutic. Will recheck next level @ 1500.  Pharmacy will continue to monitor and adjust per consult.   Thomasene Rippleavid Dennison Mcdaid, PharmD, BCPS Clinical Pharmacist 02/10/2017

## 2017-02-10 NOTE — Progress Notes (Signed)
Pharmacy Electrolyte Monitoring  83 y/o F with CAF admitted with hip fracture requiring potassium replacement.   Sodium (mmol/L)  Date Value  02/10/2017 140   Potassium (mmol/L)  Date Value  02/10/2017 3.2 (L)   Magnesium (mg/dL)  Date Value  11/91/478210/23/2018 2.2   Phosphorus (mg/dL)  Date Value  95/62/130810/22/2018 2.7   Calcium (mg/dL)  Date Value  65/78/469610/23/2018 7.9 (L)   Albumin (g/dL)  Date Value  29/52/841310/22/2018 2.4 (L)   Goal: K > 4 Mg > 2   Patient received 4 K runs this AM. Will recheck K this PM and replace as necessary.   Luisa HartScott Marrisa Kimber, PharmD Clinical Pharmacist

## 2017-02-10 NOTE — Progress Notes (Signed)
[facility] Encounter Note  Patient: Rebecca Webb / Admit Date: 02/05/2017 / Date of Encounter: 02/10/2017, 8:03 AM   Subjective: Patient is hemodynamically stable at this time with borderline blood pressure  . Patient had issues with higher heart rate and lower blood pressure  And therefore no surgery has occurred.   now unfortunately has had a recurrence of atrial fibrillation with rapid ventricular rate. There is been no evidence of myocardial infarction or congestive heart failure at this time despite increased heart rate and will need supportive meds for heart rate control Review of Systems:  cannot assess due to obtundation and dementia Objective: Telemetry: Atrial fibrillation with rapid ventricular rate Physical Exam: Blood pressure 129/87, pulse (!) 112, temperature 98.1 F (36.7 C), temperature source Axillary, resp. rate 19, height 5\' 2"  (1.575 m), weight 60.9 kg (134 lb 4.2 oz), SpO2 93 %. Body mass index is 24.56 kg/m. General: Well developed, well nourished, in no acute distress. Head: Normocephalic, atraumatic, sclera non-icteric, no xanthomas, nares are without discharge. Neck: No apparent masses Lungs: Normal respirations with no wheezes, no rhonchi, no rales , no crackles   Heart: irregular rate and rhythm, normal S1 S2, no murmur, no rub, no gallop, PMI is normal size and placement, carotid upstroke normal without bruit, jugular venous pressure normal Abdomen: Soft, non-tender, non-distended with normoactive bowel sounds. No hepatosplenomegaly. Abdominal aorta is normal size without bruit Extremities:trace edema, no clubbing, no cyanosis, no ulcers,  Peripheral: 2+ radial, 2+ femoral, 2+ dorsal pedal pulses Neuro: does notAlert and oriented. Moves all extremities spontaneously. Psych:  Does notResponds to questions appropriately with a normal affect.   Intake/Output Summary (Last 24 hours) at 02/10/17 0803 Last data filed at 02/10/17 0444  Gross  per 24 hour  Intake          1957.93 ml  Output             1285 ml  Net           672.93 ml    Inpatient Medications:  . chlorhexidine  15 mL Mouth Rinse BID  . Influenza vac split quadrivalent PF  0.5 mL Intramuscular Tomorrow-1000  . mouth rinse  15 mL Mouth Rinse q12n4p  . memantine  21 mg Oral Daily  . pneumococcal 23 valent vaccine  0.5 mL Intramuscular Tomorrow-1000  . senna-docusate  2 tablet Oral QHS   Infusions:  . dextrose 5 % and 0.45 % NaCl with KCl 40 mEq/L 50 mL/hr at 02/10/17 0711  . heparin 850 Units/hr (02/10/17 0400)  . potassium chloride 10 mEq (02/10/17 0637)    Labs:  Recent Labs  02/09/17 0436 02/10/17 0408  NA 145 140  K 3.6 3.2*  CL 116* 111  CO2 21* 23  GLUCOSE 134* 119*  BUN 24* 17  CREATININE 1.40* 1.08*  CALCIUM 7.9* 7.9*  MG 1.6* 2.2  PHOS 2.7  --     Recent Labs  02/09/17 0436  AST 23  ALT 20  ALKPHOS 65  BILITOT 0.7  PROT 5.6*  ALBUMIN 2.4*    Recent Labs  02/09/17 0436 02/10/17 0408  WBC 13.2* 15.1*  HGB 9.9* 10.4*  HCT 30.1* 31.6*  MCV 92.8 93.0  PLT 315 323    Recent Labs  02/09/17 0201 02/09/17 0821  TROPONINI <0.03 <0.03   Invalid input(s): POCBNP No results for input(s): HGBA1C in the last 72 hours.   Weights: Filed Weights   02/06/17 0542 02/07/17 0500 02/08/17 0200  Weight: 61  kg (134 lb 6.4 oz) 60.1 kg (132 lb 7.9 oz) 60.9 kg (134 lb 4.2 oz)     Radiology/Studies:  Dg Chest 1 View  Result Date: 02/05/2017 CLINICAL DATA:  Fall EXAM: CHEST 1 VIEW COMPARISON:  None. FINDINGS: Cardiomegaly without pulmonary edema. No focal airspace consolidation. There is retrocardiac opacity crossing the midline, suspected to be a hiatal hernia, new from prior studies. IMPRESSION: Cardiomegaly with retrocardiac opacities suspected to be a hiatal hernia. The retrocardiac opacity is new from prior studies. A lateral chest radiograph would be helpful for confirmation. Electronically Signed   By: Deatra RobinsonKevin  Herman M.D.   On:  02/05/2017 18:18   Dg Hip Unilat W Or Wo Pelvis 2-3 Views Left  Result Date: 02/05/2017 CLINICAL DATA:  Fall with left hip pain. EXAM: DG HIP (WITH OR WITHOUT PELVIS) 2-3V LEFT COMPARISON:  None. FINDINGS: AP pelvis shows rightward rotation. There is diffuse bony demineralization. AP and frog-leg lateral views of the right hip show a complex femoral neck fracture with varus angulation. Fracture line in the proximal component containing the femoral head is curved and appears to have some remodeling along the fracture line. IMPRESSION: 1. Left femoral neck fracture with varus angulation. Imaging features are not entirely consistent with acute fracture and this may be an old fracture with bony remodeling. Pathologic fracture would also be a consideration. 2. Probable heterotopic ossification in the adjacent soft tissues suggesting nonacute injury. Electronically Signed   By: Kennith CenterEric  Mansell M.D.   On: 02/05/2017 18:23     Assessment and Recommendation  83 y.o. female with the known chronic kidney disease essential hypertensionwith the atrial fibrillation with rapid ventricular rate after above orthopedic fracture. The patient has had reasonable control ofheart rate with no evidence of heart failure or myocardial infarction. And now is having some control with amiodarone 1. Continue amiodarone for possible spontaneous conversion to normal sinus rhythm and heart rate control and reinstate diltiazem IV drip up to 15 mg/h and oral diltaizem as well 2. Heparin for further risk reduction in stroke with atrial fibrillation and/or other deep venous thrombosis concerns and stop for orthopedic surgery due to concerns of bleeding risk 3. Proceed to orthopedic surgery as necessarywith control of above with patient being moderate risk at this time for cardiovascular complication although will need surgery for further rehabilitation 4. No further cardiac diagnostics necessary at this time  Signed, Arnoldo HookerBruce Kourtnie Sachs M.D.  FACC

## 2017-02-10 NOTE — Progress Notes (Signed)
   Name: Rebecca Webb MRN: [id number] DOB: [birth year]    ADMISSION DATE:  02/05/2017 CONSULTATION DATE: 02/06/2017  REFERRING MD : Dr. Allena KatzPatel   CHIEF COMPLAINT: Atrial Fibrillation with RVR   BRIEF PATIENT DESCRIPTION:  83 yo female admitted to telemetry unit 10/18 with left hip fracture following a fall at home and afibb with rvr transferred to the stepdown unit 10/19 due to worsening AFRVR requiring amiodarone and cardizem gtts  SIGNIFICANT EVENTS  10/18-Pt admitted to the medsurg unit  10/19-Pt transferred to stepdown unit     SUBJECTIVE:  No distress. Remains poorly oriented but much calmer.  Rate control adequate. Surgery again postponed due to amiodarone infusion  VITAL SIGNS: Temp:  [97.7 F (36.5 C)-98.1 F (36.7 C)] 97.7 F (36.5 C) (10/23 0800) Pulse Rate:  [111-131] 113 (10/23 1100) Resp:  [13-29] 19 (10/23 1100) BP: (90-129)/(66-110) 98/74 (10/23 1100) SpO2:  [93 %-100 %] 96 % (10/23 1100)   PHYSICAL EXAMINATION: General: Frail, NAD  Neuro: Calm, no focal deficits HEENT: NCAT, sclerae white Cardiovascular: IRIR, no M Lungs: No adventitious sounds Abdomen: soft, + BS Ext: no edema  Skin: intact no rashes or lesions   Recent Labs Lab 02/08/17 0452 02/09/17 0436 02/10/17 0408  NA 142 145 140  K 3.2* 3.6 3.2*  CL 117* 116* 111  CO2 21* 21* 23  BUN 26* 24* 17  CREATININE 0.94 1.40* 1.08*  GLUCOSE 125* 134* 119*    Recent Labs Lab 02/08/17 0452 02/09/17 0436 02/10/17 0408  HGB 9.2* 9.9* 10.4*  HCT 27.6* 30.1* 31.6*  WBC 11.0 13.2* 15.1*  PLT 272 315 323   No results found.  ASSESSMENT / PLAN: Left hip fracture s/p fall  AFRVR - improved control. Remains on amio Recurrent hypokalemia AKI, nonoliguric - creatinine improving  Advanced dementia P: Continue amiodarone - transitioned to by mouth if okay with cardiology Continue diltiazem Continue full dose heparin for atrial fibrillation Monitor BMET intermittently Monitor I/Os Correct  electrolytes as indicated  IVFs adjusted today Transfer to telemetry (2A) if okay with cardiology  Billy Fischeravid Annleigh Knueppel, MD PCCM service Mobile [phone] Pager [phone] 02/10/2017 2:09 PM

## 2017-02-11 ENCOUNTER — Inpatient Hospital Stay: Payer: Medicare Other | Admitting: Certified Registered Nurse Anesthetist

## 2017-02-11 ENCOUNTER — Encounter
Admission: EM | Disposition: A | Discharge status: Home / Self Care | Payer: Self-pay | Source: Home / Self Care | Attending: Internal Medicine

## 2017-02-11 ENCOUNTER — Inpatient Hospital Stay: Payer: Medicare Other

## 2017-02-11 ENCOUNTER — Encounter: Payer: Self-pay | Admitting: Anesthesiology

## 2017-02-11 HISTORY — PX: INTRAMEDULLARY (IM) NAIL INTERTROCHANTERIC: SHX5875

## 2017-02-11 LAB — BASIC METABOLIC PANEL
Anion gap: 6 (ref 5–15)
BUN: 18 mg/dL (ref 6–20)
CALCIUM: 7.8 mg/dL — AB (ref 8.9–10.3)
CO2: 22 mmol/L (ref 22–32)
CREATININE: 0.96 mg/dL (ref 0.44–1.00)
Chloride: 111 mmol/L (ref 101–111)
GFR calc non Af Amer: 52 mL/min — ABNORMAL LOW (ref >60)
Glucose, Bld: 109 mg/dL — ABNORMAL HIGH (ref 65–99)
Potassium: 4.2 mmol/L (ref 3.5–5.1)
SODIUM: 139 mmol/L (ref 135–145)

## 2017-02-11 LAB — CBC
HCT: 27.7 % — ABNORMAL LOW (ref 35.0–47.0)
Hemoglobin: 9 g/dL — ABNORMAL LOW (ref 12.0–16.0)
MCH: 30.2 pg (ref 26.0–34.0)
MCHC: 32.4 g/dL (ref 32.0–36.0)
MCV: 93.2 fL (ref 80.0–100.0)
PLATELETS: 264 10*3/uL (ref 150–440)
RBC: 2.97 MIL/uL — AB (ref 3.80–5.20)
RDW: 14.7 % — ABNORMAL HIGH (ref 11.5–14.5)
WBC: 12.8 10*3/uL — AB (ref 3.6–11.0)

## 2017-02-11 LAB — MAGNESIUM: MAGNESIUM: 1.9 mg/dL (ref 1.7–2.4)

## 2017-02-11 SURGERY — FIXATION, FRACTURE, INTERTROCHANTERIC, WITH INTRAMEDULLARY ROD
Anesthesia: General | Site: Hip | Laterality: Left | Wound class: Clean

## 2017-02-11 MED ORDER — BISACODYL 10 MG RE SUPP
10.0000 mg | Freq: Every day | RECTAL | Status: DC | PRN
  Administered 2017-02-14: 10 mg via RECTAL
  Filled 2017-02-11: qty 1

## 2017-02-11 MED ORDER — DEXTROSE 5 % IV SOLN
2.0000 g | Freq: Three times a day (TID) | INTRAVENOUS | Status: AC
  Administered 2017-02-11 – 2017-02-12 (×3): 2 g via INTRAVENOUS
  Filled 2017-02-11 (×3): qty 20

## 2017-02-11 MED ORDER — SODIUM CHLORIDE 0.9 % IV SOLN
INTRAVENOUS | Status: DC | PRN
  Administered 2017-02-11: 35 ug/min via INTRAVENOUS

## 2017-02-11 MED ORDER — AMIODARONE HCL 200 MG PO TABS: 200.0000 mg | ORAL_TABLET | Freq: Two times a day (BID) | ORAL | Status: DC

## 2017-02-11 MED ORDER — ONDANSETRON HCL 4 MG PO TABS: 4.0000 mg | ORAL_TABLET | Freq: Four times a day (QID) | ORAL | Status: DC | PRN

## 2017-02-11 MED ORDER — AMIODARONE HCL 200 MG PO TABS
200.0000 mg | ORAL_TABLET | Freq: Every day | ORAL | Status: DC
  Administered 2017-02-12 – 2017-02-14 (×3): 200 mg via ORAL
  Filled 2017-02-11 (×3): qty 1

## 2017-02-11 MED ORDER — FENTANYL CITRATE (PF) 100 MCG/2ML IJ SOLN
INTRAMUSCULAR | Status: DC | PRN
  Administered 2017-02-11 (×2): 25 ug via INTRAVENOUS

## 2017-02-11 MED ORDER — PROPOFOL 10 MG/ML IV BOLUS
INTRAVENOUS | Status: DC | PRN
  Administered 2017-02-11: 100 mg via INTRAVENOUS

## 2017-02-11 MED ORDER — MAGNESIUM HYDROXIDE 400 MG/5ML PO SUSP: 30.0000 mL | Freq: Every day | ORAL | Status: DC | PRN

## 2017-02-11 MED ORDER — SODIUM CHLORIDE 0.45 % IV SOLN
INTRAVENOUS | Status: DC
  Administered 2017-02-11 – 2017-02-14 (×3): via INTRAVENOUS

## 2017-02-11 MED ORDER — DILTIAZEM HCL ER COATED BEADS 120 MG PO CP24
120.0000 mg | ORAL_CAPSULE | Freq: Every day | ORAL | Status: DC
  Administered 2017-02-12: 120 mg via ORAL
  Filled 2017-02-11 (×2): qty 1

## 2017-02-11 MED ORDER — METOCLOPRAMIDE HCL 5 MG/ML IJ SOLN: 5.0000 mg | Freq: Three times a day (TID) | INTRAMUSCULAR | Status: DC | PRN

## 2017-02-11 MED ORDER — CLINDAMYCIN PHOSPHATE 600 MG/50ML IV SOLN
600.0000 mg | Freq: Three times a day (TID) | INTRAVENOUS | Status: AC
  Administered 2017-02-11 – 2017-02-12 (×3): 600 mg via INTRAVENOUS
  Filled 2017-02-11 (×3): qty 50

## 2017-02-11 MED ORDER — ENOXAPARIN SODIUM 40 MG/0.4ML ~~LOC~~ SOLN
40.0000 mg | SUBCUTANEOUS | Status: DC
  Administered 2017-02-11 – 2017-02-13 (×3): 40 mg via SUBCUTANEOUS
  Filled 2017-02-11 (×5): qty 0.4

## 2017-02-11 MED ORDER — ACETAMINOPHEN 650 MG RE SUPP: 650.0000 mg | Freq: Four times a day (QID) | RECTAL | Status: DC | PRN

## 2017-02-11 MED ORDER — ONDANSETRON HCL 4 MG/2ML IJ SOLN
INTRAMUSCULAR | Status: DC | PRN
  Administered 2017-02-11: 4 mg via INTRAVENOUS

## 2017-02-11 MED ORDER — PHENOL 1.4 % MT LIQD: 1.0000 | OROMUCOSAL | Status: DC | PRN

## 2017-02-11 MED ORDER — FERROUS SULFATE 325 (65 FE) MG PO TABS
325.0000 mg | ORAL_TABLET | Freq: Every day | ORAL | Status: DC
  Administered 2017-02-12 – 2017-02-14 (×3): 325 mg via ORAL
  Filled 2017-02-11 (×3): qty 1

## 2017-02-11 MED ORDER — ONDANSETRON HCL 4 MG/2ML IJ SOLN: 4.0000 mg | Freq: Four times a day (QID) | INTRAMUSCULAR | Status: DC | PRN

## 2017-02-11 MED ORDER — LIDOCAINE HCL (CARDIAC) 20 MG/ML IV SOLN
INTRAVENOUS | Status: DC | PRN
  Administered 2017-02-11: 40 mg via INTRAVENOUS

## 2017-02-11 MED ORDER — FENTANYL CITRATE (PF) 100 MCG/2ML IJ SOLN: 25.0000 ug | INTRAMUSCULAR | Status: DC | PRN

## 2017-02-11 MED ORDER — PHENYLEPHRINE HCL 10 MG/ML IJ SOLN
INTRAMUSCULAR | Status: DC | PRN
  Administered 2017-02-11 (×2): 100 ug via INTRAVENOUS

## 2017-02-11 MED ORDER — SODIUM CHLORIDE 0.9 % IV BOLUS (SEPSIS)
250.0000 mL | Freq: Once | INTRAVENOUS | Status: AC
  Administered 2017-02-11: 250 mL via INTRAVENOUS

## 2017-02-11 MED ORDER — LACTATED RINGERS IV SOLN
INTRAVENOUS | Status: DC | PRN
  Administered 2017-02-11: 09:00:00 via INTRAVENOUS

## 2017-02-11 MED ORDER — ONDANSETRON HCL 4 MG/2ML IJ SOLN: 4.0000 mg | Freq: Once | INTRAMUSCULAR | Status: DC | PRN

## 2017-02-11 MED ORDER — DEXTROSE 5 % IV SOLN
2.0000 g | INTRAVENOUS | Status: AC
  Administered 2017-02-11: 2 g via INTRAVENOUS
  Filled 2017-02-11: qty 20

## 2017-02-11 MED ORDER — CLINDAMYCIN PHOSPHATE 600 MG/50ML IV SOLN
600.0000 mg | INTRAVENOUS | Status: AC
  Administered 2017-02-11: 600 mg via INTRAVENOUS
  Filled 2017-02-11: qty 50

## 2017-02-11 MED ORDER — DEXAMETHASONE SODIUM PHOSPHATE 10 MG/ML IJ SOLN
INTRAMUSCULAR | Status: DC | PRN
  Administered 2017-02-11: 4 mg via INTRAVENOUS

## 2017-02-11 MED ORDER — PROPOFOL 500 MG/50ML IV EMUL
INTRAVENOUS | Status: AC
  Filled 2017-02-11: qty 50

## 2017-02-11 MED ORDER — ALUM & MAG HYDROXIDE-SIMETH 200-200-20 MG/5ML PO SUSP: 30.0000 mL | ORAL | Status: DC | PRN

## 2017-02-11 MED ORDER — ACETAMINOPHEN 10 MG/ML IV SOLN
INTRAVENOUS | Status: DC | PRN
  Administered 2017-02-11: 1000 mg via INTRAVENOUS

## 2017-02-11 MED ORDER — FENTANYL CITRATE (PF) 100 MCG/2ML IJ SOLN
INTRAMUSCULAR | Status: AC
  Filled 2017-02-11: qty 2

## 2017-02-11 MED ORDER — MENTHOL 3 MG MT LOZG: 1.0000 | LOZENGE | OROMUCOSAL | Status: DC | PRN

## 2017-02-11 MED ORDER — MORPHINE SULFATE (PF) 2 MG/ML IV SOLN: 1.0000 mg | INTRAVENOUS | Status: DC | PRN

## 2017-02-11 MED ORDER — BUPIVACAINE-EPINEPHRINE (PF) 0.25% -1:200000 IJ SOLN
INTRAMUSCULAR | Status: DC | PRN
  Administered 2017-02-11: 30 mL via PERINEURAL

## 2017-02-11 MED ORDER — EPHEDRINE SULFATE 50 MG/ML IJ SOLN
INTRAMUSCULAR | Status: DC | PRN
  Administered 2017-02-11 (×2): 10 mg via INTRAVENOUS

## 2017-02-11 MED ORDER — BUPIVACAINE-EPINEPHRINE (PF) 0.5% -1:200000 IJ SOLN
INTRAMUSCULAR | Status: AC
  Filled 2017-02-11: qty 30

## 2017-02-11 MED ORDER — ACETAMINOPHEN 325 MG PO TABS: 650.0000 mg | ORAL_TABLET | Freq: Four times a day (QID) | ORAL | Status: DC | PRN

## 2017-02-11 MED ORDER — ACETAMINOPHEN 500 MG PO TABS: 1000.0000 mg | ORAL_TABLET | Freq: Four times a day (QID) | ORAL | Status: DC | PRN

## 2017-02-11 MED ORDER — ACETAMINOPHEN 10 MG/ML IV SOLN
INTRAVENOUS | Status: AC
  Filled 2017-02-11: qty 100

## 2017-02-11 MED ORDER — FLEET ENEMA 7-19 GM/118ML RE ENEM: 1.0000 | ENEMA | Freq: Once | RECTAL | Status: DC | PRN

## 2017-02-11 MED ORDER — HYDROCODONE-ACETAMINOPHEN 5-325 MG PO TABS
1.0000 | ORAL_TABLET | Freq: Four times a day (QID) | ORAL | Status: DC | PRN
  Administered 2017-02-12 – 2017-02-14 (×5): 1 via ORAL
  Filled 2017-02-11 (×5): qty 1

## 2017-02-11 MED ORDER — ZOLPIDEM TARTRATE 5 MG PO TABS: 5.0000 mg | ORAL_TABLET | Freq: Every evening | ORAL | Status: DC | PRN

## 2017-02-11 MED ORDER — METOCLOPRAMIDE HCL 10 MG PO TABS: 5.0000 mg | ORAL_TABLET | Freq: Three times a day (TID) | ORAL | Status: DC | PRN

## 2017-02-11 MED ORDER — SODIUM CHLORIDE 0.9% FLUSH: 10.0000 mL | INTRAVENOUS | Status: DC | PRN

## 2017-02-11 SURGICAL SUPPLY — 38 items
BIT DRILL SPINE 4.0MMX260 (BIT) ×1
BIT DRILL SPINE 4.0X260 (BIT) ×2 IMPLANT
BLADE HELICAL 90 (Orthopedic Implant) ×2 IMPLANT
BLADE HELICAL 90MM (Orthopedic Implant) ×1 IMPLANT
BNDG COHESIVE 4X5 TAN STRL (GAUZE/BANDAGES/DRESSINGS) ×3 IMPLANT
CANISTER SUCT 1200ML W/VALVE (MISCELLANEOUS) ×3 IMPLANT
CHLORAPREP W/TINT 26ML (MISCELLANEOUS) ×6 IMPLANT
DRAPE C-ARMOR (DRAPES) IMPLANT
DRAPE INCISE 23X17 IOBAN STRL (DRAPES) ×2
DRAPE INCISE IOBAN 23X17 STRL (DRAPES) ×1 IMPLANT
DRSG AQUACEL AG ADV 3.5X10 (GAUZE/BANDAGES/DRESSINGS) ×3 IMPLANT
DRSG AQUACEL AG ADV 3.5X14 (GAUZE/BANDAGES/DRESSINGS) IMPLANT
ELECT REM PT RETURN 9FT ADLT (ELECTROSURGICAL) ×3
ELECTRODE REM PT RTRN 9FT ADLT (ELECTROSURGICAL) ×1 IMPLANT
GAUZE PETRO XEROFOAM 1X8 (MISCELLANEOUS) IMPLANT
GAUZE SPONGE 4X4 12PLY STRL (GAUZE/BANDAGES/DRESSINGS) ×3 IMPLANT
GLOVE INDICATOR 8.0 STRL GRN (GLOVE) ×3 IMPLANT
GLOVE SURG ORTHO 8.5 STRL (GLOVE) ×3 IMPLANT
GOWN STRL REUS W/ TWL LRG LVL3 (GOWN DISPOSABLE) ×1 IMPLANT
GOWN STRL REUS W/TWL LRG LVL3 (GOWN DISPOSABLE) ×2
GOWN STRL REUS W/TWL LRG LVL4 (GOWN DISPOSABLE) ×3 IMPLANT
GUIDEWIRE 3.2X400 (WIRE) ×6 IMPLANT
KIT RM TURNOVER STRD PROC AR (KITS) ×3 IMPLANT
MAT BLUE FLOOR 46X72 FLO (MISCELLANEOUS) ×3 IMPLANT
NAIL TROCH FX 11/130D 170-S (Nail) ×3 IMPLANT
NEEDLE SPNL 18GX3.5 QUINCKE PK (NEEDLE) ×3 IMPLANT
NEEDLE SPNL 20GX3.5 QUINCKE YW (NEEDLE) ×3 IMPLANT
NS IRRIG 500ML POUR BTL (IV SOLUTION) ×3 IMPLANT
PACK HIP COMPR (MISCELLANEOUS) ×3 IMPLANT
REAMER ROD DEEP FLUTE 2.5X950 (INSTRUMENTS) IMPLANT
SCREW LOCK TI 5.0X36 F/IM NAIL (Screw) ×3 IMPLANT
STAPLER SKIN PROX 35W (STAPLE) ×3 IMPLANT
SUCTION FRAZIER HANDLE 10FR (MISCELLANEOUS) ×2
SUCTION TUBE FRAZIER 10FR DISP (MISCELLANEOUS) ×1 IMPLANT
SUT VIC AB 0 CT1 36 (SUTURE) ×3 IMPLANT
SUT VIC AB 2-0 CT1 27 (SUTURE) ×2
SUT VIC AB 2-0 CT1 TAPERPNT 27 (SUTURE) ×1 IMPLANT
SYR 30ML LL (SYRINGE) ×3 IMPLANT

## 2017-02-11 NOTE — Progress Notes (Signed)
Notified Bincy NP of Urine Output for past two hours, see I/O flowsheet for amount. Received order for 250 ml bolus.

## 2017-02-11 NOTE — Anesthesia Post-op Follow-up Note (Signed)
Anesthesia QCDR form completed.        

## 2017-02-11 NOTE — Progress Notes (Signed)
Notified Bincy NP of urine output for past one hour of 15 mls. Blood pressure stable. Lungs sound diminished. Bladder scan showed 0 mls. NP advised to continue to monitor over next one to two hours to see if urine output will increase, then reassess. Fluids running at 50 mls hour.

## 2017-02-11 NOTE — NC FL2 (Signed)
[place] MEDICAID FL2 LEVEL OF CARE SCREENING TOOL     IDENTIFICATION  Patient Name: Rebecca Webb Birthdate: 1928-11-29 Sex: female Admission Date (Current Location): 02/05/2017  [facility] and [place] Number:  [address]  ([id number]) Facility and Address:  [facility], [address] [place], [place] [address]      Provider Number: [id number]  Attending Physician Name and Address:  Enedina Finner, MD  Relative Name and Phone Number:       Current Level of Care: Hospital Recommended Level of Care: Skilled Nursing Facility Prior Approval Number:    Date Approved/Denied:   PASRR Number:  ([id number] )  Discharge Plan: SNF    Current Diagnoses: Patient Active Problem List   Diagnosis Date Noted  . Closed left hip fracture, initial encounter (HCC) 02/05/2017  . Agitation 12/05/2015  . Chronic insomnia 12/05/2015  . Pure hypercholesterolemia 08/24/2015  . CKD (chronic kidney disease) stage 3, GFR 30-59 ml/min (HCC) 08/23/2015  . Depression, major, single episode, complete remission (HCC) 08/23/2015  . Prediabetes 08/23/2015  . Vitamin D deficiency 08/23/2015  . Urinary incontinence in female 08/23/2015  . Alzheimer's dementia 02/22/2015  . Benign essential hypertension 02/22/2015  . Gastroesophageal reflux disease 02/22/2015  . Hyperlipidemia, mixed 02/22/2015  . Shortness of breath 02/22/2015  . A-fib (HCC) 02/18/2015    Orientation RESPIRATION BLADDER Height & Weight     Self  Normal Continent Weight: 134 lb 4.2 oz (60.9 kg) Height:  5\' 2"  (157.5 cm)  BEHAVIORAL SYMPTOMS/MOOD NEUROLOGICAL BOWEL NUTRITION STATUS      Incontinent Diet (Diet: Clear Liquid to be advanced. )  AMBULATORY STATUS COMMUNICATION OF NEEDS Skin   Extensive Assist Verbally Surgical wounds (Incision: Left Hip. )                       Personal Care Assistance Level of Assistance  Bathing, Feeding, Dressing Bathing Assistance: Limited assistance Feeding  assistance: Independent Dressing Assistance: Limited assistance     Functional Limitations Info  Sight, Hearing, Speech Sight Info: Adequate Hearing Info: Adequate Speech Info: Adequate    SPECIAL CARE FACTORS FREQUENCY  PT (By licensed PT), OT (By licensed OT)     PT Frequency:  (5) OT Frequency:  (5)            Contractures      Additional Factors Info  Code Status, Allergies Code Status Info:  (Full Code. ) Allergies Info:  (Lisinopril, Haldol Haloperidol Lactate)           Current Medications (02/11/2017):  This is the current hospital active medication list Current Facility-Administered Medications  Medication Dose Route Frequency Provider Last Rate Last Dose  . 0.45 % sodium chloride infusion   Intravenous Continuous Deeann Saint, MD      . acetaminophen (TYLENOL) tablet 650 mg  650 mg Oral Q6H PRN Katha Hamming, MD       Or  . acetaminophen (TYLENOL) suppository 650 mg  650 mg Rectal Q6H PRN Katha Hamming, MD      . acetaminophen (TYLENOL) tablet 650 mg  650 mg Oral Q6H PRN Deeann Saint, MD       Or  . acetaminophen (TYLENOL) suppository 650 mg  650 mg Rectal Q6H PRN Deeann Saint, MD      . acetaminophen (TYLENOL) tablet 1,000 mg  1,000 mg Oral Q6H PRN Deeann Saint, MD      . alum & mag hydroxide-simeth (MAALOX/MYLANTA) 200-200-20 MG/5ML suspension 30 mL  30 mL Oral Q4H  PRN Deeann SaintMiller, Howard, MD      . amiodarone (PACERONE) tablet 200 mg  200 mg Oral Daily Enedina FinnerPatel, Sona, MD      . bisacodyl (DULCOLAX) EC tablet 5 mg  5 mg Oral Daily PRN Katha HammingKonidena, Snehalatha, MD      . bisacodyl (DULCOLAX) suppository 10 mg  10 mg Rectal Daily PRN Deeann SaintMiller, Howard, MD      . ceFAZolin (ANCEF) 2 g in dextrose 5 % 100 mL IVPB  2 g Intravenous Q8H Deeann SaintMiller, Howard, MD      . chlorhexidine (PERIDEX) 0.12 % solution 15 mL  15 mL Mouth Rinse BID Marylou Flesherukov, Magadalene S, NP   15 mL at 02/10/17 2132  . clindamycin (CLEOCIN) IVPB 600 mg  600 mg Intravenous Q8H Deeann SaintMiller, Howard, MD       . dextrose 5 % and 0.45 % NaCl with KCl 40 mEq/L infusion   Intravenous Continuous Merwyn KatosSimonds, David B, MD 50 mL/hr at 02/11/17 0800    . [START ON 02/12/2017] diltiazem (CARDIZEM CD) 24 hr capsule 120 mg  120 mg Oral Daily Enedina FinnerPatel, Sona, MD      . Melene Muller[START ON 02/12/2017] enoxaparin (LOVENOX) injection 40 mg  40 mg Subcutaneous Q24H Deeann SaintMiller, Howard, MD      . feeding supplement (ENSURE ENLIVE) (ENSURE ENLIVE) liquid 237 mL  237 mL Oral TID BM Enedina FinnerPatel, Sona, MD      . Melene Muller[START ON 02/12/2017] ferrous sulfate tablet 325 mg  325 mg Oral Q breakfast Deeann SaintMiller, Howard, MD      . HYDROcodone-acetaminophen (NORCO/VICODIN) 5-325 MG per tablet 1 tablet  1 tablet Oral Q4H PRN Merwyn KatosSimonds, David B, MD      . HYDROcodone-acetaminophen (NORCO/VICODIN) 5-325 MG per tablet 1-2 tablet  1-2 tablet Oral Q6H PRN Deeann SaintMiller, Howard, MD      . LORazepam (ATIVAN) injection 0.5 mg  0.5 mg Intravenous Q6H PRN Merwyn KatosSimonds, David B, MD   0.5 mg at 02/08/17 2149  . magnesium hydroxide (MILK OF MAGNESIA) suspension 30 mL  30 mL Oral Daily PRN Deeann SaintMiller, Howard, MD      . MEDLINE mouth rinse  15 mL Mouth Rinse q12n4p Tukov, Magadalene S, NP   15 mL at 02/10/17 1553  . memantine (NAMENDA XR) 24 hr capsule 21 mg  21 mg Oral Daily Katha HammingKonidena, Snehalatha, MD   21 mg at 02/10/17 1016  . menthol-cetylpyridinium (CEPACOL) lozenge 3 mg  1 lozenge Oral PRN Deeann SaintMiller, Howard, MD       Or  . phenol (CHLORASEPTIC) mouth spray 1 spray  1 spray Mouth/Throat PRN Deeann SaintMiller, Howard, MD      . metoCLOPramide (REGLAN) tablet 5-10 mg  5-10 mg Oral Q8H PRN Deeann SaintMiller, Howard, MD       Or  . metoCLOPramide (REGLAN) injection 5-10 mg  5-10 mg Intravenous Q8H PRN Deeann SaintMiller, Howard, MD      . metoprolol tartrate (LOPRESSOR) injection 2.5-5 mg  2.5-5 mg Intravenous Q3H PRN Merwyn KatosSimonds, David B, MD   2.5 mg at 02/10/17 0720  . morphine 2 MG/ML injection 1 mg  1 mg Intravenous Q2H PRN Deeann SaintMiller, Howard, MD      . morphine 2 MG/ML injection 2 mg  2 mg Intravenous Q3H PRN Merwyn KatosSimonds, David B, MD   2 mg  at 02/11/17 0538  . ondansetron (ZOFRAN) tablet 4 mg  4 mg Oral Q6H PRN Deeann SaintMiller, Howard, MD       Or  . ondansetron [facility](ZOFRAN) injection 4 mg  4 mg Intravenous Q6H PRN Deeann SaintMiller, Howard, MD      .  pneumococcal 23 valent vaccine (PNU-IMMUNE) injection 0.5 mL  0.5 mL Intramuscular Tomorrow-1000 Katha Hamming, MD      . senna-docusate (Senokot-S) tablet 2 tablet  2 tablet Oral QHS Enedina Finner, MD   2 tablet at 02/09/17 2142  . sodium phosphate (FLEET) 7-19 GM/118ML enema 1 enema  1 enema Rectal Once PRN Deeann Saint, MD      . zolpidem Remus Loffler) tablet 5 mg  5 mg Oral QHS PRN Deeann Saint, MD         Discharge Medications: Please see discharge summary for a list of discharge medications.  Relevant Imaging Results:  Relevant Lab Results:   Additional Information  (SSN: [id number])  Kamilla Hands, Darleen Crocker, LCSW

## 2017-02-11 NOTE — Clinical Social Work Note (Signed)
Clinical Social Work Assessment  Patient Details  Name: Rebecca Webb MRN: [id number] Date of Birth: 16-Aug-1928  Date of referral:  02/11/17               Reason for consult:  Facility Placement                Permission sought to share information with:    Permission granted to share information::     Name::        Agency::     Relationship::     Contact Information:     Housing/Transportation Living arrangements for the past 2 months:  Single Family Home Source of Information:  Guardian Patient Interpreter Needed:  None Criminal Activity/Legal Involvement Pertinent to Current Situation/Hospitalization:  No - Comment as needed Significant Relationships:  Other Family Members Lives with:  Other (Comment) (Grand-daughter ) Do you feel safe going back to the place where you live?    Need for family participation in patient care:  Yes (Comment)  Care giving concerns:  [place] lives in [place] with her grand-daughter/ guardian Janie MorningLisa McAdoo [phone].     Social Worker assessment / plan:  Visual merchandiserClinical Social Worker (CSW) received SNF consult. Patient had surgery today for a hip fracture. CSW attempted to meet with patient however she was confused and could not answer questions. CSW contacted patient's grand-daughter/ guardian [name]. Per [name] patient lives with her in [place] and she has guardianship over patient. Guardianship paper work has been scanned into [employer] under the media tab. Per [name] she is with patient 24/7. Per [name] patient was walking before the fall. CSW explained that PT will evaluate patient after surgery and make a recommendation of SNF or home health. CSW explained SNF process. [name] adamantly refused SNF and stated that she will take patient home. Per [name] patient has had bad experiences at SNFs. Per [name] she doesn't have preference for home health and is agreeable to [facility]. [name] requested a walker and bedside commode. Per [name] they have a 1 story home and  patient has bed rails. CSW contacted [place] DSS supervisor Kylie who confirmed that patient's grand-daughter is her legal guardian and [facility] no longer has guardianship. RN case manager aware of above. CSW will continue to follow and assist as needed.     Employment status:  Disabled (Comment on whether or not currently receiving Disability) Insurance information:  Managed Medicare PT Recommendations:  Not assessed at this time Information / Referral to community resources:  Skilled Nursing Facility  Patient/Family's Response to care:  Patient's guardian wants to take patient home with home health.   Patient/Family's Understanding of and Emotional Response to Diagnosis, Current Treatment, and Prognosis:  Patient's guardian was very pleasant and thanked CSW for calling.   Emotional Assessment Appearance:  Appears stated age Attitude/Demeanor/Rapport:  Unable to Assess Affect (typically observed):  Unable to Assess Orientation:  Oriented to Self, Fluctuating Orientation (Suspected and/or reported Sundowners) Alcohol / Substance use:  Not Applicable Psych involvement (Current and /or in the community):  No (Comment)  Discharge Needs  Concerns to be addressed:  Discharge Planning Concerns Readmission within the last 30 days:  No Current discharge risk:  Dependent with Mobility, Chronically ill, Cognitively Impaired Barriers to Discharge:  Continued Medical Work up   [employer], Darleen CrockerBailey M, LCSW 02/11/2017, 2:51 PM

## 2017-02-11 NOTE — Progress Notes (Signed)
Pharmacy Electrolyte Monitoring  83 y/o F with CAF admitted with hip fracture requiring potassium replacement.   Sodium (mmol/L)  Date Value  02/11/2017 139   Potassium (mmol/L)  Date Value  02/11/2017 4.2   Magnesium (mg/dL)  Date Value  40/98/119110/24/2018 1.9   Phosphorus (mg/dL)  Date Value  47/82/956210/22/2018 2.7   Calcium (mg/dL)  Date Value  13/08/657810/24/2018 7.8 (L)   Albumin (g/dL)  Date Value  46/96/295210/22/2018 2.4 (L)   Goal: K > 4 Mg > 2   Electrolytes are WNL. Will f/u AM labs.   Luisa HartScott Jaeger Trueheart, PharmD Clinical Pharmacist

## 2017-02-11 NOTE — Care Management Note (Signed)
Case Management Note  Patient Details  Name: Jerelene ReddenBirtie B Vespa MRN: [id number] Date of Birth: [birth year]  Subjective/Objective:  POD # 0 left hip pinning. Patient lives at home with her granddaughter, Rebecca Webb, who is also her legal guardian. Paperwork is on the paper chart. Patient has dementia but is able to walk at home.  CSW spoke with Rebecca Webb by phone and she would like her grandmother to come home. She is not interested in SNF.  She was offered choice of home health agencies and did not have one. RNCM made [facility] HHPT and [facility]. Ordered walker and BSC from [facility]. PCP is Calvert CantorJasine Singh, last seen 12/24/2016.              Action/Plan: [facility] for PT and HHA. DME: walker and BSC.   Expected Discharge Date:                  Expected Discharge Plan:  Home w Home Health Services  In-House Referral:     Discharge planning Services  CM Consult  Post Acute Care Choice:  Durable Medical Equipment, Home Health [facility]  DME Arranged:  Bedside commode, Walker rolling DME Agency:  [facility]  HH Arranged:  PT, Nurse's Aide HH Agency:  [facility]  Status of Service:  In process, will continue to follow  If discussed at Long Length of Stay Meetings, dates discussed:    Additional Comments:  Marily MemosLisa M Menelik Mcfarren, RN 02/11/2017, 2:42 PM

## 2017-02-11 NOTE — H&P (Signed)
THE PATIENT WAS SEEN PRIOR TO SURGERY TODAY.  HISTORY, ALLERGIES, HOME MEDICATIONS AND OPERATIVE PROCEDURE WERE REVIEWED. RISKS AND BENEFITS OF SURGERY DISCUSSED WITH PATIENT AGAIN.  NO CHANGES FROM INITIAL HISTORY AND PHYSICAL NOTED.    

## 2017-02-11 NOTE — Progress Notes (Signed)
Care resumed from previous RN. Dressing with moderate drainage at ABD pad. Charge nurse reinforced with pressure coban. Traction applied as ordered. Mitts in place for safety.

## 2017-02-11 NOTE — Transfer of Care (Signed)
Immediate Anesthesia Transfer of Care Note  Patient: Rebecca Webb  Procedure(s) Performed: INTRAMEDULLARY (IM) NAIL INTERTROCHANTRIC (Left Hip)  Patient Location: PACU  Anesthesia Type:General  Level of Consciousness: sedated  Airway & Oxygen Therapy: Patient Spontanous Breathing and Patient connected to face mask oxygen  Post-op Assessment: Report given to RN and Post -op Vital signs reviewed and stable  Post vital signs: Reviewed and stable  Last Vitals:  Vitals:   02/11/17 0700 02/11/17 0800  BP: 119/62 106/62  Pulse: (!) 52 (!) 59  Resp: 11 15  Temp:  36.9 C  SpO2: 92% 93%    Last Pain:  Vitals:   02/11/17 0800  TempSrc: Axillary  PainSc:          Complications: No apparent anesthesia complications

## 2017-02-11 NOTE — Anesthesia Preprocedure Evaluation (Signed)
Anesthesia Evaluation  Patient identified by MRN, date of birth, ID band Patient awake    Reviewed: Allergy & Precautions, H&P , NPO status , Patient's Chart, lab work & pertinent test results, reviewed documented beta blocker date and time   History of Anesthesia Complications Negative for: history of anesthetic complications  Airway       Comment: Patient uncooperative with exam Dental  (+) Poor Dentition, Missing, Dental Advidsory Given   Pulmonary neg pulmonary ROS,           Cardiovascular Exercise Tolerance: Good hypertension, (-) angina(-) CAD, (-) Past MI, (-) Cardiac Stents and (-) CABG negative cardio ROS  + dysrhythmias Atrial Fibrillation (-) Valvular Problems/Murmurs     Neuro/Psych PSYCHIATRIC DISORDERS (Dementia) negative neurological ROS     GI/Hepatic Neg liver ROS, GERD  ,  Endo/Other  negative endocrine ROS  Renal/GU CRFRenal disease  negative genitourinary   Musculoskeletal   Abdominal   Peds  Hematology negative hematology ROS (+)   Anesthesia Other Findings Past Medical History: No date: Atrial fibrillation (HCC) No date: Chronic kidney disease No date: Dementia No date: Dementia No date: Depression No date: GERD (gastroesophageal reflux disease) No date: Hyperlipemia No date: Hypertension No date: Vitamin D deficiency   Reproductive/Obstetrics negative OB ROS                             Anesthesia Physical Anesthesia Plan  ASA: III  Anesthesia Plan: General   Post-op Pain Management:    Induction: Intravenous  PONV Risk Score and Plan: 3 and Ondansetron, Dexamethasone and Midazolam  Airway Management Planned: LMA  Additional Equipment:   Intra-op Plan:   Post-operative Plan: Extubation in OR  Informed Consent: I have reviewed the patients History and Physical, chart, labs and discussed the procedure including the risks, benefits and  alternatives for the proposed anesthesia with the patient or authorized representative who has indicated his/her understanding and acceptance.   Dental Advisory Given  Plan Discussed with: Anesthesiologist, CRNA and Surgeon  Anesthesia Plan Comments:         Anesthesia Quick Evaluation

## 2017-02-11 NOTE — Progress Notes (Signed)
Spoke with Dr. Enedina FinnerSona Patel about patient's HR of 45. Stated as long as she was on cardiac monitoring it would be ok for her to go to 1A.

## 2017-02-11 NOTE — Op Note (Signed)
DATE OF SURGERY:  02/11/2017  TIME: 10:26 AM  PATIENT NAME:  Rebecca Webb  AGE: 83 y.o.  PRE-OPERATIVE DIAGNOSIS:  Left fractured hip left intertrochanteric/base neck  POST-OPERATIVE DIAGNOSIS:  SAME  PROCEDURE:  INTRAMEDULLARY (IM) NAIL INTERTROCHANTRIC left hip with trochanteric fixation nail  SURGEON:  Antonella Upson E  EBL: 20 cc  COMPLICATIONS: None  OPERATIVE IMPLANTS: Synthes trochanteric femoral nail 130 degree / 11 mm with interlocking helical blade 90 mm and distal locking screw 36 mm.  PREOPERATIVE INDICATIONS:  [name] is a 83 y.o. year old who fell and suffered a hip fracture. She was brought into the ER and then admitted and optimized and then elected for surgical intervention.    The risks benefits and alternatives were discussed with the patient including but not limited to the risks of nonoperative treatment, versus surgical intervention including infection, bleeding, nerve injury, malunion, nonunion, hardware prominence, hardware failure, need for hardware removal, blood clots, cardiopulmonary complications, morbidity, mortality, among others, and they were willing to proceed.    OPERATIVE PROCEDURE:  The patient was brought to the operating room and placed in the supine position.  General LMA anesthesia was administered, with a foley. She was placed on the fracture table.  Closed reduction was performed under C-arm guidance. The length of the femur was also measured using fluoroscopy. Time out was then performed after sterile prep and drape. She received preoperative antibiotics.  Incision was made proximal to the greater trochanter. A guidewire was placed in the appropriate position. Confirmation was made on AP and lateral views. The above-named nail was opened. I opened the proximal femur with a reamer. I then placed the nail by hand easily down. I did not need to ream the femur.  Once the nail was completely seated, I placed a guidepin into the femoral head  into the center center position through a second incision.  I measured the length, and then reamed the lateral cortex and up into the head. I then placed the helical blade. Slight compression was applied. Anatomic fixation achieved. Bone quality was mediocre.  I then secured the proximal interlock.  The distal locking screw was then placed and after confirming the position of the fracture fragments and hardware I then removed the instruments, and took final C-arm pictures AP and lateral the entire length of the leg. Anatomic reconstruction was achieved, and the wounds were irrigated copiously and closed with Vicryl  followed by staples and dry sterile dressing. Sponge and needle count were correct.   The patient was awakened and returned to PACU in stable and satisfactory condition. There no complications and the patient tolerated the procedure well.  She will be partial weightbearing as tolerated, and will be on Lovenox  For DVT prophylaxis.     Valinda HoarHoward E Lelend Heinecke, M.D.

## 2017-02-11 NOTE — Progress Notes (Signed)
Pt to surgery.

## 2017-02-11 NOTE — Anesthesia Procedure Notes (Signed)
Procedure Name: LMA Insertion Performed by: Shabree Tebbetts Pre-anesthesia Checklist: Patient identified, Patient being monitored, Timeout performed, Emergency Drugs available and Suction available Patient Re-evaluated:Patient Re-evaluated prior to induction Oxygen Delivery Method: Circle system utilized Preoxygenation: Pre-oxygenation with 100% oxygen Induction Type: IV induction Ventilation: Mask ventilation without difficulty LMA: LMA inserted LMA Size: 3.0 Tube type: Oral Number of attempts: 1 Placement Confirmation: positive ETCO2 and breath sounds checked- equal and bilateral Tube secured with: Tape Dental Injury: Teeth and Oropharynx as per pre-operative assessment        

## 2017-02-11 NOTE — Progress Notes (Signed)
Subjective: Not verbal.  HR and BP have stabilized.  Cleared for surgery by Dr Gwen PoundsKowalski.  Spoke to family again and emphasized risks vs benefits fo surgery and they wish to proceed.  They realize that she is very high risk.   Day of Surgery Procedure(s) (LRB): INTRAMEDULLARY (IM) NAIL INTERTROCHANTRIC (Left)    Patient reports pain as  .  Objective:   VITALS:   Vitals:   02/11/17 0639 02/11/17 0700  BP: (!) 108/52 119/62  Pulse: (!) 53 (!) 52  Resp: 11 11  Temp:    SpO2: (!) 89% 92%    Neurologically intact ABD soft Neurovascular intact Sensation intact distally Intact pulses distally Dorsiflexion/Plantar flexion intact  LABS  Recent Labs  02/09/17 0436 02/10/17 0408 02/11/17 0304  HGB 9.9* 10.4* 9.0*  HCT 30.1* 31.6* 27.7*  WBC 13.2* 15.1* 12.8*  PLT 315 323 264     Recent Labs  02/09/17 0436 02/10/17 0408 02/10/17 2009 02/11/17 0304  NA 145 140  --  139  K 3.6 3.2* 4.1 4.2  BUN 24* 17  --  18  CREATININE 1.40* 1.08*  --  0.96  GLUCOSE 134* 119*  --  109*    No results for input(s): LABPT, INR in the last 72 hours.   Assessment/Plan: Day of Surgery Procedure(s) (LRB): INTRAMEDULLARY (IM) NAIL INTERTROCHANTRIC (Left)   To surgery today for IM nail

## 2017-02-11 NOTE — Progress Notes (Addendum)
[facility] - [place] at [facility]   PATIENT NAME: Rebecca Webb    MR#:  [id number]  DATE OF BIRTH:  1928/08/02  SUBJECTIVE:  Seen in PACU. Pt is post op -Heart rate 46-52.  REVIEW OF SYSTEMS:   Review of Systems  Unable to perform ROS: Dementia   DRUG ALLERGIES:   Allergies  Allergen Reactions  . Lisinopril Swelling    Tongue and lips was swollen.  . Haldol [Haloperidol Lactate] Rash    VITALS:  Blood pressure 121/65, pulse (!) 45, temperature 97.9 F (36.6 C), resp. rate (!) 9, height 5\' 2"  (1.575 m), weight 60.9 kg (134 lb 4.2 oz), SpO2 94 %.  PHYSICAL EXAMINATION:   Physical Exam  GENERAL:  83 y.o.-year-old patient lying in the bed with no acute distress. thin EYES: Pupils equal, round, reactive to light and accommodation. No scleral icterus. Extraocular muscles intact.  HEENT: Head atraumatic, normocephalic. Oropharynx and nasopharynx clear.  NECK:  Supple, no jugular venous distention. No thyroid enlargement, no tenderness.  LUNGS: Normal breath sounds bilaterally, no wheezing, rales, rhonchi. No use of accessory muscles of respiration.  CARDIOVASCULAR: S1, S2 normal. No murmurs, rubs, or gallops. Tachycardia+ ABDOMEN: Soft, nontender, nondistended. Bowel sounds present. No organomegaly or mass.  EXTREMITIES: No cyanosis, clubbing or edema b/l.   Bucks traction left leg NEUROLOGIC: unable to assess PSYCHIATRIC:sleepy SKIN: No obvious rash, lesion, or ulcer.   LABORATORY PANEL:  CBC  Recent Labs Lab 02/11/17 0304  WBC 12.8*  HGB 9.0*  HCT 27.7*  PLT 264    Chemistries   Recent Labs Lab 02/09/17 0436  02/11/17 0304  NA 145  < > 139  K 3.6  < > 4.2  CL 116*  < > 111  CO2 21*  < > 22  GLUCOSE 134*  < > 109*  BUN 24*  < > 18  CREATININE 1.40*  < > 0.96  CALCIUM 7.9*  < > 7.8*  MG 1.6*  < > 1.9  AST 23  --   --   ALT 20  --   --   ALKPHOS 65  --   --   BILITOT 0.7  --   --   < > = values in this interval not  displayed. Cardiac Enzymes  Recent Labs Lab 02/09/17 0821  TROPONINI <0.03   RADIOLOGY:  Dg Hip Operative Unilat W Or W/o Pelvis Left  Result Date: 02/11/2017 CLINICAL DATA:  Left hip fracture. EXAM: OPERATIVE LEFT HIP (WITH PELVIS IF PERFORMED) 5 VIEWS TECHNIQUE: Fluoroscopic spot image(s) were submitted for interpretation post-operatively. COMPARISON:  Radiographs of 02/05/2017 FINDINGS: Multiple intraoperative images demonstrating placement of a a screw and nail fixation device within the proximal left femur. Improved alignment. No acute hardware complication. IMPRESSION: Intraoperative imaging of internal fixation of left femur. Electronically Signed   By: Jeronimo GreavesKyle  Webb M.D.   On: 02/11/2017 10:24   ASSESSMENT AND PLAN:  83 y.o. female with known essential hypertension mixed hyperlipidemia dementia chronic kidney disease stage III having recent fall with significant hip pain and probable fracture of for which the patient is unable to ambulate. The patient was seen in the emergency room for further treatment options and has noticed that the patient was having atrial fibrillation with rapid ventricular rate  #1 acute left hip fracture After mechanical fall at home 4 days ago -consulted orthopedics with Dr Rebecca MeekerMiller -pt is opt op  Left hip pinning by dr Rebecca Meekermiller - pain control with IV Dilaudid 2 mg  every 6 hours prn  #2 atrial fibrillation with RVR: - Patient has chronic atrial fibrillation  -Patient is now off IV amiodarone and Cardizem drip.   She is to be continued on oral amiodarone and Cardizem--HR in 46-54. Decreased amiodarone to 200 mg qd and Cardizem CD 120 mg qd--d/w Dr Rebecca Webb -d/c IV heparin gtt    .#3 leukocytosis: Appears reactive  #4  acute kidney injury -creatinine 1.39--1.01--0.86-- 1.40 -Continue IV fluids.  #5 CODE STATUS full code, discussed the CODE STATUS with patient's granddaughter Rebecca Webb who wants full resuscitation.  Transfer to ortho with tele  monitor  She says she is the Legal guardian and has paper work given to be placed in pt's chart.  Case discussed with Care Management/Social Worker.  CODE STATUS FULL per grand dter Rebecca Webb  DVT Prophylaxis:lovenox per dr Rebecca Webb TOTAL TIME TAKING CARE OF THIS PATIENT: 25 minutes.  >50% time spent on counselling and coordination of care  Note: This dictation was prepared with Dragon dictation along with smaller phrase technology. Any transcriptional errors that result from this process are unintentional.  Rebecca Webb M.D on 02/11/2017 at 11:46 AM  Between 7am to 6pm - Pager - [phone]  After 6pm go to [email or web address] - Social research officer, government  [facility] [place]  Office  [phone]  CC: Primary care physician; Rebecca Webb, MDPatient ID: Rebecca Webb, female   DOB: 1929/04/16, 83 y.o.   MRN: [id number]

## 2017-02-11 NOTE — Progress Notes (Signed)
Anticoagulation monitoring(Lovenox):  83yo  female ordered Lovenox 30 mg Q24h  Filed Weights   02/06/17 0542 02/07/17 0500 02/08/17 0200  Weight: 134 lb 6.4 oz (61 kg) 132 lb 7.9 oz (60.1 kg) 134 lb 4.2 oz (60.9 kg)   BMI 24.7   Lab Results  Component Value Date   CREATININE 0.96 02/11/2017   CREATININE 1.08 (H) 02/10/2017   CREATININE 1.40 (H) 02/09/2017   Estimated Creatinine Clearance: 36.1 mL/min (by C-G formula based on SCr of 0.96 mg/dL). Hemoglobin & Hematocrit     Component Value Date/Time   HGB 9.0 (L) 02/11/2017 0304   HCT 27.7 (L) 02/11/2017 0304     Per Protocol for Patient with estCrcl > 30 ml/min and BMI < 40, will transition to Lovenox 40 mg Q24h.

## 2017-02-11 NOTE — Progress Notes (Signed)
[facility]  Patient: Rebecca Webb / Admit Date: 02/05/2017 / Date of Encounter: 02/11/2017, 8:34 AM   Subjective: Patient is hemodynamically stable at this time with better blood pressure  . Patient had issues with higher heart rate and lower blood pressure  And therefore no surgery has occurred.and has had a recurrence of atrial fibrillation with rapid ventricular rate yesterday which now is converted back to nsr after appropriate meds. There is been no evidence of myocardial infarction or congestive heart failure at this time despite increased heart rate and stress Echo with normal lv function and no significant valve disease Review of Systems:  cannot assess due to obtundation and dementia Objective: Telemetry: Atrial fibrillation with rapid ventricular rate Physical Exam: Blood pressure 119/62, pulse (!) 52, temperature 97.7 F (36.5 C), temperature source Axillary, resp. rate 11, height 5\' 2"  (1.575 m), weight 60.9 kg (134 lb 4.2 oz), SpO2 92 %. Body mass index is 24.56 kg/m. General: Well developed, well nourished, in no acute distress. Head: Normocephalic, atraumatic, sclera non-icteric, no xanthomas, nares are without discharge. Neck: No apparent masses Lungs: Normal respirations with no wheezes, no rhonchi, no rales , no crackles   Heart: regular rate and rhythm, normal S1 S2, no murmur, no rub, no gallop, PMI is normal size and placement, carotid upstroke normal without bruit, jugular venous pressure normal Abdomen: Soft, non-tender, non-distended with normoactive bowel sounds. No hepatosplenomegaly. Abdominal aorta is normal size without bruit Extremities:trace edema, no clubbing, no cyanosis, no ulcers,  Peripheral: 2+ radial, 2+ femoral, 2+ dorsal pedal pulses Neuro: does notAlert and oriented. Moves all extremities spontaneously. Psych:  Does notResponds to questions appropriately with a normal affect.   Intake/Output Summary (Last 24  hours) at 02/11/17 0834 Last data filed at 02/11/17 0500  Gross per 24 hour  Intake          1645.21 ml  Output             1210 ml  Net           435.21 ml    Inpatient Medications:  . amiodarone  400 mg Oral BID  . chlorhexidine  15 mL Mouth Rinse BID  . diltiazem  240 mg Oral Daily  . feeding supplement (ENSURE ENLIVE)  237 mL Oral TID BM  . mouth rinse  15 mL Mouth Rinse q12n4p  . memantine  21 mg Oral Daily  . pneumococcal 23 valent vaccine  0.5 mL Intramuscular Tomorrow-1000  . senna-docusate  2 tablet Oral QHS   Infusions:  . dextrose 5 % and 0.45 % NaCl with KCl 40 mEq/L 50 mL/hr at 02/11/17 0238  . heparin Stopped (02/11/17 0000)    Labs:  Recent Labs  02/09/17 0436 02/10/17 0408 02/10/17 2009 02/11/17 0304  NA 145 140  --  139  K 3.6 3.2* 4.1 4.2  CL 116* 111  --  111  CO2 21* 23  --  22  GLUCOSE 134* 119*  --  109*  BUN 24* 17  --  18  CREATININE 1.40* 1.08*  --  0.96  CALCIUM 7.9* 7.9*  --  7.8*  MG 1.6* 2.2  --  1.9  PHOS 2.7  --   --   --     Recent Labs  02/09/17 0436  AST 23  ALT 20  ALKPHOS 65  BILITOT 0.7  PROT 5.6*  ALBUMIN 2.4*    Recent Labs  02/10/17 0408 02/11/17 0304  WBC 15.1* 12.8*  HGB 10.4* 9.0*  HCT 31.6* 27.7*  MCV 93.0 93.2  PLT 323 264    Recent Labs  02/09/17 0201 02/09/17 0821  TROPONINI <0.03 <0.03   Invalid input(s): POCBNP No results for input(s): HGBA1C in the last 72 hours.   Weights: Filed Weights   02/06/17 0542 02/07/17 0500 02/08/17 0200  Weight: 61 kg (134 lb 6.4 oz) 60.1 kg (132 lb 7.9 oz) 60.9 kg (134 lb 4.2 oz)     Radiology/Studies:  Dg Chest 1 View  Result Date: 02/05/2017 CLINICAL DATA:  Fall EXAM: CHEST 1 VIEW COMPARISON:  None. FINDINGS: Cardiomegaly without pulmonary edema. No focal airspace consolidation. There is retrocardiac opacity crossing the midline, suspected to be a hiatal hernia, new from prior studies. IMPRESSION: Cardiomegaly with retrocardiac opacities suspected to be  a hiatal hernia. The retrocardiac opacity is new from prior studies. A lateral chest radiograph would be helpful for confirmation. Electronically Signed   By: Deatra Robinson M.D.   On: 02/05/2017 18:18   Dg Hip Unilat W Or Wo Pelvis 2-3 Views Left  Result Date: 02/05/2017 CLINICAL DATA:  Fall with left hip pain. EXAM: DG HIP (WITH OR WITHOUT PELVIS) 2-3V LEFT COMPARISON:  None. FINDINGS: AP pelvis shows rightward rotation. There is diffuse bony demineralization. AP and frog-leg lateral views of the right hip show a complex femoral neck fracture with varus angulation. Fracture line in the proximal component containing the femoral head is curved and appears to have some remodeling along the fracture line. IMPRESSION: 1. Left femoral neck fracture with varus angulation. Imaging features are not entirely consistent with acute fracture and this may be an old fracture with bony remodeling. Pathologic fracture would also be a consideration. 2. Probable heterotopic ossification in the adjacent soft tissues suggesting nonacute injury. Electronically Signed   By: Kennith Center M.D.   On: 02/05/2017 18:23     Assessment and Recommendation  83 y.o. female with the known chronic kidney disease essential hypertensionwith the atrial fibrillation with rapid ventricular rate after above orthopedic fracture. The patient has had reasonable control ofheart rate with no evidence of heart failure or myocardial infarction. And now is in nsr on appropriate meds 1. Continue amiodarone orally to mainitain normal sinus rhythm and heart rate control and continue oral diltaizem as well 2. Heparin for further risk reduction in stroke with atrial fibrillation and/or other deep venous thrombosis concerns and stop for orthopedic surgery due to concerns of bleeding risk 3. Proceed to orthopedic surgery as necessary with control of above with patient being moderate risk at this time for cardiovascular complication although will need  surgery for further rehabilitation 4. No further cardiac diagnostics necessary at this time  Signed, Arnoldo Hooker M.D. FACC

## 2017-02-12 LAB — GLUCOSE, CAPILLARY
GLUCOSE-CAPILLARY: 138 mg/dL — AB (ref 65–99)
Glucose-Capillary: 98 mg/dL (ref 65–99)

## 2017-02-12 LAB — BASIC METABOLIC PANEL
ANION GAP: 6 (ref 5–15)
BUN: 17 mg/dL (ref 6–20)
CALCIUM: 7.5 mg/dL — AB (ref 8.9–10.3)
CO2: 22 mmol/L (ref 22–32)
Chloride: 107 mmol/L (ref 101–111)
Creatinine, Ser: 0.84 mg/dL (ref 0.44–1.00)
GLUCOSE: 102 mg/dL — AB (ref 65–99)
Potassium: 4 mmol/L (ref 3.5–5.1)
Sodium: 135 mmol/L (ref 135–145)

## 2017-02-12 LAB — CBC
HCT: 26 % — ABNORMAL LOW (ref 35.0–47.0)
Hemoglobin: 8.6 g/dL — ABNORMAL LOW (ref 12.0–16.0)
MCH: 30.6 pg (ref 26.0–34.0)
MCHC: 33.2 g/dL (ref 32.0–36.0)
MCV: 92.3 fL (ref 80.0–100.0)
PLATELETS: 260 10*3/uL (ref 150–440)
RBC: 2.82 MIL/uL — ABNORMAL LOW (ref 3.80–5.20)
RDW: 14.9 % — AB (ref 11.5–14.5)
WBC: 19.1 10*3/uL — AB (ref 3.6–11.0)

## 2017-02-12 LAB — MAGNESIUM: Magnesium: 1.6 mg/dL — ABNORMAL LOW (ref 1.7–2.4)

## 2017-02-12 MED ORDER — MAGNESIUM SULFATE 4 GM/100ML IV SOLN
4.0000 g | Freq: Once | INTRAVENOUS | Status: AC
  Administered 2017-02-12: 4 g via INTRAVENOUS
  Filled 2017-02-12: qty 100

## 2017-02-12 NOTE — Progress Notes (Signed)
[facility] - [place] at [facility]   PATIENT NAME: Rebecca Webb    MR#:  [id number]  DATE OF BIRTH:  Nov 08, 1928  SUBJECTIVE:  More awake and restless this am -Heart rate 46-52.  REVIEW OF SYSTEMS:   Review of Systems  Unable to perform ROS: Dementia   DRUG ALLERGIES:   Allergies  Allergen Reactions  . Lisinopril Swelling    Tongue and lips was swollen.  . Haldol [Haloperidol Lactate] Rash    VITALS:  Blood pressure (!) 141/97, pulse 74, temperature (!) 97.5 F (36.4 C), temperature source Axillary, resp. rate 17, height 5\' 2"  (1.575 m), weight 60.9 kg (134 lb 4.2 oz), SpO2 91 %.  PHYSICAL EXAMINATION:   Physical Exam  GENERAL:  83 y.o.-year-old patient lying in the bed with no acute distress. Thin, restless EYES: Pupils equal, round, reactive to light and accommodation. No scleral icterus. Extraocular muscles intact.  HEENT: Head atraumatic, normocephalic. Oropharynx and nasopharynx clear.  NECK:  Supple, no jugular venous distention. No thyroid enlargement, no tenderness.  LUNGS: Normal breath sounds bilaterally, no wheezing, rales, rhonchi. No use of accessory muscles of respiration.  CARDIOVASCULAR: S1, S2 normal. No murmurs, rubs, or gallops.  ABDOMEN: Soft, nontender, nondistended. Bowel sounds present. No organomegaly or mass.  EXTREMITIES: No cyanosis, clubbing or edema b/l.   Bucks traction left leg NEUROLOGIC: unable to assess PSYCHIATRIC restless, severe dementia SKIN: No obvious rash, lesion, or ulcer.   LABORATORY PANEL:  CBC  Recent Labs Lab 02/12/17 0503  WBC 19.1*  HGB 8.6*  HCT 26.0*  PLT 260    Chemistries   Recent Labs Lab 02/09/17 0436  02/12/17 0503  NA 145  < > 135  K 3.6  < > 4.0  CL 116*  < > 107  CO2 21*  < > 22  GLUCOSE 134*  < > 102*  BUN 24*  < > 17  CREATININE 1.40*  < > 0.84  CALCIUM 7.9*  < > 7.5*  MG 1.6*  < > 1.6*  AST 23  --   --   ALT 20  --   --   ALKPHOS 65  --   --   BILITOT 0.7  --    --   < > = values in this interval not displayed. Cardiac Enzymes  Recent Labs Lab 02/09/17 0821  TROPONINI <0.03   RADIOLOGY:  Dg Hip Operative Unilat W Or W/o Pelvis Left  Result Date: 02/11/2017 CLINICAL DATA:  Left hip fracture. EXAM: OPERATIVE LEFT HIP (WITH PELVIS IF PERFORMED) 5 VIEWS TECHNIQUE: Fluoroscopic spot image(s) were submitted for interpretation post-operatively. COMPARISON:  Radiographs of 02/05/2017 FINDINGS: Multiple intraoperative images demonstrating placement of a a screw and nail fixation device within the proximal left femur. Improved alignment. No acute hardware complication. IMPRESSION: Intraoperative imaging of internal fixation of left femur. Electronically Signed   By: Jeronimo Greaves M.D.   On: 02/11/2017 10:24   ASSESSMENT AND PLAN:  83 y.o. female with known essential hypertension mixed hyperlipidemia dementia chronic kidney disease stage III having recent fall with significant hip pain and probable fracture of for which the patient is unable to ambulate. The patient was seen in the emergency room for further treatment options and has noticed that the patient was having atrial fibrillation with rapid ventricular rate  #1 acute left hip fracture After mechanical fall at home 4 days ago -consulted orthopedics with Dr Hyacinth Meeker -pt is POD#1  Left hip pinning by dr Hyacinth Meeker - pain control  with IV morphine 1 mg every 6 hours prn, oral norco as needed  #2 atrial fibrillation with RVR: - Patient has chronic atrial fibrillation  -Patient is now off IV amiodarone and Cardizem drip.   She is to be continued on oral amiodarone and Cardizem--HR in 46-54. - cont  amiodarone to 200 mg qd and Cardizem CD 120 mg qd--d/w Dr Gwen PoundsKowalski -d/c IV heparin gtt    .#3 leukocytosis: Appears reactive  #4  acute kidney injury -creatinine 1.39--1.01--0.86-- 1.40--0.84 -Continue IV fluids. -Good uop  #5 CODE STATUS full code, discussed the CODE STATUS with patient's granddaughter  Rebecca Webb, legal guardian  CSW for d/c planning  She says she is the Legal guardian and has paper work given to be placed in pt's chart.  Case discussed with Care Management/Social Worker.  CODE STATUS FULL per grand dter Rebecca Webb  DVT Prophylaxis:lovenox per dr Hyacinth Meekermiller TOTAL TIME TAKING CARE OF THIS PATIENT: 25 minutes.  >50% time spent on counselling and coordination of care  Note: This dictation was prepared with Dragon dictation along with smaller phrase technology. Any transcriptional errors that result from this process are unintentional.  Fusae Florio M.D on 02/12/2017 at 7:52 AM  Between 7am to 6pm - Pager - [phone]  After 6pm go to [email or web address] - Social research officer, governmentpassword EPAS [facility]  [facility] [place] [facility]  Office  [phone]  CC: Primary care physician; Leotis ShamesSingh, Jasmine, MDPatient ID: Rebecca Webb, female   DOB: February 18, 1929, 83 y.o.   MRN: [id number]

## 2017-02-12 NOTE — Progress Notes (Signed)
Pharmacy Electrolyte Monitoring  83 y/o F with CAF admitted with hip fracture requiring potassium replacement.   Sodium (mmol/L)  Date Value  02/12/2017 135   Potassium (mmol/L)  Date Value  02/12/2017 4.0   Magnesium (mg/dL)  Date Value  16/10/960410/25/2018 1.6 (L)   Phosphorus (mg/dL)  Date Value  54/09/811910/22/2018 2.7   Calcium (mg/dL)  Date Value  14/78/295610/25/2018 7.5 (L)   Albumin (g/dL)  Date Value  21/30/865710/22/2018 2.4 (L)   Goal: K > 4 Mg > 2   Magnesium sulfate 4 g iv once and will f/u AM labs.   Luisa HartScott Hawkins Seaman, PharmD Clinical Pharmacist

## 2017-02-12 NOTE — Anesthesia Postprocedure Evaluation (Signed)
Anesthesia Post Note  Patient: Carah B Pacey  Procedure(s) Performed: INTRAMEDULLARY (IM) NAIL INTERTROCHANTRIC (Left Hip)  Patient location during evaluation: PACU Anesthesia Type: General Level of consciousness: awake and alert Pain management: pain level controlled Vital Signs Assessment: post-procedure vital signs reviewed and stable Respiratory status: spontaneous breathing, nonlabored ventilation, respiratory function stable and patient connected to nasal cannula oxygen Cardiovascular status: blood pressure returned to baseline and stable Postop Assessment: no apparent nausea or vomiting Anesthetic complications: no     Last Vitals:  Vitals:   02/11/17 2359 02/12/17 0406  BP: 102/74 (!) 141/97  Pulse: 74 74  Resp: 17 17  Temp: (!) 36.2 C (!) 36.4 C  SpO2: 92% 91%    Last Pain:  Vitals:   02/12/17 0406  TempSrc: Axillary  PainSc:                  Lenard SimmerAndrew Chin Wachter

## 2017-02-12 NOTE — Progress Notes (Signed)
PT Cancellation Note  Patient Details Name: Rebecca ReddenBirtie B Webb MRN: [id number] DOB: 04/12/1929   Cancelled Treatment:    Reason Eval/Treat Not Completed: Other (comment) Spoke with nursing who reports that pt has been agitated and "fighting."  Will hold afternoon session and try to see her again tomorrow.  QD status seems appropriate at this time unless cognitive status changes.   Malachi ProGalen R Ziggy Chanthavong, DPT 02/12/2017, 5:43 PM

## 2017-02-12 NOTE — Progress Notes (Signed)
Subjective: Not much pain. Eating supper.  Alert.  Did well today.  HR remains stable. Did not get OOB today  1 Day Post-Op Procedure(s) (LRB): INTRAMEDULLARY (IM) NAIL INTERTROCHANTRIC (Left)    Patient reports pain as mild.  Objective: Sitting up in bed.  Some drainage on dressing.  csm good left leg.  VITALS:   Vitals:   02/12/17 0406 02/12/17 0817  BP: (!) 141/97 (!) 148/78  Pulse: 74 88  Resp: 17 20  Temp: (!) 97.5 F (36.4 C) 98.2 F (36.8 C)  SpO2: 91% 98%    Neurologically intact ABD soft Neurovascular intact Sensation intact distally Intact pulses distally Dorsiflexion/Plantar flexion intact Incision: moderate drainage  LABS  Recent Labs  02/10/17 0408 02/11/17 0304 02/12/17 0503  HGB 10.4* 9.0* 8.6*  HCT 31.6* 27.7* 26.0*  WBC 15.1* 12.8* 19.1*  PLT 323 264 260     Recent Labs  02/10/17 0408 02/10/17 2009 02/11/17 0304 02/12/17 0503  NA 140  --  139 135  K 3.2* 4.1 4.2 4.0  BUN 17  --  18 17  CREATININE 1.08*  --  0.96 0.84  GLUCOSE 119*  --  109* 102*    No results for input(s): LABPT, INR in the last 72 hours.   Assessment/Plan: 1 Day Post-Op Procedure(s) (LRB): INTRAMEDULLARY (IM) NAIL INTERTROCHANTRIC (Left)   Advance diet Up with therapy D/C IV fluids Discharge to SNF

## 2017-02-12 NOTE — Evaluation (Signed)
Occupational Therapy Evaluation Patient Details Name: Rebecca Webb MRN: [id number] DOB: [birth year] Today's Date: 02/12/2017    History of Present Illness Pt is [age] year woman who lives at home with her grandaughter and sustained a fall with severe L hip pain and underwent intramedullary (IM) nailing of l.eft hip with trochanteric fixation nail on 02-11-17 by Dr Hyacinth MeekerMiller and is PWB and hx of atrial fibrillation and rapid ventricular rate..   Clinical Impression   Pt is 83 year old woman who has a history of severe dementia, fell at home and underwent a L hip IM nailing.  She was seen for brief eval.  She is restless and alert in bed and oriented to city and name but only if stated to her, she is not able to state her full name.  No family present to assist with baseline. She presents with difficulty following one step commands and most likely will not be able to follow instructions for an AD training for ADLs.  Rec OT for family ed and training only for ADLs at home since patient's grandaughter is refusing SNF and [facility].  If patient is able to follow commands better as she recovers, then rec [facility], but at this time it is not recommended.    Follow Up Recommendations  SNF (gradaughter [name] is refusing SNF and [facility]    Equipment Recommendations       Recommendations for Other Services       Precautions / Restrictions Precautions Precautions: Fall Precaution Comments: Has mitts in place to prevent her from pulling out IV; severe dementia Restrictions Weight Bearing Restrictions: Yes LLE Weight Bearing: Partial weight bearing      Mobility Bed Mobility                  Transfers                      Balance                                           ADL either performed or assessed with clinical judgement   ADL Overall ADL's : Needs assistance/impaired                                        General ADL Comments: Pt currently not able to follow one step commands due to demential and is restless in bed with mitts on to prevent her from pulling out IV. She is oriented to name and city only.         Vision         Perception     Praxis      Pertinent Vitals/Pain Pain Assessment: No/denies pain     Hand Dominance     Extremity/Trunk Assessment Upper Extremity Assessment Upper Extremity Assessment: Difficult to assess due to impaired cognition (difficulty following one step commands to complete full eval )   Lower Extremity Assessment Lower Extremity Assessment: Defer to PT evaluation       Communication Communication Communication: Receptive difficulties;Expressive difficulties   Cognition Arousal/Alertness: Awake/alert Behavior During Therapy: Restless;Anxious Overall Cognitive Status: History of cognitive impairments - at baseline  General Comments: Pt with severe dementia and not able to state name but stated yes when asked her full name; stated she was in [place] and then started to ramble that was not intelligible.   General Comments       Exercises     Shoulder Instructions      Home Living Family/patient expects to be discharged to:: Private residence Living Arrangements: Children (grand daughter [name] was taking care of her at home and is guardian)                               Additional Comments: Pt not able to answer questions to complete home set up--will discuss with her grandaughter Misty Stanley when present      Prior Functioning/Environment                   OT Problem List: Decreased strength;Decreased range of motion;Decreased activity tolerance;Decreased safety awareness;Decreased cognition      OT Treatment/Interventions: Self-care/ADL training    OT Goals(Current goals can be found in the care plan section) Acute Rehab OT Goals OT  Goal Formulation: Patient unable to participate in goal setting ADL Goals Additional ADL Goal #1: Family will be educated and trained in proper assist for ADLs at home.  OT Frequency: Min 1X/week (Rec OT for family ed and training only due to severe dementia and unable to follow one step commands)   Barriers to D/C:            Co-evaluation              AM-PAC PT "6 Clicks" Daily Activity     Outcome Measure Help from another person eating meals?: Total Help from another person taking care of personal grooming?: Total Help from another person toileting, which includes using toliet, bedpan, or urinal?: Total Help from another person bathing (including washing, rinsing, drying)?: Total Help from another person to put on and taking off regular upper body clothing?: Total Help from another person to put on and taking off regular lower body clothing?: Total 6 Click Score: 6   End of Session    Activity Tolerance: Patient tolerated treatment well Patient left: in bed;with call bell/phone within reach;with bed alarm set  OT Visit Diagnosis: Muscle weakness (generalized) (M62.81);History of falling (Z91.81);Other symptoms and signs involving the nervous system (R29.898)                Time: 1610-9604 OT Time Calculation (min): 15 min Charges:  OT General Charges $OT Visit: 1 Visit OT Evaluation $OT Eval Moderate Complexity: 1 Mod G-Codes: OT G-codes **NOT FOR INPATIENT CLASS** Functional Assessment Tool Used: AM-PAC 6 Clicks Daily Activity;Clinical judgement Functional Limitation: Self care Self Care Current Status (V4098): 100 percent impaired, limited or restricted Self Care Goal Status (J1914): At least 80 percent but less than 100 percent impaired, limited or restricted   Susanne Borders, OTR/L ascom [phone] 02/12/17, 1:12 PM

## 2017-02-12 NOTE — Evaluation (Signed)
Physical Therapy Evaluation Patient Details Name: Rebecca Webb MRN: [id number] DOB: 09-22-1928 Today's Date: 02/12/2017   History of Present Illness  Pt is [age] year woman who lives at home with her grandaughter and sustained a fall with severe L hip pain and underwent intramedullary (IM) nailing of l.eft hip with trochanteric fixation nail on 02-11-17 by Dr Hyacinth MeekerMiller and is PWB and hx of atrial fibrillation and rapid ventricular rate..  Clinical Impression  Pt was unable to truly participate with PT exam.  She was mumbling incoherently much of the time, did not tolerate PROM exercises with L LE very well and generally was unable to follow instructions t/o the session.  Pt apparently was able to do a lot of walking prior to fall and sx, would like to try getting her to sitting and attempt standing but given her mental status this AM that was not a viable or safe option.  Will continue seeing pt and hope that she is better able to participate.   Follow Up Recommendations Home health PT (granddaughter able/wanting to take her home, per progress)    Equipment Recommendations   (dependent on current equipment)    Recommendations for Other Services       Precautions / Restrictions Precautions Precautions: Fall Precaution Comments: Has mitts in place to prevent her from pulling out IV; severe dementia Restrictions Weight Bearing Restrictions: Yes LLE Weight Bearing: Partial weight bearing      Mobility  Bed Mobility               General bed mobility comments: deferred any bed mobility secondary to pt's inability to follow commands, figiting, lack of awareness and c/o pain with any movement involving the L LE  Transfers                    Ambulation/Gait                Stairs            Wheelchair Mobility    Modified Rankin (Stroke Patients Only)       Balance                                             Pertinent Vitals/Pain Pain  Assessment:  (unable to rate, pt indicated pain with hip flexion/abd)    Home Living Family/patient expects to be discharged to:: Private residence Living Arrangements: Children Available Help at Discharge: Family Type of Home:  (pt unable to report)           Additional Comments: Pt not able to answer questions to complete home set up--will discuss with her grandaughter Rebecca Webb when present    Prior Function Level of Independence:  (pt unable to answer any questions)         Comments: apparently pt was able to get around/walk/etc - unsure if she used an AD     Hand Dominance        Extremity/Trunk Assessment   Upper Extremity Assessment Upper Extremity Assessment: Difficult to assess due to impaired cognition    Lower Extremity Assessment Lower Extremity Assessment: Difficult to assess due to impaired cognition (pt with minimal AROM in R, no AROM in L)       Communication   Communication: Receptive difficulties;Expressive difficulties  Cognition Arousal/Alertness: Awake/alert Behavior During Therapy: Restless;Anxious Overall Cognitive Status: History of cognitive  impairments - at baseline                                 General Comments: severe dementia, after asking pt multiple times over a few minutes she was able to state her first name, did not meaningfully answer most questions.       General Comments      Exercises General Exercises - Lower Extremity Ankle Circles/Pumps: PROM;5 reps (pt indicates pain, seemed be become upset with this act) Heel Slides: AAROM;5 reps Hip ABduction/ADduction: PROM;10 reps Straight Leg Raises: PROM;5 reps (pt seeming to actively resist this movement)   Assessment/Plan    PT Assessment Patient needs continued PT services  PT Problem List Decreased strength;Decreased range of motion;Decreased activity tolerance;Decreased balance;Decreased mobility;Decreased safety awareness;Decreased knowledge of use of  DME;Decreased cognition;Pain       PT Treatment Interventions DME instruction;Gait training;Stair training;Functional mobility training;Therapeutic activities;Therapeutic exercise;Balance training;Cognitive remediation;Patient/family education;Neuromuscular re-education    PT Goals (Current goals can be found in the Care Plan section)  Acute Rehab PT Goals Patient Stated Goal: unable to state PT Goal Formulation: Patient unable to participate in goal setting Time For Goal Achievement: 02/26/17 Potential to Achieve Goals: Poor    Frequency 7X/week (will see pt again this PM but barring significant change QD)   Barriers to discharge        Co-evaluation               AM-PAC PT "6 Clicks" Daily Activity  Outcome Measure Difficulty turning over in bed (including adjusting bedclothes, sheets and blankets)?: Unable Difficulty moving from lying on back to sitting on the side of the bed? : Unable Difficulty sitting down on and standing up from a chair with arms (e.g., wheelchair, bedside commode, etc,.)?: Unable Help needed moving to and from a bed to chair (including a wheelchair)?: Total Help needed walking in hospital room?: Total Help needed climbing 3-5 steps with a railing? : Total 6 Click Score: 6    End of Session   Activity Tolerance: Treatment limited secondary to agitation Patient left: with bed alarm set;with call bell/phone within reach Nurse Communication: Mobility status PT Visit Diagnosis: Muscle weakness (generalized) (M62.81);Difficulty in walking, not elsewhere classified (R26.2)    Time: 0981-1914 PT Time Calculation (min) (ACUTE ONLY): 10 min   Charges:   PT Evaluation $PT Eval Low Complexity: 1 Low     PT G Codes:   PT G-Codes **NOT FOR INPATIENT CLASS** Functional Assessment Tool Used: AM-PAC 6 Clicks Basic Mobility Functional Limitation: Mobility: Walking and moving around Mobility: Walking and Moving Around Current Status (N8295): 100 percent  impaired, limited or restricted Mobility: Walking and Moving Around Goal Status (A2130): At least 60 percent but less than 80 percent impaired, limited or restricted    Malachi Pro, DPT 02/12/2017, 2:13 PM

## 2017-02-13 LAB — BASIC METABOLIC PANEL
ANION GAP: 10 (ref 5–15)
BUN: 15 mg/dL (ref 6–20)
CALCIUM: 7.8 mg/dL — AB (ref 8.9–10.3)
CO2: 25 mmol/L (ref 22–32)
Chloride: 102 mmol/L (ref 101–111)
Creatinine, Ser: 0.92 mg/dL (ref 0.44–1.00)
GFR, EST NON AFRICAN AMERICAN: 55 mL/min — AB (ref >60)
Glucose, Bld: 92 mg/dL (ref 65–99)
POTASSIUM: 3.5 mmol/L (ref 3.5–5.1)
SODIUM: 137 mmol/L (ref 135–145)

## 2017-02-13 LAB — CBC
HCT: 30.6 % — ABNORMAL LOW (ref 35.0–47.0)
Hemoglobin: 10.1 g/dL — ABNORMAL LOW (ref 12.0–16.0)
MCH: 30.1 pg (ref 26.0–34.0)
MCHC: 33 g/dL (ref 32.0–36.0)
MCV: 91 fL (ref 80.0–100.0)
PLATELETS: 304 10*3/uL (ref 150–440)
RBC: 3.37 MIL/uL — AB (ref 3.80–5.20)
RDW: 14.5 % (ref 11.5–14.5)
WBC: 15.4 10*3/uL — AB (ref 3.6–11.0)

## 2017-02-13 LAB — MAGNESIUM: MAGNESIUM: 2.1 mg/dL (ref 1.7–2.4)

## 2017-02-13 LAB — GLUCOSE, CAPILLARY: GLUCOSE-CAPILLARY: 82 mg/dL (ref 65–99)

## 2017-02-13 MED ORDER — POTASSIUM CHLORIDE CRYS ER 20 MEQ PO TBCR: 40.0000 meq | EXTENDED_RELEASE_TABLET | Freq: Two times a day (BID) | ORAL | Status: DC

## 2017-02-13 MED ORDER — DILTIAZEM HCL 25 MG/5ML IV SOLN
20.0000 mg | Freq: Once | INTRAVENOUS | Status: DC
  Filled 2017-02-13: qty 5

## 2017-02-13 MED ORDER — DILTIAZEM HCL 25 MG/5ML IV SOLN
10.0000 mg | Freq: Once | INTRAVENOUS | Status: AC
  Administered 2017-02-13: 10 mg via INTRAVENOUS
  Filled 2017-02-13: qty 5

## 2017-02-13 MED ORDER — DILTIAZEM HCL 30 MG PO TABS
30.0000 mg | ORAL_TABLET | Freq: Four times a day (QID) | ORAL | Status: DC
  Administered 2017-02-13 – 2017-02-14 (×5): 30 mg via ORAL
  Filled 2017-02-13 (×5): qty 1

## 2017-02-13 MED ORDER — POTASSIUM CHLORIDE 10 MEQ/100ML IV SOLN
10.0000 meq | INTRAVENOUS | Status: AC
  Administered 2017-02-13 (×3): 10 meq via INTRAVENOUS
  Filled 2017-02-13 (×3): qty 100

## 2017-02-13 NOTE — Progress Notes (Signed)
Clinical Social Worker (CSW) confirmed with patient's grand-daughter/ guardian [name] that the plan is still for patient to D/C home with home health. Per [name] she still wants to take patient home and requested a hospital bed. RN case manager aware of above. Please reconsult if future social work needs arise. CSW signing off.   Baker Hughes IncorporatedBailey Cowan Pilar, LCSW [phone]

## 2017-02-13 NOTE — Care Management (Signed)
Patient requires re-positioning of the body in ways that cannot be achieved with an ordinary bed or wedge pillow, to eliminate pain, reduce pressure, prevent pressure ulcers.

## 2017-02-13 NOTE — Progress Notes (Signed)
Patient HR 127; MD notified. Orders received.

## 2017-02-13 NOTE — Progress Notes (Signed)
Physical Therapy Treatment Patient Details Name: Rebecca Webb MRN: [id number] DOB: 03-07-1929 Today's Date: 02/13/2017    History of Present Illness Pt is 23 year woman who lives at home with her grandaughter and sustained a fall with severe L hip pain and underwent intramedullary (IM) nailing of left hip with trochanteric fixation nail on 02-11-17 by Dr Hyacinth Meeker and is PWB and hx of atrial fibrillation and rapid ventricular rate..    PT Comments    Very limited ability to show meaningful participation with PT session.  Pt showed functional strength in R LE and very little tolerance for any movement on the L, in both cases that pt was essentially unable to follow cuing/instruction for exercises.  She was not able to assist in getting to sitting and was actually resistance to the effort; unable to maintain sitting posture for more than a few seconds before needing assist and ultimately laying herself back down.  Pt's mental status is a huge limiter in this situation, rehab will be a difficult task.    Follow Up Recommendations  Home health PT     Equipment Recommendations       Recommendations for Other Services       Precautions / Restrictions Precautions Precautions: Fall Precaution Comments: Has mitts in place to prevent her from pulling out IV; severe dementia Restrictions LLE Weight Bearing: Partial weight bearing    Mobility  Bed Mobility Overal bed mobility: Needs Assistance Bed Mobility: Supine to Sit;Sit to Supine     Supine to sit: Total assist;Max assist Sit to supine: Total assist;Max assist   General bed mobility comments: Pt unable to assist at all with getting to sitting at EOB, indicated pain, after sitting <30 seconds with heavy cuing/assist she tried to lay back down on the bed and would not sit back up  Transfers                 General transfer comment: deferred, pt unsafe/unable to try  Ambulation/Gait                 Stairs             Wheelchair Mobility    Modified Rankin (Stroke Patients Only)       Balance Overall balance assessment: Needs assistance Sitting-balance support: Bilateral upper extremity supported Sitting balance-Leahy Scale: Fair Sitting balance - Comments: Pt with very poor awareness, she was able to sit with only CGA for limited time at EOB but ultimately is not safe secondary to mental status                                    Cognition Arousal/Alertness: Awake/alert Behavior During Therapy: Restless;Anxious Overall Cognitive Status: History of cognitive impairments - at baseline                                 General Comments: severe dementia      Exercises General Exercises - Lower Extremity Ankle Circles/Pumps: PROM;10 reps Short Arc Quad: AAROM;5 reps Heel Slides: PROM;10 reps Hip ABduction/ADduction: PROM;10 reps Straight Leg Raises: PROM;10 reps    General Comments        Pertinent Vitals/Pain Pain Assessment:  (unable to rate, indicates pain with all L hip movement)    Home Living  Prior Function            PT Goals (current goals can now be found in the care plan section) Progress towards PT goals: Not progressing toward goals - comment (severe dementia with inability to follow instructions)    Frequency    7X/week      PT Plan Current plan remains appropriate    Co-evaluation              AM-PAC PT "6 Clicks" Daily Activity  Outcome Measure  Difficulty turning over in bed (including adjusting bedclothes, sheets and blankets)?: Unable Difficulty moving from lying on back to sitting on the side of the bed? : Unable Difficulty sitting down on and standing up from a chair with arms (e.g., wheelchair, bedside commode, etc,.)?: Unable Help needed moving to and from a bed to chair (including a wheelchair)?: Total Help needed walking in hospital room?: Total Help needed climbing 3-5 steps  with a railing? : Total 6 Click Score: 6    End of Session Equipment Utilized During Treatment: Gait belt Activity Tolerance: Treatment limited secondary to agitation Patient left: with bed alarm set;with call bell/phone within reach   PT Visit Diagnosis: Muscle weakness (generalized) (M62.81);Difficulty in walking, not elsewhere classified (R26.2)     Time: [id number] PT Time Calculation (min) (ACUTE ONLY): 26 min  Charges:  $Therapeutic Exercise: 8-22 mins $Therapeutic Activity: 8-22 mins                    G Codes:       Malachi ProGalen R Jazlyn Tippens, DPT 02/13/2017, 10:19 AM

## 2017-02-13 NOTE — Progress Notes (Signed)
OT Cancellation Note  Patient Details Name: Jerelene ReddenBirtie B Granito MRN: [id number] DOB: 09-10-1928   Cancelled Treatment:    Reason Eval/Treat Not Completed: Medical issues which prohibited therapy (Pt. unable to actively, and volitionally engage in OT treatment secondary to cognition, and severe dementia. No family present. Will continue to monitor, and intervene when appropriate.)  Olegario MessierElaine Latrease Kunde, MS, OTR/L 02/13/2017, 2:08 PM

## 2017-02-13 NOTE — Progress Notes (Signed)
[facility] [place] [facility]   PATIENT NAME: Rebecca Webb    MR#:  [id number]  DATE OF BIRTH:  [birth year]  SUBJECTIVE:  More awake and restless this am -Heart rate now in the 120's  REVIEW OF SYSTEMS:   Review of Systems  Unable to perform ROS: Dementia   DRUG ALLERGIES:   Allergies  Allergen Reactions  . Lisinopril Swelling    Tongue and lips was swollen.  . Haldol [Haloperidol Lactate] Rash    VITALS:  Blood pressure 126/69, pulse (!) 108, temperature 98 F (36.7 C), temperature source Axillary, resp. rate 16, height 5\' 2"  (1.575 m), weight 60.9 kg (134 lb 4.2 oz), SpO2 97 %.  PHYSICAL EXAMINATION:   Physical Exam  GENERAL:  83 y.o.-year-old patient lying in the bed with no acute distress. Thin, restless EYES: Pupils equal, round, reactive to light and accommodation. No scleral icterus. Extraocular muscles intact.  HEENT: Head atraumatic, normocephalic. Oropharynx and nasopharynx clear.  NECK:  Supple, no jugular venous distention. No thyroid enlargement, no tenderness.  LUNGS: Normal breath sounds bilaterally, no wheezing, rales, rhonchi. No use of accessory muscles of respiration.  CARDIOVASCULAR: S1, S2 normal. No murmurs, rubs, or gallops.  ABDOMEN: Soft, nontender, nondistended. Bowel sounds present. No organomegaly or mass.  EXTREMITIES: No cyanosis, clubbing or edema b/l.   [place]itt[place]++ NEUROLOGIC: unable to assess  PSYCHIATRIC restless, severe dementia SKIN: No obvious rash, lesion, or ulcer.   LABORATORY PANEL:  CBC  Recent Labs Lab 02/13/17 0826  WBC 15.4*  HGB 10.1*  HCT 30.6*  PLT 304    Chemistries   Recent Labs Lab 02/09/17 0436  02/13/17 0826  NA 145  < > 137  K 3.6  < > 3.5  CL 116*  < > 102  CO2 21*  < > 25  GLUCOSE 134*  < > 92  BUN 24*  < > 15  CREATININE 1.40*  < > 0.92  CALCIUM 7.9*  < > 7.8*  MG 1.6*  < > 2.1  AST 23  --   --   ALT 20  --   --   ALKPHOS 65  --   --   BILITOT 0.7  --   --   < >  = values in this interval not displayed. Cardiac Enzymes  Recent Labs Lab 02/09/17 0821  TROPONINI <0.03   RADIOLOGY:  No results found. ASSESSMENT AND PLAN:  83 y.o. female with known essential hypertension mixed hyperlipidemia dementia chronic kidney disease stage III having recent fall with significant hip pain and probable fracture of for which the patient is unable to ambulate. The patient was seen in the emergency room for further treatment options and has noticed that the patient was having atrial fibrillation with rapid ventricular rate  #1 acute left hip fracture After mechanical fall at home 4 days ago -consulted orthopedics with Rebecca Webb -pt is POD# 2 Left hip pinning by Rebecca Webb - pain control with IV morphine 1 mg every 6 hours prn, oral norco as needed  #2 atrial fibrillation with RVR - Patient has chronic atrial fibrillation  -Patient is now off IV amiodarone and Cardizem drip.   She is to be continued on oral amiodarone and Cardizem--HR  Today  In the 110's - cont  amiodarone to 200 mg qd and Cardizem 30 mg qid--d/w Rebecca Webb -d/c IV heparin gtt    .#3 leukocytosis: Appears reactive  #4  acute kidney injury -creatinine 1.39--1.01--0.86-- 1.40--0.84. -Good uop  #  5 CODE STATUS full code, discussed the CODE STATUS with patient's granddaughter Rebecca Webb, legal guardian  CSW for d/c planning--patient's granddaughter wants her to take her home with home health PT. Spoke with granddaughter Rebecca Webb.  She wants hospital bed to be delivered.  I have left message for case manager to work on it.    Case discussed with Care Management/Social Worker.  CODE STATUS FULL per grand dter Rebecca Webb  DVT Prophylaxis:lovenox per Rebecca Rebecca Meeker TOTAL TIME TAKING CARE OF THIS PATIENT: 25 minutes.  >50% time spent on counselling and coordination of care  Note: This dictation was prepared with Dragon dictation along with smaller phrase technology. Any transcriptional errors that  result from this process are unintentional.  Rebecca Webb M.D on 02/13/2017 at 2:17 PM  Between 7am to 6pm - Pager - [phone]  After 6pm go to [email or web address] - Social research officer, government  [facility]  Office  [phone]  CC: Primary care physician; Rebecca Webb, MDPatient ID: Rebecca Webb, female   DOB: 05-05-1928, 83 y.o.   MRN: [id number]

## 2017-02-13 NOTE — Progress Notes (Addendum)
Pharmacy Electrolyte Monitoring  83 y/o F with CAF admitted with hip fracture requiring potassium replacement.   Sodium (mmol/L)  Date Value  02/13/2017 137   Potassium (mmol/L)  Date Value  02/13/2017 3.5   Magnesium (mg/dL)  Date Value  40/98/119110/25/2018 1.6 (L)   Phosphorus (mg/dL)  Date Value  47/82/956210/22/2018 2.7   Calcium (mg/dL)  Date Value  13/08/657810/26/2018 7.8 (L)   Albumin (g/dL)  Date Value  46/96/295210/22/2018 2.4 (L)   Goal: K > 4 Mg > 2   10/25: Mg 1.6; Magnesium sulfate 4 g iv once  10/26: K 3.5; Will replace with KCl IV 10mEq x4; Mg wnl; will follow up with BMP with AM labs tomorrow   Cleopatra CedarStephanie Norvel Wenker  Pharmacy Resident

## 2017-02-14 LAB — BASIC METABOLIC PANEL
ANION GAP: 9 (ref 5–15)
BUN: 13 mg/dL (ref 6–20)
CHLORIDE: 102 mmol/L (ref 101–111)
CO2: 26 mmol/L (ref 22–32)
Calcium: 7.8 mg/dL — ABNORMAL LOW (ref 8.9–10.3)
Creatinine, Ser: 0.9 mg/dL (ref 0.44–1.00)
GFR calc Af Amer: >60 mL/min (ref >60)
GFR calc non Af Amer: 56 mL/min — ABNORMAL LOW (ref >60)
GLUCOSE: 96 mg/dL (ref 65–99)
POTASSIUM: 3.7 mmol/L (ref 3.5–5.1)
SODIUM: 137 mmol/L (ref 135–145)

## 2017-02-14 LAB — CBC
HCT: 30.2 % — ABNORMAL LOW (ref 35.0–47.0)
HEMOGLOBIN: 10 g/dL — AB (ref 12.0–16.0)
MCH: 30.7 pg (ref 26.0–34.0)
MCHC: 33.2 g/dL (ref 32.0–36.0)
MCV: 92.5 fL (ref 80.0–100.0)
Platelets: 298 10*3/uL (ref 150–440)
RBC: 3.27 MIL/uL — AB (ref 3.80–5.20)
RDW: 14.4 % (ref 11.5–14.5)
WBC: 15.3 10*3/uL — AB (ref 3.6–11.0)

## 2017-02-14 LAB — GLUCOSE, CAPILLARY: GLUCOSE-CAPILLARY: 92 mg/dL (ref 65–99)

## 2017-02-14 LAB — MAGNESIUM: MAGNESIUM: 1.8 mg/dL (ref 1.7–2.4)

## 2017-02-14 MED ORDER — FERROUS SULFATE 325 (65 FE) MG PO TABS: 325.0000 mg | ORAL_TABLET | Freq: Every day | ORAL | 3 refills | Status: AC

## 2017-02-14 MED ORDER — ENOXAPARIN SODIUM 40 MG/0.4ML ~~LOC~~ SOLN
40.0000 mg | Freq: Once | SUBCUTANEOUS | Status: AC
  Administered 2017-02-14: 40 mg via SUBCUTANEOUS

## 2017-02-14 MED ORDER — ENSURE ENLIVE PO LIQD: 237.0000 mL | Freq: Three times a day (TID) | ORAL | 12 refills | Status: AC

## 2017-02-14 MED ORDER — AMIODARONE HCL 200 MG PO TABS: 200.0000 mg | ORAL_TABLET | Freq: Every day | ORAL | 0 refills | Status: DC

## 2017-02-14 MED ORDER — METOPROLOL TARTRATE 25 MG PO TABS: 25.0000 mg | ORAL_TABLET | Freq: Two times a day (BID) | ORAL | Status: DC

## 2017-02-14 MED ORDER — METOPROLOL TARTRATE 25 MG PO TABS: 12.5000 mg | ORAL_TABLET | Freq: Two times a day (BID) | ORAL | 0 refills | Status: DC

## 2017-02-14 MED ORDER — METOPROLOL TARTRATE 25 MG PO TABS: 12.5000 mg | ORAL_TABLET | Freq: Two times a day (BID) | ORAL | Status: DC

## 2017-02-14 MED ORDER — POTASSIUM CHLORIDE 10 MEQ/100ML IV SOLN
10.0000 meq | INTRAVENOUS | Status: DC
  Filled 2017-02-14: qty 100

## 2017-02-14 MED ORDER — ENOXAPARIN SODIUM 40 MG/0.4ML ~~LOC~~ SOLN: 40.0000 mg | SUBCUTANEOUS | 0 refills | Status: DC

## 2017-02-14 MED ORDER — HYDROCODONE-ACETAMINOPHEN 5-325 MG PO TABS: 1.0000 | ORAL_TABLET | Freq: Four times a day (QID) | ORAL | 0 refills | Status: DC | PRN

## 2017-02-14 MED ORDER — DILTIAZEM HCL 30 MG PO TABS: 30.0000 mg | ORAL_TABLET | Freq: Four times a day (QID) | ORAL | 0 refills | Status: DC

## 2017-02-14 NOTE — Progress Notes (Signed)
OT Cancellation Note  Patient Details Name: Rebecca ReddenBirtie B Webb MRN: [id number] DOB: [birth year]   Cancelled Treatment:    Reason Eval/Treat Not Completed: Medical issues which prohibited therapy (Resting HR is elevated to 130 this a.m. which is contraindicated for OT services at this time. Nursing requests to hold this a.m. WIll conitnue to monitor, and intervene at a later time/date as appropriate.)  Olegario MessierElaine Ren Grasse, MS, OTR/L 02/14/2017, 11:07 AM

## 2017-02-14 NOTE — Progress Notes (Signed)
  Subjective:  Patient resting comfortably in bed.  Family at the bedside.   Patient in afib, HR 108 - 131  Objective:   VITALS:   Vitals:   02/13/17 1029 02/13/17 2029 02/13/17 2315 02/14/17 0615  BP: 126/69 98/71 120/78 123/86  Pulse: (!) 108 (!) 131 (!) 113 (!) 121  Resp: 16 18    Temp:  98.4 F (36.9 C)    TempSrc:      SpO2: 97% 93%    Weight:      Height:        PHYSICAL EXAM: Patient was not awaken for the exam today.   She has spontaneous movement of her toes and ankle on the left side.  Pedal pulses are palpable.   Dressing with moderate drainage.   LABS  Results for orders placed or performed during the hospital encounter of 02/05/17 (from the past 24 hour(s))  CBC     Status: Abnormal   Collection Time: 02/14/17  5:09 AM  Result Value Ref Range   WBC 15.3 (H) 3.6 - 11.0 K/uL   RBC 3.27 (L) 3.80 - 5.20 MIL/uL   Hemoglobin 10.0 (L) 12.0 - 16.0 g/dL   HCT 16.130.2 (L) 09.635.0 - 04.547.0 %   MCV 92.5 80.0 - 100.0 fL   MCH 30.7 26.0 - 34.0 pg   MCHC 33.2 32.0 - 36.0 g/dL   RDW 40.914.4 81.111.5 - 91.414.5 %   Platelets 298 150 - 440 K/uL  Basic metabolic panel     Status: Abnormal   Collection Time: 02/14/17  5:09 AM  Result Value Ref Range   Sodium 137 135 - 145 mmol/L   Potassium 3.7 3.5 - 5.1 mmol/L   Chloride 102 101 - 111 mmol/L   CO2 26 22 - 32 mmol/L   Glucose, Bld 96 65 - 99 mg/dL   BUN 13 6 - 20 mg/dL   Creatinine, Ser 7.820.90 0.44 - 1.00 mg/dL   Calcium 7.8 (L) 8.9 - 10.3 mg/dL   GFR calc non Af Amer 56 (L) >60 mL/min   GFR calc Af Amer >60 >60 mL/min   Anion gap 9 5 - 15  Magnesium     Status: None   Collection Time: 02/14/17  5:09 AM  Result Value Ref Range   Magnesium 1.8 1.7 - 2.4 mg/dL  Glucose, capillary     Status: None   Collection Time: 02/14/17  7:33 AM  Result Value Ref Range   Glucose-Capillary 92 65 - 99 mg/dL   Comment 1 Notify RN     No results found.  Assessment/Plan: 3 Days Post-Op   Active Problems:   Closed left hip fracture, initial  encounter [facility](HCC)  Patient stable from an orthopaedic standpoint.   Patient in a fib.  Continue PT/OT.  Possible discharge today.  Patient will follow up in the office in approximately 10 days for wound check, staple removal and xrays with Dr. Hyacinth MeekerMiller.   Nurses will change Aquacel dressing prior to discharge.    Juanell FairlyKRASINSKI, Francyne Arreaga , MD 02/14/2017, 12:30 PM

## 2017-02-14 NOTE — Progress Notes (Signed)
Pt discharged to home with family. PICC line removed prior to D/C. 36cm, catheter intact.

## 2017-02-14 NOTE — Progress Notes (Signed)
Pharmacy Electrolyte Monitoring  83 y/o F with CAF admitted with hip fracture requiring potassium replacement.   Sodium (mmol/L)  Date Value  02/14/2017 137   Potassium (mmol/L)  Date Value  02/14/2017 3.7   Magnesium (mg/dL)  Date Value  04/54/098110/26/2018 2.1   Phosphorus (mg/dL)  Date Value  19/14/782910/22/2018 2.7   Calcium (mg/dL)  Date Value  56/21/308610/27/2018 7.8 (L)   Albumin (g/dL)  Date Value  57/84/696210/22/2018 2.4 (L)   Goal: K > 4 Mg > 2    K= 3.5. Will give KCl 10 mEq IV x 4 doses. Will recheck with am labs.   Rebecca Webb, PharmD

## 2017-02-14 NOTE — Care Management Note (Signed)
Case Management Note  Patient Details  Name: Rebecca Webb MRN: [id number] Date of Birth: Jun 27, 1928  Subjective/Objective:  Jermaine from [facility] called to report that [facility] could not provide HH=RN services. This Clinical research associatewriter discussed other options with [name] who agreed that a referral for HH=RN, PT be [facility]. [name] was [name] to call if there were problems or if questions arise before [facility] can do a start of care on Monday.                   Action/Plan:   Expected Discharge Date:  02/14/17               Expected Discharge Plan:  Home w Home Health Services  In-House Referral:     Discharge planning Services  CM Consult  Post Acute Care Choice:  Durable Medical Equipment, Home Health [facility] offered [facility]:  [facility], Adult Children  DME Arranged:  Bedside commode, Walker rolling, Hospital bed DME Agency:  [facility]  [facility]  [name]  Status of Service:  Completed, signed off  If discussed at Long Length of Stay Meetings, dates discussed:    Additional Comments:  [name] A, RN 02/14/2017, 1:03 PM

## 2017-02-14 NOTE — Care Management Note (Signed)
Case Management Note  Patient Details  Name: Rebecca Webb MRN: [id number] Date of Birth: 1928/05/30  Subjective/Objective:      Call to Rebecca Webb at [facility] about delivery today to Rebecca Webb's home of a hospital bed, RW, BSC. [facility]. Information for EMS transport placed on Rebecca Webb's chart for EMS.              Action/Plan:   Expected Discharge Date:  02/14/17               Expected Discharge Plan:  Home w Home Health Services  In-House Referral:     Discharge planning Services  CM Consult  Post Acute Care Choice:  Durable Medical Equipment, Home Health [facility], Adult Children  DME Arranged:  Bedside commode, Walker rolling, Hospital bed DME Agency:  [facility]  [facility]  [facility]  Status of Service:  Completed, signed off  If discussed at Long Length of Stay Meetings, dates discussed:    Additional Comments:  Rebecca Webb A, RN 02/14/2017, 11:16 AM

## 2017-02-14 NOTE — Progress Notes (Signed)
Physical Therapy Treatment Patient Details Name: Rebecca Webb MRN: [id number] DOB: 10/11/1928 Today's Date: 02/14/2017    History of Present Illness Pt is [age] year woman who lives at home with her grandaughter and sustained a fall with severe L hip pain and underwent intramedullary (IM) nailing of left hip with trochanteric fixation nail on 02-11-17 by Dr Hyacinth MeekerMiller and is PWB and hx of atrial fibrillation and rapid ventricular rate..    PT Comments    Tolerated limited P/AAROM in supine.  Resists movements at times but generally seems comfortable.    Follow Up Recommendations  Home health PT     Equipment Recommendations       Recommendations for Other Services       Precautions / Restrictions Precautions Precautions: Fall Precaution Comments: Has mitts in place to prevent her from pulling out IV; severe dementia Restrictions Weight Bearing Restrictions: Yes LLE Weight Bearing: Partial weight bearing    Mobility  Bed Mobility                  Transfers                    Ambulation/Gait                 Stairs            Wheelchair Mobility    Modified Rankin (Stroke Patients Only)       Balance                                            Cognition Arousal/Alertness: Awake/alert Behavior During Therapy: Anxious;Restless Overall Cognitive Status: History of cognitive impairments - at baseline                                 General Comments: severe dementia      Exercises General Exercises - Lower Extremity Ankle Circles/Pumps: PROM;10 reps Short Arc Quad: AAROM;5 reps Heel Slides: PROM;10 reps Hip ABduction/ADduction: PROM;10 reps Straight Leg Raises: PROM;10 reps    General Comments        Pertinent Vitals/Pain Pain Assessment: Faces Faces Pain Scale: Hurts a little bit    Home Living                      Prior Function            PT Goals (current goals can now be  found in the care plan section) Progress towards PT goals: Not progressing toward goals - comment    Frequency    7X/week      PT Plan Current plan remains appropriate    Co-evaluation              AM-PAC PT "6 Clicks" Daily Activity  Outcome Measure  Difficulty turning over in bed (including adjusting bedclothes, sheets and blankets)?: Unable Difficulty moving from lying on back to sitting on the side of the bed? : Unable Difficulty sitting down on and standing up from a chair with arms (e.g., wheelchair, bedside commode, etc,.)?: Unable Help needed moving to and from a bed to chair (including a wheelchair)?: Total Help needed walking in hospital room?: Total Help needed climbing 3-5 steps with a railing? : Total 6 Click Score: 6    End of Session  Activity Tolerance: Treatment limited secondary to agitation Patient left: with bed alarm set;with call bell/phone within reach         Time: 1522-1530 PT Time Calculation (min) (ACUTE ONLY): 8 min  Charges:  $Therapeutic Exercise: 8-22 mins                    G Codes:       Danielle Dess, PTA 02/14/17, 3:32 PM

## 2017-02-14 NOTE — Discharge Summary (Signed)
[facility]   PATIENT NAME: Rebecca Webb    MR#:  [id number]  DATE OF BIRTH:  [birth year]  DATE OF ADMISSION:  02/05/2017 ADMITTING PHYSICIAN: Katha Hamming, MD  DATE OF DISCHARGE: *02/14/2017  PRIMARY CARE PHYSICIAN: Leotis Shames, MD    ADMISSION DIAGNOSIS:  Atrial fibrillation with rapid ventricular response (HCC) [I48.91] Closed fracture of left hip, initial encounter (HCC) [S72.002A] Fall, initial encounter [W19.XXXA] Dementia without behavioral disturbance, unspecified dementia type [F03.90]  DISCHARGE DIAGNOSIS:  Acute closed left hip fracture s/p pinning by dr Hyacinth Meeker afib with rvr Acute renal failure --resolved Severe dementia SECONDARY DIAGNOSIS:   Past Medical History:  Diagnosis Date  . Atrial fibrillation (HCC)   . Chronic kidney disease   . Dementia   . Dementia   . Depression   . GERD (gastroesophageal reflux disease)   . Hyperlipemia   . Hypertension   . Vitamin D deficiency     HOSPITAL COURSE:   84 y.o.femalewith known essential hypertension mixed hyperlipidemia dementia chronic kidney disease stage III having recent fall with significant hip pain and probable fracture of for which the patient is unable to ambulate. The patient was seen in the emergency room for further treatment options and has noticed that the patient was having atrial fibrillation with rapid ventricular rate  #1 acute left hip fracture After mechanical fall at home 4 days ago -consulted orthopedics with Dr Hyacinth Meeker -pt is POD# 3  Left hip pinning by dr Hyacinth Meeker - pain control with IV morphine 1 mg every 6 hours prn, oral norco as needed  #2 atrial fibrillation with RVR - Patient has chronic atrial fibrillation -Patient is now off IV amiodarone and Cardizem drip.   She is to be continued on oral amiodarone and Cardizem--HR  Today  In the 110's - cont  amiodarone to 200 mg qd and Cardizem 30 mg qid--d/w Dr Gwen Pounds -d/w  grand dter her HR is in the 100's but increases with activity. Due to her soft bP not much room to increase meds at present. She voiced understanding.  .#3 leukocytosis: Appears reactive  #4  acute kidney injury -creatinine 1.39--1.01--0.86-- 1.40--0.84. -Good uop  D/c home today once hospital bed is deliverd. D/w Vanice Sarah in the room  CONSULTS OBTAINED:  Treatment Team:  Deeann Saint, MD Lamar Blinks, MD  DRUG ALLERGIES:   Allergies  Allergen Reactions  . Lisinopril Swelling    Tongue and lips was swollen.  . Haldol [Haloperidol Lactate] Rash    DISCHARGE MEDICATIONS:   Current Discharge Medication List    START taking these medications   Details  amiodarone (PACERONE) 200 MG tablet Take 1 tablet (200 mg total) by mouth daily. Qty: 30 tablet, Refills: 0    diltiazem (CARDIZEM) 30 MG tablet Take 1 tablet (30 mg total) by mouth every 6 (six) hours. Qty: 180 tablet, Refills: 0    enoxaparin (LOVENOX) 40 MG/0.4ML injection Inject 0.4 mLs (40 mg total) into the skin daily. Qty: 14 Syringe, Refills: 0    feeding supplement, ENSURE ENLIVE, (ENSURE ENLIVE) LIQD Take 237 mLs by mouth 3 (three) times daily between meals. Qty: 237 mL, Refills: 12    ferrous sulfate 325 (65 FE) MG tablet Take 1 tablet (325 mg total) by mouth daily with breakfast. Qty: 30 tablet, Refills: 3    HYDROcodone-acetaminophen (NORCO/VICODIN) 5-325 MG tablet Take 1-2 tablets by mouth every 6 (six) hours as needed for moderate pain. Qty: 20 tablet, Refills: 0  metoprolol tartrate (LOPRESSOR) 25 MG tablet Take 0.5 tablets (12.5 mg total) by mouth 2 (two) times daily. Qty: 60 tablet, Refills: 0      CONTINUE these medications which have NOT CHANGED   Details  aspirin EC 81 MG tablet Take 81 mg by mouth daily.     diltiazem (CARDIZEM LA) 240 MG 24 hr tablet Take 1 tablet (240 mg total) by mouth daily. Qty: 30 tablet, Refills: 6    Memantine HCl ER 21 MG CP24 Take 21 mg by mouth  daily. Qty: 30 capsule, Refills: 2    Sennosides-Docusate Sodium (SENEXON-S PO) Take 2 tablets by mouth daily.    Vitamin D, Ergocalciferol, (DRISDOL) 50000 UNITS CAPS capsule Take 50,000 Units by mouth every 7 (seven) days.         If you experience worsening of your admission symptoms, develop shortness of breath, life threatening emergency, suicidal or homicidal thoughts you must seek medical attention immediately by calling 911 or calling your MD immediately  if symptoms less severe.  You Must read complete instructions/literature along with all the possible adverse reactions/side effects for all the Medicines you take and that have been prescribed to you. Take any new Medicines after you have completely understood and accept all the possible adverse reactions/side effects.   Please note  You were cared for by a hospitalist during your hospital stay. If you have any questions about your discharge medications or the care you received while you were in the hospital after you are discharged, you can call the unit and asked to speak with the hospitalist on call if the hospitalist that took care of you is not available. Once you are discharged, your primary care physician will handle any further medical issues. Please note that NO REFILLS for any discharge medications will be authorized once you are discharged, as it is imperative that you return to your primary care physician (or establish a relationship with a primary care physician if you do not have one) for your aftercare needs so that they can reassess your need for medications and monitor your lab values. Today   SUBJECTIVE   confused  VITAL SIGNS:  Blood pressure 123/86, pulse (!) 121, temperature 98.4 F (36.9 C), resp. rate 18, height 5\' 2"  (1.575 m), weight 60.9 kg (134 lb 4.2 oz), SpO2 93 %.  I/O:   Intake/Output Summary (Last 24 hours) at 02/14/17 1035 Last data filed at 02/14/17 1016  Gross per 24 hour  Intake              1950 ml  Output             2600 ml  Net             -650 ml    PHYSICAL EXAMINATION:  GENERAL:  83 y.o.-year-old patient lying in the bed with no acute distress.  EYES: Pupils equal, round, reactive to light and accommodation. No scleral icterus. Extraocular muscles intact.  HEENT: Head atraumatic, normocephalic. Oropharynx and nasopharynx clear.  NECK:  Supple, no jugular venous distention. No thyroid enlargement, no tenderness.  LUNGS: Normal breath sounds bilaterally, no wheezing, rales,rhonchi or crepitation. No use of accessory muscles of respiration.  CARDIOVASCULAR: S1, S2 normal. No murmurs, rubs, or gallops.  ABDOMEN: Soft, non-tender, non-distended. Bowel sounds present. No organomegaly or mass.  EXTREMITIES: No pedal edema, cyanosis, or clubbing.  NEUROLOGIC: Cranial nerves II through XII are intact. Muscle strength 5/5 in all extremities. Sensation intact. Gait not checked.  PSYCHIATRIC: The  patient is alert and oriented x 3.  SKIN: No obvious rash, lesion, or ulcer.   DATA REVIEW:   CBC   Recent Labs Lab 02/14/17 0509  WBC 15.3*  HGB 10.0*  HCT 30.2*  PLT 298    Chemistries   Recent Labs Lab 02/09/17 0436  02/14/17 0509  NA 145  < > 137  K 3.6  < > 3.7  CL 116*  < > 102  CO2 21*  < > 26  GLUCOSE 134*  < > 96  BUN 24*  < > 13  CREATININE 1.40*  < > 0.90  CALCIUM 7.9*  < > 7.8*  MG 1.6*  < > 1.8  AST 23  --   --   ALT 20  --   --   ALKPHOS 65  --   --   BILITOT 0.7  --   --   < > = values in this interval not displayed.  Microbiology Results   Recent Results (from the past 240 hour(s))  MRSA PCR Screening     Status: None   Collection Time: 02/06/17  1:18 PM  Result Value Ref Range Status   MRSA by PCR NEGATIVE NEGATIVE Final    Comment:        The GeneXpert MRSA Assay (FDA approved for NASAL specimens only), is one component of a comprehensive MRSA colonization surveillance program. It is not intended to diagnose MRSA infection nor to  guide or monitor treatment for MRSA infections.     RADIOLOGY:  No results found.   Management plans discussed with the patient, family and they are in agreement.  CODE STATUS:     Code Status Orders        Start     Ordered   02/05/17 2004  Full code  Continuous     02/05/17 2009    Code Status History    Date Active Date Inactive Code Status Order ID Comments User Context   02/18/2015  7:56 PM 02/19/2015  3:04 PM Full Code [id number]  Milagros Loll, MD ED      TOTAL TIME TAKING CARE OF THIS PATIENT: *40* minutes.    Gary Gabrielsen M.D on 02/14/2017 at 10:35 AM  Between 7am to 6pm - Pager - [phone] After 6pm go to [email or web address] - Social research officer, government  [facility]  Office  [phone]  CC: Primary care physician; Leotis Shames, MD

## 2017-02-14 NOTE — Care Management Note (Signed)
Case Management Note  Patient Details  Name: Jerelene ReddenBirtie B Passe MRN: [id number] Date of Birth: [birth year]  Subjective/Objective:     [name] is awaiting the arrival of non-emergency EMS for transport home with family.               Action/Plan:   Expected Discharge Date:  02/14/17               Expected Discharge Plan:  Home w Home Health Services  In-House Referral:     Discharge planning Services  CM Consult  Post Acute Care Choice:  Durable Medical Equipment, Home Health [facility]  [facility], Adult Children  DME Arranged:  Bedside commode, Walker rolling, Hospital bed DME Agency:  [facility]  HH Arranged:  [facility] ([facility] was unable to provide [facility]=RN.  So referral was called to [facility]. )  Status of Service:  Completed, signed off  If discussed at Long Length of Stay Meetings, dates discussed:    Additional Comments:  Khaleesi Gruel A, RN 02/14/2017, 5:06 PM

## 2017-03-31 ENCOUNTER — Inpatient Hospital Stay
Admission: EM | Admit: 2017-03-31 | Discharge: 2017-04-12 | DRG: 871 | Disposition: A | Discharge status: Home Health | Payer: Medicare Other | Attending: Internal Medicine | Admitting: Internal Medicine

## 2017-03-31 ENCOUNTER — Emergency Department: Payer: Medicare Other

## 2017-03-31 DIAGNOSIS — L89890 Pressure ulcer of other site, unstageable: Secondary | ICD-10-CM | POA: Diagnosis present

## 2017-03-31 DIAGNOSIS — E876 Hypokalemia: Secondary | ICD-10-CM | POA: Diagnosis present

## 2017-03-31 DIAGNOSIS — A414 Sepsis due to anaerobes: Secondary | ICD-10-CM | POA: Diagnosis present

## 2017-03-31 DIAGNOSIS — N170 Acute kidney failure with tubular necrosis: Secondary | ICD-10-CM | POA: Diagnosis present

## 2017-03-31 DIAGNOSIS — N183 Chronic kidney disease, stage 3 (moderate): Secondary | ICD-10-CM | POA: Diagnosis present

## 2017-03-31 DIAGNOSIS — J9811 Atelectasis: Secondary | ICD-10-CM | POA: Diagnosis present

## 2017-03-31 DIAGNOSIS — G9349 Other encephalopathy: Secondary | ICD-10-CM | POA: Diagnosis present

## 2017-03-31 DIAGNOSIS — K219 Gastro-esophageal reflux disease without esophagitis: Secondary | ICD-10-CM | POA: Diagnosis present

## 2017-03-31 DIAGNOSIS — R748 Abnormal levels of other serum enzymes: Secondary | ICD-10-CM | POA: Diagnosis present

## 2017-03-31 DIAGNOSIS — A0472 Enterocolitis due to Clostridium difficile, not specified as recurrent: Secondary | ICD-10-CM | POA: Diagnosis present

## 2017-03-31 DIAGNOSIS — Z888 Allergy status to other drugs, medicaments and biological substances status: Secondary | ICD-10-CM

## 2017-03-31 DIAGNOSIS — Z7901 Long term (current) use of anticoagulants: Secondary | ICD-10-CM

## 2017-03-31 DIAGNOSIS — I4891 Unspecified atrial fibrillation: Secondary | ICD-10-CM | POA: Diagnosis present

## 2017-03-31 DIAGNOSIS — Z87440 Personal history of urinary (tract) infections: Secondary | ICD-10-CM

## 2017-03-31 DIAGNOSIS — R7989 Other specified abnormal findings of blood chemistry: Secondary | ICD-10-CM | POA: Diagnosis present

## 2017-03-31 DIAGNOSIS — R579 Shock, unspecified: Secondary | ICD-10-CM

## 2017-03-31 DIAGNOSIS — I959 Hypotension, unspecified: Secondary | ICD-10-CM

## 2017-03-31 DIAGNOSIS — R6520 Severe sepsis without septic shock: Secondary | ICD-10-CM | POA: Diagnosis not present

## 2017-03-31 DIAGNOSIS — E872 Acidosis: Secondary | ICD-10-CM | POA: Diagnosis present

## 2017-03-31 DIAGNOSIS — R933 Abnormal findings on diagnostic imaging of other parts of digestive tract: Secondary | ICD-10-CM | POA: Diagnosis not present

## 2017-03-31 DIAGNOSIS — E871 Hypo-osmolality and hyponatremia: Secondary | ICD-10-CM | POA: Diagnosis present

## 2017-03-31 DIAGNOSIS — Z7189 Other specified counseling: Secondary | ICD-10-CM | POA: Diagnosis not present

## 2017-03-31 DIAGNOSIS — L8990 Pressure ulcer of unspecified site, unspecified stage: Secondary | ICD-10-CM

## 2017-03-31 DIAGNOSIS — K222 Esophageal obstruction: Secondary | ICD-10-CM | POA: Diagnosis present

## 2017-03-31 DIAGNOSIS — I10 Essential (primary) hypertension: Secondary | ICD-10-CM | POA: Diagnosis present

## 2017-03-31 DIAGNOSIS — Z681 Body mass index (BMI) 19 or less, adult: Secondary | ICD-10-CM

## 2017-03-31 DIAGNOSIS — I480 Paroxysmal atrial fibrillation: Secondary | ICD-10-CM | POA: Diagnosis not present

## 2017-03-31 DIAGNOSIS — I129 Hypertensive chronic kidney disease with stage 1 through stage 4 chronic kidney disease, or unspecified chronic kidney disease: Secondary | ICD-10-CM | POA: Diagnosis present

## 2017-03-31 DIAGNOSIS — Z66 Do not resuscitate: Secondary | ICD-10-CM | POA: Diagnosis present

## 2017-03-31 DIAGNOSIS — R6521 Severe sepsis with septic shock: Secondary | ICD-10-CM | POA: Diagnosis present

## 2017-03-31 DIAGNOSIS — E785 Hyperlipidemia, unspecified: Secondary | ICD-10-CM | POA: Diagnosis present

## 2017-03-31 DIAGNOSIS — E870 Hyperosmolality and hypernatremia: Secondary | ICD-10-CM | POA: Diagnosis present

## 2017-03-31 DIAGNOSIS — F17290 Nicotine dependence, other tobacco product, uncomplicated: Secondary | ICD-10-CM | POA: Diagnosis present

## 2017-03-31 DIAGNOSIS — R1310 Dysphagia, unspecified: Secondary | ICD-10-CM

## 2017-03-31 DIAGNOSIS — K449 Diaphragmatic hernia without obstruction or gangrene: Secondary | ICD-10-CM | POA: Diagnosis present

## 2017-03-31 DIAGNOSIS — F0280 Dementia in other diseases classified elsewhere without behavioral disturbance: Secondary | ICD-10-CM | POA: Diagnosis present

## 2017-03-31 DIAGNOSIS — R001 Bradycardia, unspecified: Secondary | ICD-10-CM | POA: Diagnosis not present

## 2017-03-31 DIAGNOSIS — E860 Dehydration: Secondary | ICD-10-CM | POA: Diagnosis present

## 2017-03-31 DIAGNOSIS — G309 Alzheimer's disease, unspecified: Secondary | ICD-10-CM | POA: Diagnosis present

## 2017-03-31 DIAGNOSIS — Z452 Encounter for adjustment and management of vascular access device: Secondary | ICD-10-CM

## 2017-03-31 DIAGNOSIS — A419 Sepsis, unspecified organism: Secondary | ICD-10-CM | POA: Diagnosis present

## 2017-03-31 DIAGNOSIS — N179 Acute kidney failure, unspecified: Secondary | ICD-10-CM | POA: Diagnosis not present

## 2017-03-31 DIAGNOSIS — E43 Unspecified severe protein-calorie malnutrition: Secondary | ICD-10-CM | POA: Diagnosis present

## 2017-03-31 DIAGNOSIS — R778 Other specified abnormalities of plasma proteins: Secondary | ICD-10-CM | POA: Diagnosis present

## 2017-03-31 DIAGNOSIS — Z79899 Other long term (current) drug therapy: Secondary | ICD-10-CM

## 2017-03-31 DIAGNOSIS — Z7982 Long term (current) use of aspirin: Secondary | ICD-10-CM

## 2017-03-31 DIAGNOSIS — R4182 Altered mental status, unspecified: Secondary | ICD-10-CM

## 2017-03-31 DIAGNOSIS — K314 Gastric diverticulum: Secondary | ICD-10-CM | POA: Diagnosis present

## 2017-03-31 LAB — CBC WITH DIFFERENTIAL/PLATELET
BASOS PCT: 0 %
Basophils Absolute: 0 10*3/uL (ref 0–0.1)
Eosinophils Absolute: 0 10*3/uL (ref 0–0.7)
Eosinophils Relative: 0 %
HEMATOCRIT: 41.5 % (ref 35.0–47.0)
HEMOGLOBIN: 13.6 g/dL (ref 12.0–16.0)
LYMPHS PCT: 8 %
Lymphs Abs: 1.3 10*3/uL (ref 1.0–3.6)
MCH: 30.5 pg (ref 26.0–34.0)
MCHC: 32.6 g/dL (ref 32.0–36.0)
MCV: 93.5 fL (ref 80.0–100.0)
MONO ABS: 0.6 10*3/uL (ref 0.2–0.9)
MONOS PCT: 4 %
NEUTROS ABS: 13.9 10*3/uL — AB (ref 1.4–6.5)
NEUTROS PCT: 88 %
Platelets: 227 10*3/uL (ref 150–440)
RBC: 4.44 MIL/uL (ref 3.80–5.20)
RDW: 17 % — AB (ref 11.5–14.5)
WBC: 15.8 10*3/uL — ABNORMAL HIGH (ref 3.6–11.0)

## 2017-03-31 LAB — MRSA PCR SCREENING: MRSA by PCR: NEGATIVE

## 2017-03-31 LAB — COMPREHENSIVE METABOLIC PANEL
ALBUMIN: 2.5 g/dL — AB (ref 3.5–5.0)
ALT: 33 U/L (ref 14–54)
AST: 67 U/L — AB (ref 15–41)
Alkaline Phosphatase: 141 U/L — ABNORMAL HIGH (ref 38–126)
Anion gap: 19 — ABNORMAL HIGH (ref 5–15)
BUN: 73 mg/dL — AB (ref 6–20)
CHLORIDE: 118 mmol/L — AB (ref 101–111)
CO2: 21 mmol/L — AB (ref 22–32)
CREATININE: 3.5 mg/dL — AB (ref 0.44–1.00)
Calcium: 8.3 mg/dL — ABNORMAL LOW (ref 8.9–10.3)
GFR calc Af Amer: 13 mL/min — ABNORMAL LOW (ref >60)
GFR calc non Af Amer: 11 mL/min — ABNORMAL LOW (ref >60)
GLUCOSE: 115 mg/dL — AB (ref 65–99)
POTASSIUM: 2.6 mmol/L — AB (ref 3.5–5.1)
SODIUM: 158 mmol/L — AB (ref 135–145)
Total Bilirubin: 1 mg/dL (ref 0.3–1.2)
Total Protein: 6.1 g/dL — ABNORMAL LOW (ref 6.5–8.1)

## 2017-03-31 LAB — GLUCOSE, CAPILLARY: GLUCOSE-CAPILLARY: 111 mg/dL — AB (ref 65–99)

## 2017-03-31 LAB — PROCALCITONIN: Procalcitonin: 0.34 ng/mL

## 2017-03-31 LAB — PROTIME-INR
INR: 1.16
PROTHROMBIN TIME: 14.7 s (ref 11.4–15.2)

## 2017-03-31 LAB — C DIFFICILE QUICK SCREEN W PCR REFLEX
C DIFFICILE (CDIFF) INTERP: DETECTED
C DIFFICILE (CDIFF) TOXIN: POSITIVE — AB
C DIFFICLE (CDIFF) ANTIGEN: POSITIVE — AB

## 2017-03-31 LAB — TROPONIN I
TROPONIN I: 0.19 ng/mL — AB (ref <0.03)
Troponin I: 0.27 ng/mL (ref <0.03)

## 2017-03-31 LAB — LACTIC ACID, PLASMA
LACTIC ACID, VENOUS: 5.3 mmol/L — AB (ref 0.5–1.9)
LACTIC ACID, VENOUS: 2.3 mmol/L — AB (ref 0.5–1.9)

## 2017-03-31 LAB — LIPASE, BLOOD: Lipase: 20 U/L (ref 11–51)

## 2017-03-31 LAB — APTT

## 2017-03-31 MED ORDER — POTASSIUM CHLORIDE 10 MEQ/100ML IV SOLN
10.0000 meq | INTRAVENOUS | Status: AC
  Administered 2017-03-31 – 2017-04-01 (×4): 10 meq via INTRAVENOUS
  Filled 2017-03-31 (×4): qty 100

## 2017-03-31 MED ORDER — SODIUM CHLORIDE 0.9 % IV SOLN
INTRAVENOUS | Status: DC
  Administered 2017-04-01: 10:00:00 via INTRAVENOUS

## 2017-03-31 MED ORDER — ONDANSETRON HCL 4 MG PO TABS: 4.0000 mg | ORAL_TABLET | Freq: Four times a day (QID) | ORAL | Status: DC | PRN

## 2017-03-31 MED ORDER — VANCOMYCIN HCL IN DEXTROSE 1-5 GM/200ML-% IV SOLN
1000.0000 mg | Freq: Once | INTRAVENOUS | Status: AC
  Administered 2017-03-31: 1000 mg via INTRAVENOUS
  Filled 2017-03-31: qty 200

## 2017-03-31 MED ORDER — PIPERACILLIN-TAZOBACTAM 3.375 G IVPB
3.3750 g | Freq: Two times a day (BID) | INTRAVENOUS | Status: DC
Start: 2017-04-01 — End: 2017-03-31

## 2017-03-31 MED ORDER — HEPARIN SODIUM (PORCINE) 5000 UNIT/ML IJ SOLN
5000.0000 [IU] | Freq: Three times a day (TID) | INTRAMUSCULAR | Status: DC
  Administered 2017-03-31 – 2017-04-12 (×35): 5000 [IU] via SUBCUTANEOUS
  Filled 2017-03-31 (×34): qty 1

## 2017-03-31 MED ORDER — SODIUM CHLORIDE 0.9 % IV BOLUS (SEPSIS)
1000.0000 mL | Freq: Once | INTRAVENOUS | Status: AC
  Administered 2017-03-31: 1000 mL via INTRAVENOUS

## 2017-03-31 MED ORDER — PIPERACILLIN-TAZOBACTAM 3.375 G IVPB 30 MIN
3.3750 g | Freq: Once | INTRAVENOUS | Status: AC
  Administered 2017-03-31: 3.375 g via INTRAVENOUS
  Filled 2017-03-31: qty 50

## 2017-03-31 MED ORDER — DEXTROSE 5 % IV SOLN
INTRAVENOUS | Status: DC
  Administered 2017-03-31: via INTRAVENOUS

## 2017-03-31 MED ORDER — ONDANSETRON HCL 4 MG/2ML IJ SOLN: 4.0000 mg | Freq: Four times a day (QID) | INTRAMUSCULAR | Status: DC | PRN

## 2017-03-31 MED ORDER — ACETAMINOPHEN 650 MG RE SUPP: 650.0000 mg | Freq: Four times a day (QID) | RECTAL | Status: DC | PRN

## 2017-03-31 MED ORDER — VANCOMYCIN 50 MG/ML ORAL SOLUTION
125.0000 mg | Freq: Four times a day (QID) | ORAL | Status: DC
  Administered 2017-04-01 (×2): 125 mg via ORAL
  Filled 2017-03-31 (×3): qty 2.5

## 2017-03-31 MED ORDER — NOREPINEPHRINE BITARTRATE 1 MG/ML IV SOLN
0.0000 ug/min | Freq: Once | INTRAVENOUS | Status: AC
  Administered 2017-03-31: 2 ug/min via INTRAVENOUS
  Filled 2017-03-31: qty 4

## 2017-03-31 MED ORDER — ACETAMINOPHEN 325 MG PO TABS: 650.0000 mg | ORAL_TABLET | Freq: Four times a day (QID) | ORAL | Status: DC | PRN

## 2017-03-31 NOTE — H&P (Signed)
[facility] [place] [facility]   PATIENT NAME: Rebecca FlightBirtie Cordova    MR#:  [id number]  DATE OF BIRTH:  1929-04-15  DATE OF ADMISSION:  03/31/2017  PRIMARY CARE PHYSICIAN: Leotis ShamesSingh, Jasmine, MD   REQUESTING/REFERRING PHYSICIAN: Scotty CourtStafford, MD  CHIEF COMPLAINT:   Chief Complaint  Patient presents with  . Altered Mental Status    HISTORY OF PRESENT ILLNESS:  Rebecca Webb  is a 83 y.o. female who presents with decreased mental status.  Here in the ED she was found to meet sepsis criteria.  Granddaughter, who is her primary caregiver, states that she has had significant diarrhea for the past several days.  She has had significant loss of appetite.  Patient had a course of antibiotics which finished that a little more than a week ago, this was for UTI.  Hospitalist were called for admission and further evaluation  PAST MEDICAL HISTORY:   Past Medical History:  Diagnosis Date  . Atrial fibrillation (HCC)   . Chronic kidney disease   . Dementia   . Dementia   . Depression   . GERD (gastroesophageal reflux disease)   . Hyperlipemia   . Hypertension   . Vitamin D deficiency     PAST SURGICAL HISTORY:   Past Surgical History:  Procedure Laterality Date  . CATARACT EXTRACTION    . INTRAMEDULLARY (IM) NAIL INTERTROCHANTERIC Left 02/11/2017   Procedure: INTRAMEDULLARY (IM) NAIL INTERTROCHANTRIC;  Surgeon: Deeann SaintMiller, Howard, MD;  Location: [facility] ORS;  Service: Orthopedics;  Laterality: Left;  . KIDNEY DONATION      SOCIAL HISTORY:   Social History   Tobacco Use  . Smoking status: Never Smoker  . Smokeless tobacco: Current User    Types: Snuff  Substance Use Topics  . Alcohol use: No    FAMILY HISTORY:  No family history on file.  DRUG ALLERGIES:   Allergies  Allergen Reactions  . Lisinopril Swelling    Tongue and lips was swollen.  . Haldol [Haloperidol Lactate] Rash    MEDICATIONS AT HOME:   Prior to Admission medications   Medication Sig  Start Date End Date Taking? Authorizing Provider  amiodarone (PACERONE) 200 MG tablet Take 1 tablet (200 mg total) by mouth daily. 02/15/17   Enedina FinnerPatel, Sona, MD  aspirin EC 81 MG tablet Take 81 mg by mouth daily.     [provider]  diltiazem (CARDIZEM LA) 240 MG 24 hr tablet Take 1 tablet (240 mg total) by mouth daily. Patient taking differently: Take 240 mg by mouth 2 (two) times daily.  12/24/16   Emily FilbertWilliams, Jonathan E, MD  diltiazem (CARDIZEM) 30 MG tablet Take 1 tablet (30 mg total) by mouth every 6 (six) hours. 02/14/17   Enedina FinnerPatel, Sona, MD  enoxaparin (LOVENOX) 40 MG/0.4ML injection Inject 0.4 mLs (40 mg total) into the skin daily. 02/15/17   Enedina FinnerPatel, Sona, MD  feeding supplement, ENSURE ENLIVE, (ENSURE ENLIVE) LIQD Take 237 mLs by mouth 3 (three) times daily between meals. 02/14/17   Enedina FinnerPatel, Sona, MD  ferrous sulfate 325 (65 FE) MG tablet Take 1 tablet (325 mg total) by mouth daily with breakfast. 02/15/17   Enedina FinnerPatel, Sona, MD  HYDROcodone-acetaminophen (NORCO/VICODIN) 5-325 MG tablet Take 1-2 tablets by mouth every 6 (six) hours as needed for moderate pain. 02/14/17   Enedina FinnerPatel, Sona, MD  Memantine HCl ER 21 MG CP24 Take 21 mg by mouth daily. 08/27/16   Brandy HaleFaheem, Uzma, MD  metoprolol tartrate (LOPRESSOR) 25 MG tablet Take 0.5 tablets (12.5 mg total)  by mouth 2 (two) times daily. 02/14/17   Enedina FinnerPatel, Sona, MD  Sennosides-Docusate Sodium (SENEXON-S PO) Take 2 tablets by mouth daily.    [provider]  Vitamin D, Ergocalciferol, (DRISDOL) 50000 UNITS CAPS capsule Take 50,000 Units by mouth every 7 (seven) days.     [provider]    REVIEW OF SYSTEMS:  Review of Systems  Unable to perform ROS: Acuity of condition     VITAL SIGNS:   Vitals:   03/31/17 1956 03/31/17 1957 03/31/17 2122  BP: (!) 199/142  (!) 80/40  Pulse: 67    Resp: 18    Temp: (!) 96.9 F (36.1 C)    TempSrc: Rectal    SpO2: 100%    Weight:  40.8 kg (90 lb)    Wt Readings from Last 3 Encounters:  03/31/17  40.8 kg (90 lb)  02/08/17 60.9 kg (134 lb 4.2 oz)  12/24/16 54 kg (119 lb)    PHYSICAL EXAMINATION:  Physical Exam  Vitals reviewed. Constitutional: She appears well-developed and well-nourished. No distress.  HENT:  Head: Normocephalic and atraumatic.  Dry mucous membranes  Eyes: Conjunctivae and EOM are normal. Pupils are equal, round, and reactive to light. No scleral icterus.  Neck: Normal range of motion. Neck supple. No JVD present. No thyromegaly present.  Cardiovascular: Normal rate, regular rhythm and intact distal pulses. Exam reveals no gallop and no friction rub.  No murmur heard. Respiratory: Effort normal and breath sounds normal. No respiratory distress. She has no wheezes. She has no rales.  GI: Soft. Bowel sounds are normal. She exhibits no distension. There is no tenderness.  Musculoskeletal: Normal range of motion. She exhibits no edema.  No arthritis, no gout  Lymphadenopathy:    She has no cervical adenopathy.  Neurological: No cranial nerve deficit.  Unable to assess due to patient condition  Skin: Skin is warm and dry. No rash noted. No erythema.  Psychiatric:  Unable to assess due to patient condition    LABORATORY PANEL:   CBC Recent Labs  Lab 03/31/17 1923  WBC 15.8*  HGB 13.6  HCT 41.5  PLT 227   ------------------------------------------------------------------------------------------------------------------  Chemistries  Recent Labs  Lab 03/31/17 1923  NA 158*  K 2.6*  CL 118*  CO2 21*  GLUCOSE 115*  BUN 73*  CREATININE 3.50*  CALCIUM 8.3*  AST 67*  ALT 33  ALKPHOS 141*  BILITOT 1.0   ------------------------------------------------------------------------------------------------------------------  Cardiac Enzymes Recent Labs  Lab 03/31/17 1923  TROPONINI 0.27*   ------------------------------------------------------------------------------------------------------------------  RADIOLOGY:  Dg Chest Port 1 View  Result  Date: 03/31/2017 CLINICAL DATA:  Altered mental status. EXAM: PORTABLE CHEST 1 VIEW COMPARISON:  Single-view of the chest 02/05/2017. FINDINGS: New right IJ approach central venous catheter tip projects in the right atrium. The catheter should be withdrawn approximately 6 cm. No pneumothorax. Lungs are clear. Heart size is normal. No pleural fluid. No acute bony abnormality. IMPRESSION: Right IJ catheter tip projects within the right atrium. Recommend withdrawal of 6 cm. No pneumothorax. Lungs clear. Electronically Signed   By: Drusilla Kannerhomas  Dalessio M.D.   On: 03/31/2017 19:38    EKG:   Orders placed or performed during the hospital encounter of 03/31/17  . EKG 12-Lead  . EKG 12-Lead  . EKG 12-Lead  . EKG 12-Lead    IMPRESSION AND PLAN:  Principal Problem:   Sepsis (HCC) -patient was somewhat hypotensive in the ED even after fluid administration and required pressor support.  Her lactic acid was elevated,  will keep IV fluids and place to help wean her pressors when possible and correct her lactic acidosis.  Cultures sent from the ED and antibiotics given initially, but strong suspicion for C. difficile initially which turned out positive on check.  P.o. vancomycin then ordered. Active Problems:   AKI (acute kidney injury) (HCC) -due to her dehydration and diarrhea and her C. difficile.  Her creatinine level makes her C. difficile classification severe.  IV fluids and monitor for improvement   Elevated troponin -suspect due to demand ischemia from her sepsis and dehydration, trend her cardiac enzymes and monitor   C. difficile diarrhea -p.o. vancomycin as stated above   A-fib (HCC) -continue home rate controlling medications   Benign essential hypertension -hold antihypertensives that she is currently on pressor support  All the records are reviewed and case discussed with ED provider. Management plans discussed with the patient and/or family.  DVT PROPHYLAXIS: SubQ heparin  GI PROPHYLAXIS:  None  ADMISSION STATUS: Inpatient  CODE STATUS: Full Code Status History    Date Active Date Inactive Code Status Order ID Comments User Context   02/05/2017 20:09 02/14/2017 20:07 Full Code [id number]  Katha Hamming, MD ED   02/18/2015 19:56 02/19/2015 15:04 Full Code [id number]  Milagros Loll, MD ED      TOTAL CRITICAL CARE TIME TAKING CARE OF THIS PATIENT: 50 minutes.   Sufyan Meidinger FIELDING 03/31/2017, 9:30 PM  [facility]  Office  [phone]  CC: Primary care physician; Leotis Shames, MD  Note:  This document was prepared using Dragon voice recognition software and may include unintentional dictation errors.

## 2017-03-31 NOTE — ED Triage Notes (Signed)
[facility]  EMS states that pt has not eaten since Saturday and was recently treated for UTI.  Pt currently has diarrhea.  Pt is not alert or oriented at this time.  Extremities cold to the touch and mottling noted to bilateral lower extremities and bilateral upper extremities.  Family present and assisting with triage information.

## 2017-03-31 NOTE — Consult Note (Signed)
Name: Rebecca Webb MRN: [id number] DOB: 1928/05/22    ADMISSION DATE:  03/31/2017  CONSULTATION DATE:  03/31/17  REFERRING MD : Dr. Anne HahnWillis  CHIEF COMPLAINT:  AMS  BRIEF PATIENT DESCRIPTION: 83 year old female with history of dementia, admitted with diarrhea, dehydration and poor oral intake for past 4 days.  Now in renal failure and possible sepsis with c-diff. On levophed for hemodynamic support.  SIGNIFICANT EVENTS  12/11 Patient admitted with septic shock and Acute Renal failure.  On pressors for hemodynamic support.  STUDIES:  None   HISTORY OF PRESENT ILLNESS: Rebecca FlightBirtie Cosio is an 83 year old female with known history of Atrial Fibrillation,Chronic kidney disease,dementia,depression,GERD,Hyperlipidemia and hypertension.  According to her Granddaughter she had hip fracture in the month of October and was slowly getting back her on feet. She was recently diagnosed with UTI which made her condition worst.  She had very less oral intake and was taking puree food.  However she stopped eating and drinking for the past  4 days and has severe diarrhea.  She was brought by her grand-daughter for generalized weakness and fatigue. In the ED she was noted to be hypotensive, received 2 liters of fluids.  Her lactic acid was elevated.  Patient was admitted for septic shock possibly related to c-diff. She was strted on pressors and broad spectrum antibiotics.  CCM team was consulted for further management.  PAST MEDICAL HISTORY :   has a past medical history of Atrial fibrillation (HCC), Chronic kidney disease, Dementia, Dementia, Depression, GERD (gastroesophageal reflux disease), Hyperlipemia, Hypertension, and Vitamin D deficiency.  has a past surgical history that includes Cataract extraction; Kidney donation; and Intramedullary (im) nail intertrochanteric (Left, 02/11/2017). Prior to Admission medications   Medication Sig Start Date End Date Taking? Authorizing Provider  aspirin EC 81 MG  tablet Take 81 mg by mouth daily.    Yes [provider]  diltiazem (CARDIZEM) 30 MG tablet Take 1 tablet (30 mg total) by mouth every 6 (six) hours. 02/14/17  Yes Enedina FinnerPatel, Sona, MD  ferrous sulfate 325 (65 FE) MG tablet Take 1 tablet (325 mg total) by mouth daily with breakfast. 02/15/17  Yes Enedina FinnerPatel, Sona, MD  Vitamin D, Ergocalciferol, (DRISDOL) 50000 UNITS CAPS capsule Take 50,000 Units by mouth every 7 (seven) days.    Yes [provider]  amiodarone (PACERONE) 200 MG tablet Take 1 tablet (200 mg total) by mouth daily. Patient not taking: Reported on 03/31/2017 02/15/17   Enedina FinnerPatel, Sona, MD  diltiazem (CARDIZEM LA) 240 MG 24 hr tablet Take 1 tablet (240 mg total) by mouth daily. Patient not taking: Reported on 03/31/2017 12/24/16   Emily FilbertWilliams, Jonathan E, MD  enoxaparin (LOVENOX) 40 MG/0.4ML injection Inject 0.4 mLs (40 mg total) into the skin daily. Patient not taking: Reported on 03/31/2017 02/15/17   Enedina FinnerPatel, Sona, MD  feeding supplement, ENSURE ENLIVE, (ENSURE ENLIVE) LIQD Take 237 mLs by mouth 3 (three) times daily between meals. 02/14/17   Enedina FinnerPatel, Sona, MD  HYDROcodone-acetaminophen (NORCO/VICODIN) 5-325 MG tablet Take 1-2 tablets by mouth every 6 (six) hours as needed for moderate pain. Patient not taking: Reported on 03/31/2017 02/14/17   Enedina FinnerPatel, Sona, MD  Memantine HCl ER 21 MG CP24 Take 21 mg by mouth daily. Patient not taking: Reported on 03/31/2017 08/27/16   Brandy HaleFaheem, Uzma, MD  metoprolol tartrate (LOPRESSOR) 25 MG tablet Take 0.5 tablets (12.5 mg total) by mouth 2 (two) times daily. 02/14/17   Enedina FinnerPatel, Sona, MD  Sennosides-Docusate Sodium (SENEXON-S PO) Take  2 tablets by mouth daily.    [provider]   Allergies  Allergen Reactions  . Lisinopril Swelling    Tongue and lips was swollen.  . Haldol [Haloperidol Lactate] Rash    FAMILY HISTORY:  family history is not on file. SOCIAL HISTORY:  reports that  has never smoked. Her smokeless tobacco use includes snuff.  She reports that she does not drink alcohol or use drugs.  REVIEW OF SYSTEMS:  Unable to obtain  SUBJECTIVE: Unable to obtain  VITAL SIGNS: Temp:  [96.5 F (35.8 C)-96.9 F (36.1 C)] 96.5 F (35.8 C) (12/11 2201) Pulse Rate:  [53-67] 57 (12/11 2150) Resp:  [10-18] 10 (12/11 2150) BP: (74-199)/(40-142) 100/70 (12/11 2150) SpO2:  [100 %] 100 % (12/11 2150) Weight:  [90 lb (40.8 kg)] 90 lb (40.8 kg) (12/11 1957)  PHYSICAL EXAMINATION: General:  Elderly , sickly appearing female on bed. Neuro:  Unable to assess HEENT:  AT,[place],No jvd Cardiovascular: S1S2,Tachycardic Lungs: clear bilaterally , no wheezes,crackles and rhonchi Abdomen:  Soft,NT,ND Musculoskeletal:  Multiple bruises, no edema,cyanosis Skin: loose, dry and warm  Recent Labs  Lab 03/31/17 1923  NA 158*  K 2.6*  CL 118*  CO2 21*  BUN 73*  CREATININE 3.50*  GLUCOSE 115*   Recent Labs  Lab 03/31/17 1923  HGB 13.6  HCT 41.5  WBC 15.8*  PLT 227   Dg Chest Port 1 View  Result Date: 03/31/2017 CLINICAL DATA:  Altered mental status. EXAM: PORTABLE CHEST 1 VIEW COMPARISON:  Single-view of the chest 02/05/2017. FINDINGS: New right IJ approach central venous catheter tip projects in the right atrium. The catheter should be withdrawn approximately 6 cm. No pneumothorax. Lungs are clear. Heart size is normal. No pleural fluid. No acute bony abnormality. IMPRESSION: Right IJ catheter tip projects within the right atrium. Recommend withdrawal of 6 cm. No pneumothorax. Lungs clear. Electronically Signed   By: Drusilla Kannerhomas  Dalessio M.D.   On: 03/31/2017 19:38    ASSESSMENT / PLAN:  Septic shock possibly related to C-diff Acute on Chronic kidney disease Hypernatremia Hypokalemia Hypoalbuminemia Lactic acidosis Hx of UTI Hx of Dementia    Plan Continue Levophed Will maintain MAP goal>60 Received Fluid bolus Monitor fever,cbc Check stool for C-diff Replace electrolytes  Follow BMET Trend troponin Will start on po  vancomycin PCT= 0.34 Follow up ECHO Follow cultures Will hold home dose diltiazem/metoprolol Will start on D5W Monitor I/O    Alverta Caccamo,AG-ACNP Pulmonary and Critical Care Medicine [facility]   03/31/2017, 10:16 PM

## 2017-03-31 NOTE — ED Provider Notes (Addendum)
[facility] Emergency Department Provider Note  ____________________________________________  Time seen: Approximately 7:36 PM  I have reviewed the triage vital signs and the nursing notes.   HISTORY  Chief Complaint Altered Mental Status  Level 5 Caveat: Portions of the History and Physical were unable to be obtained due to altered mental status.   HPI Rebecca Webb is a 83 y.o. female brought to the ED due to generalized weakness and profound fatigue. Not eating or drinking for the past 3 days. Also reported to have frequent diarrhea.  Family at bedside reports that the patient had a hip fracture several months ago, and just that she is starting to get back on her feet and uses a walker she developed a urinary tract infection which caused her to have worsening debilitation. She stopped eating much at that point, and then over the last several days she just has 0 oral intake.     Past Medical History:  Diagnosis Date  . Atrial fibrillation (HCC)   . Chronic kidney disease   . Dementia   . Dementia   . Depression   . GERD (gastroesophageal reflux disease)   . Hyperlipemia   . Hypertension   . Vitamin D deficiency      Patient Active Problem List   Diagnosis Date Noted  . AKI (acute kidney injury) (HCC) 03/31/2017  . Elevated troponin 03/31/2017  . SIRS (systemic inflammatory response syndrome) (HCC) 03/31/2017  . Closed left hip fracture, initial encounter (HCC) 02/05/2017  . Agitation 12/05/2015  . Chronic insomnia 12/05/2015  . Pure hypercholesterolemia 08/24/2015  . CKD (chronic kidney disease) stage 3, GFR 30-59 ml/min (HCC) 08/23/2015  . Depression, major, single episode, complete remission (HCC) 08/23/2015  . Prediabetes 08/23/2015  . Vitamin D deficiency 08/23/2015  . Urinary incontinence in female 08/23/2015  . Alzheimer's dementia 02/22/2015  . Benign essential hypertension 02/22/2015  . Gastroesophageal reflux disease 02/22/2015   . Hyperlipidemia, mixed 02/22/2015  . Shortness of breath 02/22/2015  . A-fib (HCC) 02/18/2015     Past Surgical History:  Procedure Laterality Date  . CATARACT EXTRACTION    . INTRAMEDULLARY (IM) NAIL INTERTROCHANTERIC Left 02/11/2017   Procedure: INTRAMEDULLARY (IM) NAIL INTERTROCHANTRIC;  Surgeon: Deeann SaintMiller, Howard, MD;  Location: [facility] ORS;  Service: Orthopedics;  Laterality: Left;  . KIDNEY DONATION       Prior to Admission medications   Medication Sig Start Date End Date Taking? Authorizing Provider  amiodarone (PACERONE) 200 MG tablet Take 1 tablet (200 mg total) by mouth daily. 02/15/17   Enedina FinnerPatel, Sona, MD  aspirin EC 81 MG tablet Take 81 mg by mouth daily.     [provider]  diltiazem (CARDIZEM LA) 240 MG 24 hr tablet Take 1 tablet (240 mg total) by mouth daily. Patient taking differently: Take 240 mg by mouth 2 (two) times daily.  12/24/16   Emily FilbertWilliams, Jonathan E, MD  diltiazem (CARDIZEM) 30 MG tablet Take 1 tablet (30 mg total) by mouth every 6 (six) hours. 02/14/17   Enedina FinnerPatel, Sona, MD  enoxaparin (LOVENOX) 40 MG/0.4ML injection Inject 0.4 mLs (40 mg total) into the skin daily. 02/15/17   Enedina FinnerPatel, Sona, MD  feeding supplement, ENSURE ENLIVE, (ENSURE ENLIVE) LIQD Take 237 mLs by mouth 3 (three) times daily between meals. 02/14/17   Enedina FinnerPatel, Sona, MD  ferrous sulfate 325 (65 FE) MG tablet Take 1 tablet (325 mg total) by mouth daily with breakfast. 02/15/17   Enedina FinnerPatel, Sona, MD  HYDROcodone-acetaminophen (NORCO/VICODIN) 5-325 MG tablet Take 1-2  tablets by mouth every 6 (six) hours as needed for moderate pain. 02/14/17   Enedina FinnerPatel, Sona, MD  Memantine HCl ER 21 MG CP24 Take 21 mg by mouth daily. 08/27/16   Brandy HaleFaheem, Uzma, MD  metoprolol tartrate (LOPRESSOR) 25 MG tablet Take 0.5 tablets (12.5 mg total) by mouth 2 (two) times daily. 02/14/17   Enedina FinnerPatel, Sona, MD  Sennosides-Docusate Sodium (SENEXON-S PO) Take 2 tablets by mouth daily.    [provider]  Vitamin D, Ergocalciferol,  (DRISDOL) 50000 UNITS CAPS capsule Take 50,000 Units by mouth every 7 (seven) days.     [provider]     Allergies Lisinopril and Haldol [haloperidol lactate]   No family history on file.  Social History Social History   Tobacco Use  . Smoking status: Never Smoker  . Smokeless tobacco: Current User    Types: Snuff  Substance Use Topics  . Alcohol use: No  . Drug use: No    Review of Systems Unable to reliably obtained due to altered mental status.  ____________________________________________   PHYSICAL EXAM:  VITAL SIGNS: ED Triage Vitals  Enc Vitals Group     BP      Pulse      Resp      Temp      Temp src      SpO2      Weight      Height      Head Circumference      Peak Flow      Pain Score      Pain Loc      Pain Edu?      Excl. in GC?   Heart rate 60, blood pressure 75/40, respiratory rate 18. Oxygen saturation 96% on room air  Vital signs reviewed, nursing assessments reviewed.   Constitutional:   Awake, not alert, not oriented. Ill appearing. Emaciated. Eyes:   No scleral icterus.  EOMI. No conjunctival pallor. PERRL. ENT   Head:   Normocephalic and atraumatic.   Nose:   No congestion/rhinnorhea.    Mouth/Throat:   Dry mucous membranes, no pharyngeal erythema. No peritonsillar mass.    Neck:   No meningismus. Full ROM. No midline tenderness Hematological/Lymphatic/Immunilogical:   No cervical lymphadenopathy. Cardiovascular:   Irregularly irregular rhythm, rate of about 60. Symmetric bilateral radial and DP pulses.  No murmurs.  Respiratory:   Normal respiratory effort without tachypnea/retractions. Breath sounds are clear and equal bilaterally. No wheezes/rales/rhonchi. Gastrointestinal:   Soft and nontender. Non distended. There is no CVA tenderness.  No rebound, rigidity, or guarding. Genitourinary:   deferred Musculoskeletal:  Limited passive range of motion. No joint effusions or bony tenderness. No  deformities.. Neurologic:   Very limited language expression..  Motor grossly intact. Confused, not following commands  Skin:    Skin is cool to the touch peripherally with pale extremities  ____________________________________________    LABS (pertinent positives/negatives) (all labs ordered are listed, but only abnormal results are displayed) Labs Reviewed  LACTIC ACID, PLASMA - Abnormal; Notable for the following components:      Result Value   Lactic Acid, Venous 5.3 (*)    All other components within normal limits  COMPREHENSIVE METABOLIC PANEL - Abnormal; Notable for the following components:   Sodium 158 (*)    Potassium 2.6 (*)    Chloride 118 (*)    CO2 21 (*)    Glucose, Bld 115 (*)    BUN 73 (*)    Creatinine, Ser 3.50 (*)  Calcium 8.3 (*)    Total Protein 6.1 (*)    Albumin 2.5 (*)    AST 67 (*)    Alkaline Phosphatase 141 (*)    GFR calc non Af Amer 11 (*)    GFR calc Af Amer 13 (*)    Anion gap 19 (*)    All other components within normal limits  TROPONIN I - Abnormal; Notable for the following components:   Troponin I 0.27 (*)    All other components within normal limits  CBC WITH DIFFERENTIAL/PLATELET - Abnormal; Notable for the following components:   WBC 15.8 (*)    RDW 17.0 (*)    Neutro Abs 13.9 (*)    All other components within normal limits  APTT - Abnormal; Notable for the following components:   aPTT <24 (*)    All other components within normal limits  CULTURE, BLOOD (ROUTINE X 2)  CULTURE, BLOOD (ROUTINE X 2)  URINE CULTURE  LIPASE, BLOOD  PROCALCITONIN  PROTIME-INR  LACTIC ACID, PLASMA  URINALYSIS, COMPLETE (UACMP) WITH MICROSCOPIC   ____________________________________________   EKG  Interpreted by me Sinus rhythm rate of 59, normal axis, prolonged QTC of 600. Normal QRS ST segments and T waves  ____________________________________________    RADIOLOGY  Dg Chest Port 1 View  Result Date: 03/31/2017 CLINICAL DATA:   Altered mental status. EXAM: PORTABLE CHEST 1 VIEW COMPARISON:  Single-view of the chest 02/05/2017. FINDINGS: New right IJ approach central venous catheter tip projects in the right atrium. The catheter should be withdrawn approximately 6 cm. No pneumothorax. Lungs are clear. Heart size is normal. No pleural fluid. No acute bony abnormality. IMPRESSION: Right IJ catheter tip projects within the right atrium. Recommend withdrawal of 6 cm. No pneumothorax. Lungs clear. Electronically Signed   By: Drusilla Kanner M.D.   On: 03/31/2017 19:38    ____________________________________________   PROCEDURES .Central Line Date/Time: 03/31/2017 7:39 PM Performed by: Sharman Cheek, MD Authorized by: Sharman Cheek, MD   Consent:    Consent obtained:  Emergent situation Pre-procedure details:    Hand hygiene: Hand hygiene performed prior to insertion     Sterile barrier technique: All elements of maximal sterile technique followed     Skin preparation:  ChloraPrep   Skin preparation agent: Skin preparation agent completely dried prior to procedure   Anesthesia (see MAR for exact dosages):    Anesthesia method:  Local infiltration   Local anesthetic:  Lidocaine 1% w/o epi Procedure details:    Location:  R internal jugular   Patient position:  Trendelenburg   Procedural supplies:  Triple lumen   Landmarks identified: yes     Ultrasound guidance: yes     Sterile ultrasound techniques: Sterile gel and sterile probe covers were used     Number of attempts:  1   Successful placement: yes   Post-procedure details:    Post-procedure:  Dressing applied and line sutured   Assessment:  Blood return through all ports, free fluid flow, no pneumothorax on x-ray and placement verified by x-ray   Patient tolerance of procedure:  Tolerated well, no immediate complications   CRITICAL CARE Performed by: Scotty Court, Shaheen Star   Total critical care time: 35 minutes  Critical care time was exclusive of  separately billable procedures and treating other patients.  Critical care was necessary to treat or prevent imminent or life-threatening deterioration.  Critical care was time spent personally by me on the following activities: development of treatment plan with patient and/or surrogate as  well as nursing, discussions with consultants, evaluation of patient's response to treatment, examination of patient, obtaining history from patient or surrogate, ordering and performing treatments and interventions, ordering and review of laboratory studies, ordering and review of radiographic studies, pulse oximetry and re-evaluation of patient's condition.  ____________________________________________   DIFFERENTIAL DIAGNOSIS  Sepsis, UTI, pneumonia, notable derangement, severe dehydration  CLINICAL IMPRESSION / ASSESSMENT AND PLAN / ED COURSE  Pertinent labs & imaging results that were available during my care of the patient were reviewed by me and considered in my medical decision making (see chart for details).   Patient presents with cool mottled skin, hypotension consistent with distributive shock, likely due to sepsis. There was a delay in being able to initiate a full sepsis protocol due to difficulty obtaining adequate IV access and lab samples. This necessitated placing a central line in order to be able to obtain labs and provide adequate medications and fluids for resuscitation. Once this was achieved, sepsis protocol was initiated to proceed with usual critical care management.  Clinical Course as of Apr 01 2127  Tue Mar 31, 2017  1830 Still not able to obtain labs. Clnically vasocontricted with poor peripheral perfusion. Will need central venous sampling for labs. Hypotension in ED. Will need IVF bolus  [PS]  1850 Still hypotensive. Concern for impending circulatory collapse. Will place CVC emergently due to high risk of death imminently and likely need for vasopressors. No family  available, but pt reported to be full code by EMS who d/w family on scene  [PS]  1922 CVC complete. Will f/u cxr.   [PS]  1942 Cxr shows appropriate position of CVC tip. No ptx. Clear lungs.   [PS]  2058 Severe lactate elevation. D/w family, still indicate pt is full code, full scope of care. Elevated trop likely due to demand and shock state. Would not anticoagulate at this time.   [PS]  2059 Acute renal failure. Continue IVF bolus Creatinine: (!) 3.50 [PS]  2121 Still 80/40 manual bp after 2l IVF bolus. Will give 3rd liter, start levophed. No urine on cath UA attempt  [PS]    Clinical Course User Index [PS] Sharman Cheek, MD     ----------------------------------------- 9:28 PM on 03/31/2017 -----------------------------------------  Discussed with hospitalist for further management.  ____________________________________________   FINAL CLINICAL IMPRESSION(S) / ED DIAGNOSES    Final diagnoses:  Altered mental status, unspecified altered mental status type  Hypotension, unspecified hypotension type  Acute renal failure, unspecified acute renal failure type (HCC)  Shock (HCC)  Sepsis, due to unspecified organism [facility])      This SmartLink is deprecated. Use AVSMEDLIST instead to display the medication list for a patient.   Portions of this note were generated with dragon dictation software. Dictation errors may occur despite best attempts at proofreading.    Sharman Cheek, MD 03/31/17 2128    Sharman Cheek, MD 03/31/17 2157

## 2017-03-31 NOTE — Progress Notes (Addendum)
CODE SEPSIS - PHARMACY COMMUNICATION  **Broad Spectrum Antibiotics should be administered within 1 hour of Sepsis diagnosis**  Time Code Sepsis Called/Page Received: 19:21  Antibiotics Ordered: Zosyn and Vancomycin   Time of 1st antibiotic administration: 20:12   Additional action taken by pharmacy: called RN to inform about closing in on 1 hour window @ 20:02.   Patient requiring central line therefore antimicrobial therapy slightly delayed. RN to start Zosyn immediately.   If necessary, Name of Provider/Nurse Contacted: Iris, RN     Demetrius CharityJames,Spenser Harren D ,PharmD Clinical Pharmacist  03/31/2017  8:10 PM

## 2017-03-31 NOTE — ED Notes (Signed)
Pt brought in from home, pt's family's wishes is to keep patient full code at this time.  Pt's extremities are mottled, pt only responding to pain at this time.  Pt opens eyes only when in pain.  Pt has scattered bruising over body.  Per granddaughter who states that she is the POA, pt was walking on Saturday and eating pureed foods up until this point.  Per granddaughter, pt has not eaten in 2 days.  Pt is cachectic.  Per granddaughter pt has also been having frequent stool, some have been liquid and some have just been soft.

## 2017-03-31 NOTE — ED Notes (Signed)
Date and time results received: 03/31/17 2002 Test: Lactic Critical Value: 5.3  Test: Troponin Critical Value: 0.27  Test: Potassium Critical Value: 2.6  Name of Provider Notified: Dr. Scotty CourtStafford  Orders Received? Or Actions Taken?:  Acknowledged, no changes to orders at this time

## 2017-03-31 NOTE — Progress Notes (Signed)
eLink Physician-Brief Progress Note Patient Name: Rebecca ReddenBirtie B Webb DOB: 1929/03/26 MRN: [id number]   Date of Service  03/31/2017  HPI/Events of Note  83 yo female with PMH of dementia. Admitted with diarrhea X 4 days, dehydration and renal failure. Most likely has C. Difficile colitis. Now on Norepinephrine IV infusion for hemodynamic support. PCCM asked to assume care in ICU. Patient is now DNR/DNI. Continue present management.   eICU Interventions  No new orders.      Intervention Category Evaluation Type: New Patient Evaluation  Lenell AntuSommer,Steven Eugene 03/31/2017, 11:28 PM

## 2017-03-31 NOTE — ED Notes (Signed)
Attempted In & Out cath with no urine return at this time.

## 2017-03-31 NOTE — Progress Notes (Signed)
ANTIBIOTIC CONSULT NOTE - INITIAL  Pharmacy Consult for Vancomycin and Zosyn Indication: sepsis   Allergies  Allergen Reactions  . Lisinopril Swelling    Tongue and lips was swollen.  . Haldol [Haloperidol Lactate] Rash    Patient Measurements: Weight: 90 lb (40.8 kg) Adjusted Body Weight:   Vital Signs: Temp: 96.9 F (36.1 C) (12/11 1956) Temp Source: Rectal (12/11 1956) BP: 199/142 (12/11 1956) Pulse Rate: 67 (12/11 1956) Intake/Output from previous day: No intake/output data recorded. Intake/Output from this shift: No intake/output data recorded.  Labs: Recent Labs    03/31/17 1923  WBC 15.8*  HGB 13.6  PLT 227  CREATININE 3.50*   Estimated Creatinine Clearance: 7.4 mL/min (A) (by C-G formula based on SCr of 3.5 mg/dL (H)). No results for input(s): VANCOTROUGH, VANCOPEAK, VANCORANDOM, GENTTROUGH, GENTPEAK, GENTRANDOM, TOBRATROUGH, TOBRAPEAK, TOBRARND, AMIKACINPEAK, AMIKACINTROU, AMIKACIN in the last 72 hours.   Microbiology: No results found for this or any previous visit (from the past 720 hour(s)).  Medical History: Past Medical History:  Diagnosis Date  . Atrial fibrillation (HCC)   . Chronic kidney disease   . Dementia   . Dementia   . Depression   . GERD (gastroesophageal reflux disease)   . Hyperlipemia   . Hypertension   . Vitamin D deficiency     Medications:   (Not in a hospital admission) Scheduled:   Assessment: Pharmacy to dose and monitor Vancomycin in this critically ill 83 year old woman admitted with septic like picture. Code sepsis initiated   Goal of Therapy:  Vancomycin trough level 15-20 mcg/ml  Plan:  Patient has order for  Vancomycin 1 g IV x 1 in ER. Based on patient being in severe AKI, will not require another dose of Vancomycin for at least 65 hours post initial dose unless renal function improves. Pharmacy will follow renal function closely and adjust timing of subsequent dosing based on results. Likely will be dosed  per levels.   Zosyn: Patient received Zosyn 3.375 g IV x 1 in ED. Will start Zosyn 3.375 g IV q12 hours thereafter.    Rebecca Webb D 03/31/2017,8:21 PM

## 2017-03-31 NOTE — Progress Notes (Signed)
  Discussed Code Status with Janie MorningLisa McAdoo( Grand-daughter) HPOA of Rebecca Webb .  She has agreed to make the patient a DNR as she understands that her prognosis is guarded and would not be able to make through a "Code situation".  Bincy Varughese,AG-ACNP Pulmonary & Critical Care

## 2017-04-01 ENCOUNTER — Inpatient Hospital Stay
Admit: 2017-04-01 | Discharge: 2017-04-01 | Disposition: A | Discharge status: Home / Self Care | Payer: Medicare Other | Attending: Internal Medicine | Admitting: Internal Medicine

## 2017-04-01 ENCOUNTER — Inpatient Hospital Stay: Payer: Medicare Other

## 2017-04-01 DIAGNOSIS — A0472 Enterocolitis due to Clostridium difficile, not specified as recurrent: Secondary | ICD-10-CM | POA: Diagnosis present

## 2017-04-01 LAB — GASTROINTESTINAL PANEL BY PCR, STOOL (REPLACES STOOL CULTURE)
ADENOVIRUS F40/41: NOT DETECTED
Astrovirus: NOT DETECTED
CAMPYLOBACTER SPECIES: NOT DETECTED
CRYPTOSPORIDIUM: NOT DETECTED
CYCLOSPORA CAYETANENSIS: NOT DETECTED
ENTAMOEBA HISTOLYTICA: NOT DETECTED
Enteroaggregative E coli (EAEC): NOT DETECTED
Enteropathogenic E coli (EPEC): DETECTED — AB
Enterotoxigenic E coli (ETEC): NOT DETECTED
Giardia lamblia: NOT DETECTED
Norovirus GI/GII: NOT DETECTED
PLESIMONAS SHIGELLOIDES: NOT DETECTED
Rotavirus A: NOT DETECTED
SAPOVIRUS (I, II, IV, AND V): NOT DETECTED
SHIGA LIKE TOXIN PRODUCING E COLI (STEC): NOT DETECTED
Salmonella species: NOT DETECTED
Shigella/Enteroinvasive E coli (EIEC): NOT DETECTED
VIBRIO CHOLERAE: NOT DETECTED
VIBRIO SPECIES: NOT DETECTED
YERSINIA ENTEROCOLITICA: NOT DETECTED

## 2017-04-01 LAB — GLUCOSE, CAPILLARY
Glucose-Capillary: 178 mg/dL — ABNORMAL HIGH (ref 65–99)
Glucose-Capillary: 91 mg/dL (ref 65–99)

## 2017-04-01 LAB — BASIC METABOLIC PANEL
ANION GAP: 12 (ref 5–15)
BUN: 63 mg/dL — AB (ref 6–20)
CALCIUM: 7.2 mg/dL — AB (ref 8.9–10.3)
CO2: 22 mmol/L (ref 22–32)
CREATININE: 2.89 mg/dL — AB (ref 0.44–1.00)
Chloride: 117 mmol/L — ABNORMAL HIGH (ref 101–111)
GFR calc Af Amer: 16 mL/min — ABNORMAL LOW (ref >60)
GFR, EST NON AFRICAN AMERICAN: 14 mL/min — AB (ref >60)
GLUCOSE: 208 mg/dL — AB (ref 65–99)
Potassium: 2.9 mmol/L — ABNORMAL LOW (ref 3.5–5.1)
Sodium: 151 mmol/L — ABNORMAL HIGH (ref 135–145)

## 2017-04-01 LAB — POTASSIUM
POTASSIUM: 2.8 mmol/L — AB (ref 3.5–5.1)
POTASSIUM: 2.9 mmol/L — AB (ref 3.5–5.1)

## 2017-04-01 LAB — CBC
HCT: 37.7 % (ref 35.0–47.0)
Hemoglobin: 12.1 g/dL (ref 12.0–16.0)
MCH: 29.9 pg (ref 26.0–34.0)
MCHC: 32.2 g/dL (ref 32.0–36.0)
MCV: 93 fL (ref 80.0–100.0)
PLATELETS: 199 10*3/uL (ref 150–440)
RBC: 4.05 MIL/uL (ref 3.80–5.20)
RDW: 16.9 % — ABNORMAL HIGH (ref 11.5–14.5)
WBC: 18.7 10*3/uL — ABNORMAL HIGH (ref 3.6–11.0)

## 2017-04-01 LAB — LACTIC ACID, PLASMA: LACTIC ACID, VENOUS: 1.5 mmol/L (ref 0.5–1.9)

## 2017-04-01 LAB — PHOSPHORUS: PHOSPHORUS: 3.1 mg/dL (ref 2.5–4.6)

## 2017-04-01 LAB — TROPONIN I
TROPONIN I: 0.58 ng/mL — AB (ref <0.03)
TROPONIN I: 0.63 ng/mL — AB (ref <0.03)

## 2017-04-01 LAB — MAGNESIUM: Magnesium: 1.9 mg/dL (ref 1.7–2.4)

## 2017-04-01 MED ORDER — SODIUM CHLORIDE 0.9 % IV BOLUS (SEPSIS)
1000.0000 mL | Freq: Once | INTRAVENOUS | Status: AC
Start: 2017-04-01 — End: 2017-04-01
  Administered 2017-04-01: 1000 mL via INTRAVENOUS

## 2017-04-01 MED ORDER — VANCOMYCIN 50 MG/ML ORAL SOLUTION
500.0000 mg | Freq: Four times a day (QID) | ORAL | Status: DC
  Filled 2017-04-01 (×2): qty 10

## 2017-04-01 MED ORDER — OSMOLITE 1.2 CAL PO LIQD: 1000.0000 mL | ORAL | Status: DC

## 2017-04-01 MED ORDER — DEXTROSE 5 % IV SOLN: INTRAVENOUS | Status: DC

## 2017-04-01 MED ORDER — NOREPINEPHRINE BITARTRATE 1 MG/ML IV SOLN
0.0000 ug/min | Freq: Once | INTRAVENOUS | Status: AC
  Administered 2017-04-01: 5 ug/min via INTRAVENOUS
  Filled 2017-04-01: qty 4

## 2017-04-01 MED ORDER — METRONIDAZOLE IN NACL 5-0.79 MG/ML-% IV SOLN
500.0000 mg | Freq: Three times a day (TID) | INTRAVENOUS | Status: AC
  Administered 2017-04-01 – 2017-04-11 (×29): 500 mg via INTRAVENOUS
  Filled 2017-04-01 (×31): qty 100

## 2017-04-01 MED ORDER — POTASSIUM CHLORIDE 10 MEQ/100ML IV SOLN
10.0000 meq | INTRAVENOUS | Status: AC
  Administered 2017-04-01 (×4): 10 meq via INTRAVENOUS
  Filled 2017-04-01 (×4): qty 100

## 2017-04-01 MED ORDER — NOREPINEPHRINE BITARTRATE 1 MG/ML IV SOLN
0.0000 ug/min | INTRAVENOUS | Status: DC
  Filled 2017-04-01: qty 4

## 2017-04-01 MED ORDER — NOREPINEPHRINE BITARTRATE 1 MG/ML IV SOLN
0.0000 ug/min | INTRAVENOUS | Status: DC
  Administered 2017-04-02: 4 ug/min via INTRAVENOUS
  Filled 2017-04-01: qty 4

## 2017-04-01 MED ORDER — MORPHINE SULFATE (PF) 2 MG/ML IV SOLN
INTRAVENOUS | Status: AC
  Filled 2017-04-01: qty 1

## 2017-04-01 MED ORDER — DEXTROSE 50 % IV SOLN
INTRAVENOUS | Status: AC
  Administered 2017-04-02: 50 mL via INTRAVENOUS
  Filled 2017-04-01: qty 50

## 2017-04-01 MED ORDER — MAGNESIUM SULFATE 2 GM/50ML IV SOLN
2.0000 g | Freq: Once | INTRAVENOUS | Status: AC
  Administered 2017-04-01: 2 g via INTRAVENOUS
  Filled 2017-04-01: qty 50

## 2017-04-01 MED ORDER — SODIUM CHLORIDE 0.9 % IV SOLN
INTRAVENOUS | Status: DC
  Administered 2017-04-01 (×2): via INTRAVENOUS

## 2017-04-01 MED ORDER — POTASSIUM CHLORIDE 10 MEQ/50ML IV SOLN
10.0000 meq | INTRAVENOUS | Status: AC
  Administered 2017-04-01 (×6): 10 meq via INTRAVENOUS
  Filled 2017-04-01 (×6): qty 50

## 2017-04-01 MED ORDER — JEVITY 1.2 CAL PO LIQD: 1000.0000 mL | ORAL | Status: DC

## 2017-04-01 MED ORDER — VANCOMYCIN HCL 500 MG IV SOLR
500.0000 mg | Freq: Four times a day (QID) | Status: DC
  Administered 2017-04-01 – 2017-04-08 (×27): 500 mg via RECTAL
  Filled 2017-04-01 (×32): qty 500

## 2017-04-01 MED ORDER — DEXTROSE 50 % IV SOLN
1.0000 | Freq: Once | INTRAVENOUS | Status: AC
  Administered 2017-04-02: 50 mL via INTRAVENOUS

## 2017-04-01 MED ORDER — VANCOMYCIN 50 MG/ML ORAL SOLUTION
500.0000 mg | Freq: Four times a day (QID) | ORAL | Status: DC
  Filled 2017-04-01: qty 10

## 2017-04-01 NOTE — Progress Notes (Signed)
Pharmacy Electrolyte Monitoring Consult:  Pharmacy consulted to assist in monitoring and replacing electrolytes in this 83 y.o. female with a h/o atrial fibrillation  admitted on 03/31/2017 with Altered Mental Status Patient with C diff.  12/12: Unable to place Dobhoff, IR consulted for tube placement.   Labs:  Sodium (mmol/L)  Date Value  04/01/2017 151 (H)   Potassium (mmol/L)  Date Value  04/01/2017 2.9 (L)   Magnesium (mg/dL)  Date Value  44/01/027212/03/2017 1.9   Phosphorus (mg/dL)  Date Value  53/66/440312/03/2017 3.1   Calcium (mg/dL)  Date Value  47/42/595612/03/2017 7.2 (L)   Albumin (g/dL)  Date Value  38/75/643312/02/2017 2.5 (L)    Plan: Magnesium sulfate 2 g iv once. Unable to replace K orally currently. Will order KCl 10 meq IV x 6. Will f/u AM labs.   Rebecca Webb, Rebecca Webb 04/01/2017 4:23 PM

## 2017-04-01 NOTE — Progress Notes (Signed)
*  PRELIMINARY RESULTS* Echocardiogram 2D Echocardiogram has been performed.  Cristela BlueHege, Violet Cart 04/01/2017, 3:40 PM

## 2017-04-01 NOTE — Progress Notes (Signed)
3 RNs made 5 different attempts to insert dobhoff without success. Order put in for dobhoff to be put in in IR under flouro. Pt taken to flouro, dobhoff unable to be put in by radiologist. NP and MD notified, no new orders at this time.

## 2017-04-01 NOTE — Progress Notes (Signed)
Initial Nutrition Assessment  DOCUMENTATION CODES:   Severe malnutrition in context of chronic illness, Underweight  INTERVENTION:  Once NGT placed and confirmed recommend initiating Osmolite 1.2 at 20 mL/hr and monitoring electrolytes closely. On 12/13 if electrolytes and CBGs are within acceptable range can advance to goal regimen of Osmolite 1.2 at 45 mL/hr. Provides 1296 kcal, 60 grams of protein, 886 mL H2O daily.  Monitor magnesium, potassium, and phosphorus daily for at least 3 days, MD to replete as needed, as pt is at risk for refeeding syndrome given severe chronic malnutrition.  NUTRITION DIAGNOSIS:   Severe Malnutrition related to chronic illness(CKD, diarrhea) as evidenced by severe fat depletion, severe muscle depletion.  GOAL:   Patient will meet greater than or equal to 90% of their needs  MONITOR:   Diet advancement, Labs, Weight trends, Skin, I & O's  REASON FOR ASSESSMENT:   Malnutrition Screening Tool    ASSESSMENT:   83 year old female with PMHx of dementia, HTN, GERD, depression, A-fib, HLD, vitamin D deficiency, CKD, recent hip fracture s/p left intertrochanteric IM nail 02/11/2017 now admitted with sepsis, AKI, C. Difficile diarrhea.   Patient unable to provide history in setting of dementia. She was resting in bed with eyes closed. No family members present at time of RD assessment. Patient has a rectal tube in place.  Per chart patient was 122.5 lbs on 02/19/2015. Weight from 02/08/2017 likely inaccurate.  Medications reviewed and include: vancomycin, NS @ 150 mL/hr, Flagyl.  Labs reviewed: CBG 111-178, Sodium 151, Potassium 2.9, Chloride 117, BUN 63, Creatinine 2.89. Phosphorus and Magnesium WNL.  Discussed with RN. Patient also discussed on rounds. Patient lives with daughter at home. She has not been eating or drinking for several days. Unsafe for PO intake at this time. Possible plan for placement of Dobbhoff tube for nutrition and antibiotics  after discussion with family regarding goals of care.  NUTRITION - FOCUSED PHYSICAL EXAM:    Most Recent Value  Orbital Region  Severe depletion  Upper Arm Region  Severe depletion  Thoracic and Lumbar Region  Unable to assess  Buccal Region  Severe depletion  Temple Region  Severe depletion  Clavicle Bone Region  Severe depletion  Clavicle and Acromion Bone Region  Unable to assess  Scapular Bone Region  Severe depletion  Dorsal Hand  Unable to assess  Patellar Region  Severe depletion  Anterior Thigh Region  Severe depletion  Posterior Calf Region  Severe depletion  Edema (RD Assessment)  None  Hair  Reviewed  Eyes  Unable to assess  Mouth  Unable to assess  Skin  Reviewed  Nails  Unable to assess     Diet Order:  Diet NPO time specified  EDUCATION NEEDS:   Not appropriate for education at this time  Skin:  Skin Assessment: Reviewed RN Assessment(ecchymosis to head)  Last BM:  12/12 - smear type 7 rectal tube  Height:   Ht Readings from Last 1 Encounters:  04/01/17 5' (1.524 m)    Weight:   Wt Readings from Last 1 Encounters:  04/01/17 88 lb 11.4 oz (40.2 kg)    Ideal Body Weight:  45.5 kg  BMI:  Body mass index is 17.33 kg/m.  Estimated Nutritional Needs:   Kcal:  1205-1405 (30-35 kcal/kg)  Protein:  60-70 grams (1.5-1.7 grams/kg)  Fluid:  1.2-1.4 L/day (1 mL/kcal)  Helane RimaLeanne Skylinn Vialpando, MS, RD, LDN Office: [phone] Pager: [phone] After Hours/Weekend Pager: [phone]

## 2017-04-01 NOTE — Progress Notes (Signed)
[facility] [place] at [facility]   PATIENT NAME: Rebecca FlightBirtie Cassaro    MR#:  [id number]  DATE OF BIRTH:  [birth year]  SUBJECTIVE: Patient admitted for septic shock secondary to C. difficile colitis.  In  ICU on pressors.  CHIEF COMPLAINT:   Chief Complaint  Patient presents with  . Altered Mental Status    REVIEW OF SYSTEMS:    Review of Systems  Unable to perform ROS: Dementia    Nutrition: Poor p.o. intake Tolerating Diet: Tolerating PT:      DRUG ALLERGIES:   Allergies  Allergen Reactions  . Lisinopril Swelling    Tongue and lips was swollen.  . Haldol [Haloperidol Lactate] Rash    VITALS:  Blood pressure (!) 107/55, pulse (!) 59, temperature (!) 97.1 F (36.2 C), temperature source Axillary, resp. rate 11, height 5' (1.524 m), weight 40.2 kg (88 lb 11.4 oz), SpO2 100 %.  PHYSICAL EXAMINATION:   Physical Exam  GENERAL:  83 y.o.-year-old patient lying in the bed .  Appears cachectic. EYES: Pupils equal, round, reactive to light . No scleral icterus. Extraocular muscles intact.  HEENT: Head atraumatic, normocephalic. Oropharynx and nasopharynx clear.  NECK:  Supple, no jugular venous distention. No thyroid enlargement, no tenderness.  LUNGS: Normal breath sounds bilaterally, no wheezing, rales,rhonchi or crepitation. No use of accessory muscles of respiration.  CARDIOVASCULAR: S1, S2 normal. No murmurs, rubs, or gallops.  ABDOMEN: Soft, nontender, nondistended. Bowel sounds present. No organomegaly or mass.  EXTREMITIES: No pedal edema, cyanosis, or clubbing.  NEUROLOGIC unable to do  neurological exam because of her dementia.  PSYCHIATRIC: Patient is demented.   SKIN: No obvious rash, lesion, or ulcer.    LABORATORY PANEL:   CBC Recent Labs  Lab 04/01/17 0421  WBC 18.7*  HGB 12.1  HCT 37.7  PLT 199   ------------------------------------------------------------------------------------------------------------------  Chemistries   Recent Labs  Lab 03/31/17 1923 04/01/17 0421 04/01/17 1030 04/01/17 1312  NA 158* 151*  --   --   K 2.6* 2.9*  --  2.8*  CL 118* 117*  --   --   CO2 21* 22  --   --   GLUCOSE 115* 208*  --   --   BUN 73* 63*  --   --   CREATININE 3.50* 2.89*  --   --   CALCIUM 8.3* 7.2*  --   --   MG  --   --  1.9  --   AST 67*  --   --   --   ALT 33  --   --   --   ALKPHOS 141*  --   --   --   BILITOT 1.0  --   --   --    ------------------------------------------------------------------------------------------------------------------  Cardiac Enzymes Recent Labs  Lab 04/01/17 0950  TROPONINI 0.63*   ------------------------------------------------------------------------------------------------------------------  RADIOLOGY:  Dg Chest Port 1 View  Result Date: 03/31/2017 CLINICAL DATA:  Altered mental status. EXAM: PORTABLE CHEST 1 VIEW COMPARISON:  Single-view of the chest 02/05/2017. FINDINGS: New right IJ approach central venous catheter tip projects in the right atrium. The catheter should be withdrawn approximately 6 cm. No pneumothorax. Lungs are clear. Heart size is normal. No pleural fluid. No acute bony abnormality. IMPRESSION: Right IJ catheter tip projects within the right atrium. Recommend withdrawal of 6 cm. No pneumothorax. Lungs clear. Electronically Signed   By: Drusilla Kannerhomas  Dalessio M.D.   On: 03/31/2017 19:38     ASSESSMENT AND PLAN:  Principal Problem:   Sepsis (HCC) Active Problems:   A-fib (HCC)   Alzheimer's dementia   Benign essential hypertension   AKI (acute kidney injury) (HCC)   Elevated troponin   C. difficile diarrhea   #1 sepsis present on admission secondary to C. difficile colitis.  Patient is in septic shock so she is in ICU getting IV fluids, pressors, antibiotics with Flagyl, p.o. vancomycin.  2.  Hyponatremia, hypokalemia, acute renal failure secondary to septic shock: Patient is getting potassium replacement and also she is on normal saline, and  IV fluids to D5 water because of hyponatremia, prognosis poor due to advanced age, colitis, dementia CODE STATUS DNR, Discussed with patient's daughters at bedside.  3.. Septic shock ;stopped metoprolol, Cardizem.     All the records are reviewed and case discussed with Care Management/Social Workerr. Management plans discussed with the patient, family and they are in agreement.  CODE STATUS: DNR  TOTAL TIME TAKING CARE OF THIS PATIENT: 35minutes.   Very sick unable to plan for discharge disposition.  Katha HammingSnehalatha Abbas Beyene M.D on 04/01/2017 at 1:51 PM  Between 7am to 6pm - Pager - [phone]  After 6pm go to [email or web address] - password [email or web address] [facility]  [facility]  Office  [phone]  CC: Primary care physician; Leotis ShamesSingh, Jasmine, MD

## 2017-04-02 ENCOUNTER — Inpatient Hospital Stay: Payer: Medicare Other

## 2017-04-02 DIAGNOSIS — R933 Abnormal findings on diagnostic imaging of other parts of digestive tract: Secondary | ICD-10-CM

## 2017-04-02 LAB — BASIC METABOLIC PANEL
ANION GAP: 7 (ref 5–15)
ANION GAP: 6 (ref 5–15)
Anion gap: 7 (ref 5–15)
BUN: 35 mg/dL — ABNORMAL HIGH (ref 6–20)
BUN: 28 mg/dL — AB (ref 6–20)
BUN: 23 mg/dL — AB (ref 6–20)
CALCIUM: 6.9 mg/dL — AB (ref 8.9–10.3)
CALCIUM: 7.2 mg/dL — AB (ref 8.9–10.3)
CHLORIDE: 123 mmol/L — AB (ref 101–111)
CO2: 21 mmol/L — AB (ref 22–32)
CO2: 21 mmol/L — AB (ref 22–32)
CO2: 22 mmol/L (ref 22–32)
CREATININE: 1.11 mg/dL — AB (ref 0.44–1.00)
Calcium: 7.4 mg/dL — ABNORMAL LOW (ref 8.9–10.3)
Chloride: 120 mmol/L — ABNORMAL HIGH (ref 101–111)
Chloride: 119 mmol/L — ABNORMAL HIGH (ref 101–111)
Creatinine, Ser: 1.63 mg/dL — ABNORMAL HIGH (ref 0.44–1.00)
Creatinine, Ser: 1.22 mg/dL — ABNORMAL HIGH (ref 0.44–1.00)
GFR calc Af Amer: 45 mL/min — ABNORMAL LOW (ref >60)
GFR calc Af Amer: 51 mL/min — ABNORMAL LOW (ref >60)
GFR calc non Af Amer: 27 mL/min — ABNORMAL LOW (ref >60)
GFR calc non Af Amer: 39 mL/min — ABNORMAL LOW (ref >60)
GFR calc non Af Amer: 44 mL/min — ABNORMAL LOW (ref >60)
GFR, EST AFRICAN AMERICAN: 32 mL/min — AB (ref >60)
GLUCOSE: 113 mg/dL — AB (ref 65–99)
GLUCOSE: 81 mg/dL (ref 65–99)
Glucose, Bld: 86 mg/dL (ref 65–99)
POTASSIUM: 3.4 mmol/L — AB (ref 3.5–5.1)
Potassium: 2.9 mmol/L — ABNORMAL LOW (ref 3.5–5.1)
Potassium: 2.9 mmol/L — ABNORMAL LOW (ref 3.5–5.1)
SODIUM: 151 mmol/L — AB (ref 135–145)
Sodium: 147 mmol/L — ABNORMAL HIGH (ref 135–145)
Sodium: 148 mmol/L — ABNORMAL HIGH (ref 135–145)

## 2017-04-02 LAB — CBC
HCT: 32.5 % — ABNORMAL LOW (ref 35.0–47.0)
HEMOGLOBIN: 10.6 g/dL — AB (ref 12.0–16.0)
MCH: 30.1 pg (ref 26.0–34.0)
MCHC: 32.6 g/dL (ref 32.0–36.0)
MCV: 92.3 fL (ref 80.0–100.0)
PLATELETS: 150 10*3/uL (ref 150–440)
RBC: 3.52 MIL/uL — AB (ref 3.80–5.20)
RDW: 16.7 % — ABNORMAL HIGH (ref 11.5–14.5)
WBC: 11.4 10*3/uL — ABNORMAL HIGH (ref 3.6–11.0)

## 2017-04-02 LAB — HEMOGLOBIN AND HEMATOCRIT, BLOOD
HCT: 32.5 % — ABNORMAL LOW (ref 35.0–47.0)
Hemoglobin: 10.6 g/dL — ABNORMAL LOW (ref 12.0–16.0)

## 2017-04-02 LAB — ECHOCARDIOGRAM COMPLETE
Height: 60 in
Weight: 1419.4097 oz

## 2017-04-02 LAB — GLUCOSE, CAPILLARY
GLUCOSE-CAPILLARY: 55 mg/dL — AB (ref 65–99)
GLUCOSE-CAPILLARY: 60 mg/dL — AB (ref 65–99)
GLUCOSE-CAPILLARY: 98 mg/dL (ref 65–99)
Glucose-Capillary: 136 mg/dL — ABNORMAL HIGH (ref 65–99)
Glucose-Capillary: 143 mg/dL — ABNORMAL HIGH (ref 65–99)
Glucose-Capillary: 105 mg/dL — ABNORMAL HIGH (ref 65–99)
Glucose-Capillary: 79 mg/dL (ref 65–99)

## 2017-04-02 LAB — TROPONIN I: Troponin I: 0.24 ng/mL (ref <0.03)

## 2017-04-02 LAB — MAGNESIUM: MAGNESIUM: 2 mg/dL (ref 1.7–2.4)

## 2017-04-02 MED ORDER — ATROPINE SULFATE 1 MG/10ML IJ SOSY
PREFILLED_SYRINGE | INTRAMUSCULAR | Status: AC
  Administered 2017-04-02: 0.5 mg via INTRAVENOUS
  Filled 2017-04-02: qty 10

## 2017-04-02 MED ORDER — DEXTROSE 50 % IV SOLN
INTRAVENOUS | Status: AC
  Administered 2017-04-02: 50 mL via INTRAVENOUS
  Filled 2017-04-02: qty 50

## 2017-04-02 MED ORDER — SODIUM CHLORIDE 0.9 % IV BOLUS (SEPSIS)
1000.0000 mL | Freq: Once | INTRAVENOUS | Status: AC
  Administered 2017-04-02: 1000 mL via INTRAVENOUS

## 2017-04-02 MED ORDER — AMIODARONE LOAD VIA INFUSION
150.0000 mg | Freq: Once | INTRAVENOUS | Status: AC
  Administered 2017-04-02: 150 mg via INTRAVENOUS
  Filled 2017-04-02: qty 83.34

## 2017-04-02 MED ORDER — ATROPINE SULFATE 1 MG/10ML IJ SOSY
0.5000 mg | PREFILLED_SYRINGE | Freq: Once | INTRAMUSCULAR | Status: AC
  Administered 2017-04-02: 0.5 mg via INTRAVENOUS

## 2017-04-02 MED ORDER — SODIUM CHLORIDE 0.9 % IV SOLN
0.0000 ug/min | INTRAVENOUS | Status: DC
  Administered 2017-04-02: 40 ug/min via INTRAVENOUS
  Administered 2017-04-02: 20 ug/min via INTRAVENOUS
  Administered 2017-04-03: 40 ug/min via INTRAVENOUS
  Filled 2017-04-02 (×3): qty 1
  Filled 2017-04-02 (×3): qty 10
  Filled 2017-04-02: qty 1

## 2017-04-02 MED ORDER — PANTOPRAZOLE SODIUM 40 MG IV SOLR
40.0000 mg | INTRAVENOUS | Status: DC
  Administered 2017-04-02 – 2017-04-04 (×3): 40 mg via INTRAVENOUS
  Filled 2017-04-02 (×3): qty 40

## 2017-04-02 MED ORDER — MORPHINE SULFATE (PF) 2 MG/ML IV SOLN
INTRAVENOUS | Status: AC
  Administered 2017-04-02: 2 mg via INTRAVENOUS
  Filled 2017-04-02: qty 1

## 2017-04-02 MED ORDER — DEXTROSE 5 % IV SOLN
INTRAVENOUS | Status: DC
  Administered 2017-04-02 – 2017-04-04 (×2): via INTRAVENOUS

## 2017-04-02 MED ORDER — AMIODARONE HCL IN DEXTROSE 360-4.14 MG/200ML-% IV SOLN
30.0000 mg/h | INTRAVENOUS | Status: DC
  Administered 2017-04-02: 15 mg/h via INTRAVENOUS
  Filled 2017-04-02: qty 200

## 2017-04-02 MED ORDER — POTASSIUM CHLORIDE 10 MEQ/100ML IV SOLN
10.0000 meq | INTRAVENOUS | Status: DC
  Filled 2017-04-02 (×6): qty 100

## 2017-04-02 MED ORDER — POTASSIUM CHLORIDE 10 MEQ/100ML IV SOLN
10.0000 meq | INTRAVENOUS | Status: AC
  Administered 2017-04-02 (×3): 10 meq via INTRAVENOUS
  Filled 2017-04-02 (×3): qty 100

## 2017-04-02 MED ORDER — INSULIN ASPART 100 UNIT/ML ~~LOC~~ SOLN
0.0000 [IU] | SUBCUTANEOUS | Status: DC
  Administered 2017-04-03 – 2017-04-05 (×5): 1 [IU] via SUBCUTANEOUS
  Filled 2017-04-02 (×5): qty 1

## 2017-04-02 MED ORDER — DEXTROSE 50 % IV SOLN
1.0000 | Freq: Once | INTRAVENOUS | Status: AC
  Administered 2017-04-02: 50 mL via INTRAVENOUS

## 2017-04-02 MED ORDER — MORPHINE SULFATE (PF) 2 MG/ML IV SOLN
2.0000 mg | Freq: Once | INTRAVENOUS | Status: AC
  Administered 2017-04-02: 2 mg via INTRAVENOUS

## 2017-04-02 MED ORDER — DEXTROSE-NACL 5-0.45 % IV SOLN
INTRAVENOUS | Status: DC
  Administered 2017-04-02: 04:00:00 via INTRAVENOUS

## 2017-04-02 MED ORDER — POTASSIUM CHLORIDE 10 MEQ/50ML IV SOLN
10.0000 meq | INTRAVENOUS | Status: AC
  Administered 2017-04-02 – 2017-04-03 (×6): 10 meq via INTRAVENOUS
  Filled 2017-04-02 (×6): qty 50

## 2017-04-02 NOTE — Progress Notes (Signed)
Pharmacy Electrolyte Monitoring Consult:  Pharmacy consulted to assist in monitoring and replacing electrolytes in this 83 y.o. female with a h/o atrial fibrillation  admitted on 03/31/2017 with Altered Mental Status Patient with C diff.  12/12: Unable to place Dobhoff, IR consulted for tube placement.   Labs:  Sodium (mmol/L)  Date Value  04/02/2017 148 (H)   Potassium (mmol/L)  Date Value  04/02/2017 2.9 (L)   Magnesium (mg/dL)  Date Value  78/29/562112/13/2018 2.0   Phosphorus (mg/dL)  Date Value  30/86/578412/03/2017 3.1   Calcium (mg/dL)  Date Value  69/62/952812/13/2018 7.2 (L)   Albumin (g/dL)  Date Value  41/32/440112/02/2017 2.5 (L)    Plan: Potassium is still 2.9. Patient has received  KCL 50mEq IV so far. Spoke with nurse, patient still have  KCl 10 meq IV x 4  left to infuse. Nurse will order F/U K+ lab once potassium infusions are complete and will order additional replacement if needed.   Will recheck all electrolytes with AM labs.    Gardner CandleSheema M Mallarie Voorhies, PharmD, BCPS Clinical Pharmacist 04/02/2017 6:57 PM

## 2017-04-02 NOTE — Progress Notes (Signed)
[facility] [place] at [facility]   PATIENT NAME: Rebecca FlightBirtie Webb    MR#:  [id number]  DATE OF BIRTH:  1928-12-17  SUBJECTIVE: Admitted for septic shock .  Hypothermia today.  CHIEF COMPLAINT:   Chief Complaint  Patient presents with  . Altered Mental Status    REVIEW OF SYSTEMS:    Review of Systems  Unable to perform ROS: Dementia    Nutrition: Poor p.o. intake Tolerating Diet: Tolerating PT:      DRUG ALLERGIES:   Allergies  Allergen Reactions  . Lisinopril Swelling    Tongue and lips was swollen.  . Haldol [Haloperidol Lactate] Rash    VITALS:  Blood pressure (!) 79/54, pulse 64, temperature 97.9 F (36.6 C), temperature source Oral, resp. rate 15, height 5' (1.524 m), weight 42.7 kg (94 lb 2.2 oz), SpO2 99 %.  PHYSICAL EXAMINATION:   Physical Exam  GENERAL:  83 y.o.-year-old patient lying in the bed .  Appears cachectic. EYES: Pupils equal, round, reactive to light . No scleral icterus. Extraocular muscles intact.  HEENT: Head atraumatic, normocephalic. Oropharynx and nasopharynx clear.  NECK:  Supple, no jugular venous distention. No thyroid enlargement, no tenderness.  LUNGS: Normal breath sounds bilaterally, no wheezing, rales,rhonchi or crepitation. No use of accessory muscles of respiration.  CARDIOVASCULAR: S1, S2 normal. No murmurs, rubs, or gallops.  ABDOMEN: Soft, nontender, nondistended. Bowel sounds present. No organomegaly or mass.  EXTREMITIES: No pedal edema, cyanosis, or clubbing.  NEUROLOGIC unable to do  neurological exam because of her dementia.  PSYCHIATRIC: Patient is demented.   SKIN: No obvious rash, lesion, or ulcer.    LABORATORY PANEL:   CBC Recent Labs  Lab 04/02/17 1145  WBC 11.4*  HGB 10.6*  HCT 32.5*  PLT 150   ------------------------------------------------------------------------------------------------------------------  Chemistries  Recent Labs  Lab 03/31/17 1923  04/02/17 0350  04/02/17 1145  NA 158*   < > 151* 147*  K 2.6*   < > 3.4* 2.9*  CL 118*   < > 123* 120*  CO2 21*   < > 21* 21*  GLUCOSE 115*   < > 86 113*  BUN 73*   < > 35* 28*  CREATININE 3.50*   < > 1.63* 1.22*  CALCIUM 8.3*   < > 7.4* 6.9*  MG  --    < > 2.0  --   AST 67*  --   --   --   ALT 33  --   --   --   ALKPHOS 141*  --   --   --   BILITOT 1.0  --   --   --    < > = values in this interval not displayed.   ------------------------------------------------------------------------------------------------------------------  Cardiac Enzymes Recent Labs  Lab 04/02/17 1145  TROPONINI 0.24*   ------------------------------------------------------------------------------------------------------------------  RADIOLOGY:  Dg Chest Port 1 View  Result Date: 03/31/2017 CLINICAL DATA:  Altered mental status. EXAM: PORTABLE CHEST 1 VIEW COMPARISON:  Single-view of the chest 02/05/2017. FINDINGS: New right IJ approach central venous catheter tip projects in the right atrium. The catheter should be withdrawn approximately 6 cm. No pneumothorax. Lungs are clear. Heart size is normal. No pleural fluid. No acute bony abnormality. IMPRESSION: Right IJ catheter tip projects within the right atrium. Recommend withdrawal of 6 cm. No pneumothorax. Lungs clear. Electronically Signed   By: Drusilla Kannerhomas  Dalessio M.D.   On: 03/31/2017 19:38   Dg Vangie BickerNaso G Tube Plc W/fl W/rad  Result Date: 04/01/2017  CLINICAL DATA:  Decreased oral intake, mental status change, weight loss. EXAM: NASO G TUBE PLACEMENT WITH FL AND WITH RAD CONTRAST:  20 cc of dilute thin barium. FLUOROSCOPY TIME:  Fluoroscopy Time:  2 minutes, 36 seconds Radiation Exposure Index (if provided by the fluoroscopic device): 620 micro Gy per meters square Number of Acquired Spot Images: 1 screen save COMPARISON:  None in PACs FINDINGS: The patient was on contact isolation and the 2 Radiology technologist and myself were appropriately attired for this level of  isolation. The patient was not alert or oriented during the study. The Dobhoff tube and guidewire were hydrated and the tip lubricated with lubricating jelly. The tube was passed through the left nostril without difficulty and into the esophagus without difficulty. However in the distal esophagus the tube re-curved upon itself and would not pass further. A small amount of dilute barium was then administered which revealed a markedly narrowed distal esophagus with only tiny amounts of the barium passing into the stomach. The tube tip could not be directed through this area of narrowing. The tube was then removed and the procedure terminated. IMPRESSION: Distal esophageal stricture exhibiting irregularity and preventing passage of the Dobhoff tube into the stomach. The findings are worrisome for distal esophageal malignancy though severe inflammation could produce similar findings given the history of gastroesophageal reflux. Direct visualization is recommended to further evaluate the distal esophagus. Electronically Signed   By: David  SwazilandJordan M.D.   On: 04/01/2017 17:22     ASSESSMENT AND PLAN:   Principal Problem:   Sepsis (HCC) Active Problems:   A-fib (HCC)   Alzheimer's dementia   Benign essential hypertension   AKI (acute kidney injury) (HCC)   Elevated troponin   C. difficile diarrhea   #1 sepsis present on admission secondary to C. difficile colitis.  Patient is in septic shock so she is in ICU getting IV fluids, pressors, antibiotics with Flagyl, vancomycin and Lemus. 2.  Hyponatremia, hypokalemia, acute renal failure secondary to septic shock: Patient is getting potassium replacement and also she is on normal saline, and IV fluids to D5 water because of hyponatremia, prognosis poor due to advanced age, colitis, dementia CODE STATUS DNR, Discussed with patient's daughters at bedside.  3.. Septic shock ;stopped metoprolol, Cardizem. For dysphagia, possible esophageal stricture/mass ,  patient unable to get EGD because of septic shock, loculate abnormalities, advanced age unable to pass NG tube yesterday, barium study performed.. Prognosis really poor, discussed with granddaughters at bedside. #4 .hypokalemia: Replace the potassium level #5 hyponatremia: Correcting with fluids.  All the records are reviewed and case discussed with Care Management/Social Workerr. Management plans discussed with the patient, family and they are in agreement.  CODE STATUS: DNR  TOTAL TIME TAKING CARE OF THIS PATIENT: 35minutes.   Very sick unable to plan for discharge disposition.  Katha HammingSnehalatha Corneisha Alvi M.D on 04/02/2017 at 1:14 PM  Between 7am to 6pm - Pager - [phone]  After 6pm go to [email or web address] - password [email or web address] [facility]  [facility]  Office  [phone]  CC: Primary care physician; Leotis ShamesSingh, Jasmine, MD

## 2017-04-02 NOTE — Progress Notes (Signed)
Pharmacy Electrolyte Monitoring Consult:  Pharmacy consulted to assist in monitoring and replacing electrolytes in this 83 y.o. female with a h/o atrial fibrillation  admitted on 03/31/2017 with Altered Mental Status Patient with C diff.  12/12: Unable to place Dobhoff, IR consulted for tube placement.   Labs:  Sodium (mmol/L)  Date Value  04/02/2017 151 (H)   Potassium (mmol/L)  Date Value  04/02/2017 3.4 (L)   Magnesium (mg/dL)  Date Value  16/10/960412/13/2018 2.0   Phosphorus (mg/dL)  Date Value  54/09/811912/03/2017 3.1   Calcium (mg/dL)  Date Value  14/78/295612/13/2018 7.4 (L)   Albumin (g/dL)  Date Value  21/30/865712/02/2017 2.5 (L)    Plan: KCl 10 meq IV x 3. Will recheck this PM as patient continues to have significant GI losses.  Rebecca Webb, Rebecca Webb 04/02/2017 9:12 AM

## 2017-04-02 NOTE — Progress Notes (Signed)
Nutrition Follow-up  DOCUMENTATION CODES:   Severe malnutrition in context of chronic illness, Underweight  INTERVENTION:  Unable to initiate tube feeds as enteral access was unable to be obtained. Patient also now hemodynamically unstable with MAP 48-83 mmHg since midnight and on increasing doses of pressors today (though did note MAP goal was adjusted).  Per the 2016 ASPEN/SCCM guidelines, in patients at high nutrition risk or who are severely malnourished, it is recommended to initiate parenteral nutrition as soon as possible following ICU admission if enteral nutrition is not feasible. Use of PN in these high-risk/severely malnourished patients is more favorable than withholding nutrition and is associated with few complications, lower risk of mortality, and lower rate of infections.  Monitor magnesium, potassium, and phosphorus daily for at least 3 days, MD to replete as needed, as patient is at risk for refeeding syndrome given severe chronic malnutrition.  NUTRITION DIAGNOSIS:   Severe Malnutrition related to chronic illness(CKD, diarrhea) as evidenced by severe fat depletion, severe muscle depletion.  Ongoing.  GOAL:   Patient will meet greater than or equal to 90% of their needs  Not met.  MONITOR:   Diet advancement, Labs, Weight trends, Skin, I & O's  REASON FOR ASSESSMENT:   Malnutrition Screening Tool    ASSESSMENT:   83 year old female with PMHx of dementia, HTN, GERD, depression, A-fib, HLD, vitamin D deficiency, CKD, recent hip fracture s/p left intertrochanteric IM nail 02/11/2017 now admitted with sepsis, AKI, C. Difficile diarrhea.  -Dobbhoff tube was unable to be placed at bedside by RN or by IR on 12/12. Per chart tube coiled in distal esophagus and would not pass further. Barium was administered, which revealed narrowed distal esophagus concerning for distal esophageal malignancy vs severe inflammation. -GI was consulted for EGD and possible placement of  NGT pending findings. However, EGD cannot be attempted until her sepsis, shock, and electrolyte abnormalities have resolved.  MAP: 48-83 mmHg; patient is on increasing doses of pressors  Medications reviewed and include: Novolog 0-9 units Q4hrs, pantoprazole, vancomycin, amiodarone, D5W (60 grams dextrose, 204 kcal daily), Levophed gtt (dose increasing today currently at 15 mL/hr), phenylephrine gtt started today currently at 30 mL/hr, potassium chloride 10 mEq IV 6 times today.  Labs reviewed: CBG 55-143 past 24 hrs, Sodium 147 (trending down), Potassium 2.9 (dropped from 3.4 on AM labs), Chloride 120, CO2 21, BUN 28 (trending down), Creatinine 1.22 (trending down).  I/O: 4x unmeasured UOP, 50 mL stool output + 4x unmeasured stool output  Discussed with RN.  Diet Order:  Diet NPO time specified  EDUCATION NEEDS:   Not appropriate for education at this time  Skin:  Skin Assessment: Reviewed RN Assessment(ecchymosis to head)  Last BM:  12/12 - smear type 7 rectal tube  Height:   Ht Readings from Last 1 Encounters:  04/01/17 5' (1.524 m)    Weight:   Wt Readings from Last 1 Encounters:  04/02/17 94 lb 2.2 oz (42.7 kg)    Ideal Body Weight:  45.5 kg  BMI:  Body mass index is 18.38 kg/m.  Estimated Nutritional Needs:   Kcal:  1205-1405 (30-35 kcal/kg)  Protein:  60-70 grams (1.5-1.7 grams/kg)  Fluid:  1.2-1.4 L/day (1 mL/kcal)  Willey Blade, MS, RD, LDN Office: [phone] Pager: [phone] After Hours/Weekend Pager: [phone]

## 2017-04-02 NOTE — Progress Notes (Addendum)
0800: Patient responsive to voice but unable to answer questions appropriately. Heart rate fluctuating from the high 30's to mid 50's. Received in report that patient was sinus rhythm however upon initial assessment patient was in a fib. Md notified. Patient's MAP was increased from 55 to 65. Levophed was started. Patient's IV fluids changed from D5 1/2 NS to D5%. Received orders to place foley. Patient also was hypothermic at 96.5. Bair Hugger applied.     1030: Patient's heart rate has gradually increased. Currently in the 120's-150's. Patient also started on amiodarone drip at 8.273mL/hr (15mg /hr). Per MD and pharmacy due to patient's qtc being greater than 500.   11:45: Per GI MD, patient is currently not a candidate for an endoscopy to her hemodynamic instability. Patient will be re-evaluated after her sepsis and shock have resolved. Per Dr. Welton FlakesKhan, we will hold off on nutrition for another day.   1300: Patient's heart rate only slightly improved. MD notified. Received orders to use neosynephrine in lieu of levophed. Amiodarone dosage increased to 30mg /hr. Patient's K+ continues to be low at 2.9. Six additional runs of potassium ordered by pharmacy. Per MD, we should keep patient's K+ at 4 and mag at 2.   1500: Patient's HR continues to be elevated. Received orders to administer a 150mg  bolus of amiodarone.   1530: Patient's HR dropped from the 120's to 50's. MD notified. No new orders given. Patient's temp has increased to 98. Bair hugger removed.   1645: NP withdrew patient's CVC 6 cm per Dr. Welton FlakesKhan. Patient was given 2mg  of morphine. Amiodarone was paused through procedure due to lack of iv access and per NP.   1700: patient's HR has decreased into the high 40's NP notified. Received orders to continue to hold amiodarone.   1800: Patient HR continues to drop. Currently fluctuating from 37-50's. Per NP, patient received .5mg  of atropine and neo was increased from 25 to 40.   1830: patient HR has  increased and is currently in the 70's.

## 2017-04-02 NOTE — Consult Note (Signed)
Jonathon Bellows , MD [address], [place], [place], [place], [address] [id number] [place], [place], [place], [address] Phone: [phone]  Fax: [phone]  Consultation  Referring Provider: DR Vianne Bulls Primary Care Physician:  Glendon Axe, MD Primary Gastroenterologist: None          Reason for Consultation:     Esophageal stricture mass   Date of Admission:  03/31/2017 Date of Consultation:  04/02/2017         HPI:   Rebecca Webb is a 83 y.o. female with a history of dementia was admitted on 03/31/2017 with septic shock and acute renal failure.  She has been commenced on pressors for shock.  Recent hip fracture in October 2018.  Recent diagnosis of UTI.  On 04/01/2017 her white cell count was 18.7 with a hemoglobin of 12.1 potassium of 2.9 creatinine of 2.89.  [place] as well um of 151.  The reason I have been consulted is that the attempt to place a feeding tube today and is met with some resistance and on imaging it appears that there was possibly a distal tight stricture of the esophagus or a mass and hence endoscopy has been requested.  To speak, her granddaughter who is the legal power of attorney hold was also present she mentions that the patient cannot give much history but has been complaining of some difficulty swallowing for the last few days.  No significant issues previously.  She has been losing weight recently.  She underwent hip surgery recently.  At this point of time she is on pressor agents, amiodarone.   Past Medical History:  Diagnosis Date  . Atrial fibrillation ([place])   . Chronic kidney disease   . Dementia   . Dementia   . Depression   . GERD (gastroesophageal reflux disease)   . Hyperlipemia   . Hypertension   . Vitamin D deficiency     Past Surgical History:  Procedure Laterality Date  . CATARACT EXTRACTION    . INTRAMEDULLARY (IM) NAIL INTERTROCHANTERIC Left 02/11/2017   Procedure: INTRAMEDULLARY (IM) NAIL INTERTROCHANTRIC;  Surgeon: Earnestine Leys, MD;  Location: [facility] ORS;  Service: Orthopedics;  Laterality: Left;  . KIDNEY DONATION      Prior to Admission medications   Medication Sig Start Date End Date Taking? Authorizing Provider  aspirin EC 81 MG tablet Take 81 mg by mouth daily.    Yes [provider]  diltiazem (CARDIZEM) 30 MG tablet Take 1 tablet (30 mg total) by mouth every 6 (six) hours. 02/14/17  Yes Fritzi Mandes, MD  ferrous sulfate 325 (65 FE) MG tablet Take 1 tablet (325 mg total) by mouth daily with breakfast. 02/15/17  Yes Fritzi Mandes, MD  Vitamin D, Ergocalciferol, (DRISDOL) 50000 UNITS CAPS capsule Take 50,000 Units by mouth every 7 (seven) days.    Yes [provider]  amiodarone (PACERONE) 200 MG tablet Take 1 tablet (200 mg total) by mouth daily. Patient not taking: Reported on 03/31/2017 02/15/17   Fritzi Mandes, MD  diltiazem (CARDIZEM LA) 240 MG 24 hr tablet Take 1 tablet (240 mg total) by mouth daily. Patient not taking: Reported on 03/31/2017 12/24/16   Earleen Newport, MD  enoxaparin (LOVENOX) 40 MG/0.4ML injection Inject 0.4 mLs (40 mg total) into the skin daily. Patient not taking: Reported on 03/31/2017 02/15/17   Fritzi Mandes, MD  feeding supplement, ENSURE ENLIVE, (ENSURE ENLIVE) LIQD Take 237 mLs by mouth 3 (three) times daily between meals. 02/14/17   Posey Pronto,  Sona, MD  HYDROcodone-acetaminophen (NORCO/VICODIN) 5-325 MG tablet Take 1-2 tablets by mouth every 6 (six) hours as needed for moderate pain. Patient not taking: Reported on 03/31/2017 02/14/17   Fritzi Mandes, MD  Memantine HCl ER 21 MG CP24 Take 21 mg by mouth daily. Patient not taking: Reported on 03/31/2017 08/27/16   Rainey Pines, MD  metoprolol tartrate (LOPRESSOR) 25 MG tablet Take 0.5 tablets (12.5 mg total) by mouth 2 (two) times daily. 02/14/17   Fritzi Mandes, MD  Sennosides-Docusate Sodium (SENEXON-S PO) Take 2 tablets by mouth daily.    [provider]    No family history on file.   Social History    Tobacco Use  . Smoking status: Never Smoker  . Smokeless tobacco: Current User    Types: Snuff  Substance Use Topics  . Alcohol use: No  . Drug use: No    Allergies as of 03/31/2017 - Review Complete 03/31/2017  Allergen Reaction Noted  . Lisinopril Swelling 02/18/2015  . Haldol [haloperidol lactate] Rash 01/08/2016    Review of Systems:    Patient is unable to provide review of systems.   Physical Exam:  Vital signs in last 24 hours: Temp:  [96.7 F (35.9 C)-97.8 F (36.6 C)] 97.7 F (36.5 C) (12/13 0400) Pulse Rate:  [31-89] 54 (12/13 1100) Resp:  [8-23] 16 (12/13 1100) BP: (72-152)/(40-140) 86/45 (12/13 1100) SpO2:  [78 %-100 %] 99 % (12/13 1100) Weight:  [94 lb 2.2 oz (42.7 kg)] 94 lb 2.2 oz (42.7 kg) (12/13 0355)   General: Laying down comfortably in bed not in any distress or discomfort Head:  Normocephalic and atraumatic. Eyes:   No icterus.   Conjunctiva pink. PERRLA. Ears: Unable to assess. Neck:  Supple; no masses or thyroidomegaly Lungs: Respirations even and unlabored. Lungs clear to auscultation bilaterally.   No wheezes, crackles, or rhonchi.  Heart:  Regular rate and rhythm;  Without murmur, clicks, rubs or gallops Abdomen:  Soft, nondistended, nontender. Normal bowel sounds. No appreciable masses or hepatomegaly.  No rebound or guarding.  Neurologic:  Alert and oriented x0;   Skin:  Intact without significant lesions or rashes. Cervical Nodes:  No significant cervical adenopathy. Psych: She is unable to provide any form of meaningful conversation  LAB RESULTS: Recent Labs    03/31/17 1923 04/01/17 0421  WBC 15.8* 18.7*  HGB 13.6 12.1  HCT 41.5 37.7  PLT 227 199   BMET Recent Labs    03/31/17 1923 04/01/17 0421 04/01/17 1312 04/01/17 1351 04/02/17 0350  NA 158* 151*  --   --  151*  K 2.6* 2.9* 2.8* 2.9* 3.4*  CL 118* 117*  --   --  123*  CO2 21* 22  --   --  21*  GLUCOSE 115* 208*  --   --  86  BUN 73* 63*  --   --  35*  CREATININE  3.50* 2.89*  --   --  1.63*  CALCIUM 8.3* 7.2*  --   --  7.4*   LFT Recent Labs    03/31/17 1923  PROT 6.1*  ALBUMIN 2.5*  AST 67*  ALT 33  ALKPHOS 141*  BILITOT 1.0   PT/INR Recent Labs    03/31/17 1923  LABPROT 14.7  INR 1.16    STUDIES: Dg Chest Port 1 View  Result Date: 03/31/2017 CLINICAL DATA:  Altered mental status. EXAM: PORTABLE CHEST 1 VIEW COMPARISON:  Single-view of the chest 02/05/2017. FINDINGS: New right IJ approach central venous catheter  tip projects in the right atrium. The catheter should be withdrawn approximately 6 cm. No pneumothorax. Lungs are clear. Heart size is normal. No pleural fluid. No acute bony abnormality. IMPRESSION: Right IJ catheter tip projects within the right atrium. Recommend withdrawal of 6 cm. No pneumothorax. Lungs clear. Electronically Signed   By: Inge Rise M.D.   On: 03/31/2017 19:38   Dg Addison Bailey G Tube Plc W/fl W/rad  Result Date: 04/01/2017 CLINICAL DATA:  Decreased oral intake, mental status change, weight loss. EXAM: NASO G TUBE PLACEMENT WITH FL AND WITH RAD CONTRAST:  20 cc of dilute thin barium. FLUOROSCOPY TIME:  Fluoroscopy Time:  2 minutes, 36 seconds Radiation Exposure Index (if provided by the fluoroscopic device): 620 micro Gy per meters square Number of Acquired Spot Images: 1 screen save COMPARISON:  None in PACs FINDINGS: The patient was on contact isolation and the 2 Radiology technologist and myself were appropriately attired for this level of isolation. The patient was not alert or oriented during the study. The Dobhoff tube and guidewire were hydrated and the tip lubricated with lubricating jelly. The tube was passed through the left nostril without difficulty and into the esophagus without difficulty. However in the distal esophagus the tube re-curved upon itself and would not pass further. A small amount of dilute barium was then administered which revealed a markedly narrowed distal esophagus with only tiny  amounts of the barium passing into the stomach. The tube tip could not be directed through this area of narrowing. The tube was then removed and the procedure terminated. IMPRESSION: Distal esophageal stricture exhibiting irregularity and preventing passage of the Dobhoff tube into the stomach. The findings are worrisome for distal esophageal malignancy though severe inflammation could produce similar findings given the history of gastroesophageal reflux. Direct visualization is recommended to further evaluate the distal esophagus. Electronically Signed   By: David  Martinique M.D.   On: 04/01/2017 17:22      Impression / Plan:   AMERAH PULEO is a 83 y.o. y/o female with sepsis, shock secondary to C. difficile colitis.  I have been consulted for evaluation for an upper endoscopy for a possible stricture versus mass was seen on barium study which was performed after failure to pass an NG tube.  The patient is on levo fed at this point of time and I would not be able to  perform an upper endoscopy.  Once her sepsis, shock, electrolyte abnormalities resolved please contact me and we will plan for an upper endoscopy to evaluate the stricture versus possible mass in this area.  Thank you for involving me in the care of this patient.      LOS: 2 days   Jonathon Bellows, MD  04/02/2017, 11:48 AM

## 2017-04-02 NOTE — Progress Notes (Signed)
Per Dr. Welton FlakesKhan, patient's MAP goal is now 65. Patient was started back on Levophed.

## 2017-04-02 NOTE — Progress Notes (Addendum)
[facility] Pulmonary Medicine Consultation     Date: 04/02/2017,   MRN# [id number] Rebecca Webb November 15, 1928 Code Status:     Code Status Orders  (From admission, onward)        Start     Ordered   03/31/17 2233  Do not attempt resuscitation (DNR)  Continuous    Question Answer Comment  In the event of cardiac or respiratory ARREST Do not call a "code blue"   In the event of cardiac or respiratory ARREST Do not perform Intubation, CPR, defibrillation or ACLS   In the event of cardiac or respiratory ARREST Use medication by any route, position, wound care, and other measures to relive pain and suffering. May use oxygen, suction and manual treatment of airway obstruction as needed for comfort.      03/31/17 2232    Code Status History    Date Active Date Inactive Code Status Order ID Comments User Context   03/31/2017 21:49 03/31/2017 22:32 Full Code [id number]  Oralia Manis, MD ED   02/05/2017 20:09 02/14/2017 20:07 Full Code [id number]  Katha Hamming, MD ED   02/18/2015 19:56 02/19/2015 15:04 Full Code [id number]  Milagros Loll, MD ED      AdmissionWeight: 90 lb (40.8 kg)                 CurrentWeight: 94 lb 2.2 oz (42.7 kg) Rebecca Webb is a 83 y.o. old female     SUBJECTIVE:   No acute issues overnight. Diarrhea resolved. Vasopressors weaned off per mean arterial pressure goals in the morning. Patient is in A. fib. Blood cultures have remained negative.   MEDICATIONS    Home Medication:    Current Medication:   Current Facility-Administered Medications:  .  acetaminophen (TYLENOL) tablet 650 mg, 650 mg, Oral, Q6H PRN **OR** acetaminophen (TYLENOL) suppository 650 mg, 650 mg, Rectal, Q6H PRN, Oralia Manis, MD .  amiodarone (NEXTERONE PREMIX) 360-4.14 MG/200ML-% (1.8 mg/mL) IV infusion, 15 mg/hr, Intravenous, Continuous, Cristy Hilts, MD, Last Rate: 8.3 mL/hr at 04/02/17 1053, 15 mg/hr at 04/02/17 1053 .  dextrose 5 % solution, , Intravenous,  Continuous, Cristy Hilts, MD, Last Rate: 50 mL/hr at 04/02/17 0941 .  feeding supplement (OSMOLITE 1.2 CAL) liquid 1,000 mL, 1,000 mL, Per Tube, Q24H, Kasa, Kurian, MD .  heparin injection 5,000 Units, 5,000 Units, Subcutaneous, Q8H, Oralia Manis, MD, 5,000 Units at 04/02/17 0534 .  metroNIDAZOLE (FLAGYL) IVPB 500 mg, 500 mg, Intravenous, Q8H, Erin Fulling, MD, Stopped at 04/02/17 0454 .  norepinephrine (LEVOPHED) 4 mg in dextrose 5 % 250 mL (0.016 mg/mL) infusion, 0-40 mcg/min, Intravenous, Continuous, Kasa, Kurian, MD, Last Rate: 7.5 mL/hr at 04/02/17 0956, 2 mcg/min at 04/02/17 0956 .  ondansetron (ZOFRAN) tablet 4 mg, 4 mg, Oral, Q6H PRN **OR** ondansetron (ZOFRAN) injection 4 mg, 4 mg, Intravenous, Q6H PRN, Oralia Manis, MD .  potassium chloride 10 mEq in 100 mL IVPB, 10 mEq, Intravenous, Q1 Hr x 3, Katha Hamming, MD, Stopped at 04/02/17 1126 .  vancomycin (VANCOCIN) 500 mg in sodium chloride irrigation 0.9 % 100 mL ENEMA, 500 mg, Rectal, Q6H, Tukov, Magadalene S, NP, 500 mg at 04/02/17 0604      VS: BP (!) 79/54   Pulse 64   Temp 97.7 F (36.5 C) (Axillary)   Resp 15   Ht 5' (1.524 m)   Wt 94 lb 2.2 oz (42.7 kg)   SpO2 99%   BMI 18.38 kg/m      PHYSICAL EXAM  Physical Exam Confused.  Not following commands. CVS S1, S2, 0. Chest is clear to auscultation bilaterally with no rales rhonchi wheezes. Abdomen is soft, nontender, nondistended.  Bowel sounds are positive. No lower extremity edema bilaterally.     LABS    Recent Labs    03/31/17 1923 04/01/17 0421 04/02/17 0350 04/02/17 1145  HGB 13.6 12.1  --  10.6*  HCT 41.5 37.7  --  32.5*  MCV 93.5 93.0  --  92.3  WBC 15.8* 18.7*  --  11.4*  BUN 73* 63* 35* 28*  CREATININE 3.50* 2.89* 1.63* 1.22*  GLUCOSE 115* 208* 86 113*  CALCIUM 8.3* 7.2* 7.4* 6.9*  INR 1.16  --   --   --   ,    No results for input(s): PH in the last 72 hours.  Invalid input(s): PCO2, PO2, BASEEXCESS, BASEDEFICITE,  TFT    CULTURE RESULTS   Recent Results (from the past 240 hour(s))  Blood Culture (routine x 2)     Status: None (Preliminary result)   Collection Time: 03/31/17  7:23 PM  Result Value Ref Range Status   Specimen Description BLOOD RIGHT CENTRAL LINE  Final   Special Requests   Final    BOTTLES DRAWN AEROBIC AND ANAEROBIC Blood Culture adequate volume   Culture NO GROWTH 2 DAYS  Final   Report Status PENDING  Incomplete  Blood Culture (routine x 2)     Status: None (Preliminary result)   Collection Time: 03/31/17  7:23 PM  Result Value Ref Range Status   Specimen Description BLOOD RIGHT CENTRAL LINE  Final   Special Requests   Final    BOTTLES DRAWN AEROBIC AND ANAEROBIC Blood Culture adequate volume   Culture NO GROWTH 2 DAYS  Final   Report Status PENDING  Incomplete  MRSA PCR Screening     Status: None   Collection Time: 03/31/17 10:13 PM  Result Value Ref Range Status   MRSA by PCR NEGATIVE NEGATIVE Final    Comment:        The GeneXpert MRSA Assay (FDA approved for NASAL specimens only), is one component of a comprehensive MRSA colonization surveillance program. It is not intended to diagnose MRSA infection nor to guide or monitor treatment for MRSA infections.   C difficile quick scan w PCR reflex     Status: Abnormal   Collection Time: 03/31/17 10:13 PM  Result Value Ref Range Status   C Diff antigen POSITIVE (A) NEGATIVE Final   C Diff toxin POSITIVE (A) NEGATIVE Final   C Diff interpretation Toxin producing C. difficile detected.  Final    Comment: CRITICAL RESULT CALLED TO, READ BACK BY AND VERIFIED WITH: BETH BUONO ON 03/31/17 AT 2330 JAG   Gastrointestinal Panel by PCR , Stool     Status: Abnormal   Collection Time: 03/31/17 10:13 PM  Result Value Ref Range Status   Campylobacter species NOT DETECTED NOT DETECTED Final   Plesimonas shigelloides NOT DETECTED NOT DETECTED Final   Salmonella species NOT DETECTED NOT DETECTED Final   Yersinia  enterocolitica NOT DETECTED NOT DETECTED Final   Vibrio species NOT DETECTED NOT DETECTED Final   Vibrio cholerae NOT DETECTED NOT DETECTED Final   Enteroaggregative E coli (EAEC) NOT DETECTED NOT DETECTED Final   Enteropathogenic E coli (EPEC) DETECTED (A) NOT DETECTED Final    Comment: RESULT CALLED TO, READ BACK BY AND VERIFIED WITH: RENE BADD ON 04/01/17 AT 0006 JAG    Enterotoxigenic E coli (ETEC) NOT  DETECTED NOT DETECTED Final   Shiga like toxin producing E coli (STEC) NOT DETECTED NOT DETECTED Final   Shigella/Enteroinvasive E coli (EIEC) NOT DETECTED NOT DETECTED Final   Cryptosporidium NOT DETECTED NOT DETECTED Final   Cyclospora cayetanensis NOT DETECTED NOT DETECTED Final   Entamoeba histolytica NOT DETECTED NOT DETECTED Final   Giardia lamblia NOT DETECTED NOT DETECTED Final   Adenovirus F40/41 NOT DETECTED NOT DETECTED Final   Astrovirus NOT DETECTED NOT DETECTED Final   Norovirus GI/GII NOT DETECTED NOT DETECTED Final   Rotavirus A NOT DETECTED NOT DETECTED Final   Sapovirus (I, II, IV, and V) NOT DETECTED NOT DETECTED Final          IMAGING    Dg [place] G Tube Plc W/fl W/rad  Result Date: 04/01/2017 CLINICAL DATA:  Decreased oral intake, mental status change, weight loss. EXAM: NASO G TUBE PLACEMENT WITH FL AND WITH RAD CONTRAST:  20 cc of dilute thin barium. FLUOROSCOPY TIME:  Fluoroscopy Time:  2 minutes, 36 seconds Radiation Exposure Index (if provided by the fluoroscopic device): 620 micro Gy per meters square Number of Acquired Spot Images: 1 screen save COMPARISON:  None in PACs FINDINGS: The patient was on contact isolation and the 2 Radiology technologist and myself were appropriately attired for this level of isolation. The patient was not alert or oriented during the study. The Dobhoff tube and guidewire were hydrated and the tip lubricated with lubricating jelly. The tube was passed through the left nostril without difficulty and into the esophagus without  difficulty. However in the distal esophagus the tube re-curved upon itself and would not pass further. A small amount of dilute barium was then administered which revealed a markedly narrowed distal esophagus with only tiny amounts of the barium passing into the stomach. The tube tip could not be directed through this area of narrowing. The tube was then removed and the procedure terminated. IMPRESSION: Distal esophageal stricture exhibiting irregularity and preventing passage of the Dobhoff tube into the stomach. The findings are worrisome for distal esophageal malignancy though severe inflammation could produce similar findings given the history of gastroesophageal reflux. Direct visualization is recommended to further evaluate the distal esophagus. Electronically Signed   By: David  [place] M.D.   On: 04/01/2017 17:22         ASSESSMENT/PLAN   83 years old lady with past medical history significant for atrial fibrillation, CKD, depression, dementia, GERD, dyslipidemia, hypertension, vitamin D deficiency, hip fracture in October 2018, kidney donation who has been admitted to the ICU with septic shock and C. difficile colitis.  Problem list: Severe sepsis with shock Dehydration C. difficile colitis Acute on chronic kidney injury Encephalopathy Hypernatremia Hypokalemia Troponin elevation  Overall the patient remains critically ill.  Patient's MAP goals have been increased to 65.  In order to meet the patient's new map goals, the patient was given a bolus of 1000 mL's of 0.45% normal saline and was started back on Levophed. Titrate Levophed to keep mean arterial pressure more than 65. Continue vancomycin enemas and IV Flagyl.  Patient unable to take p.o. vancomycin given the inability to pass an OG tube IR. Follow cultures. IV fluids - using cautiously to prevent a rapid decline in serum sodium while continuing to provide hydration  Gastroenterology consulted for endoscopy for stricture  and possible malignancy and based on their findings, to see if an OG tube can be placed.  Place a Foley.  Monitor urine output. Creatinine improving.  Monitor daily creatinines.  Patient's encephalopathy could be related to the hypernatremia. Hypernatremia is slowly improving. DC D5 half-normal saline and start D5 water at 50 mL's per hour. Recheck BMP at 1 PM to assess the sodium levels. The patient remains encephalopathic after correction of hypernatremia, will get a CT scan of the head.  Troponin elevation is likely secondary to severe sepsis as well as acute on chronic kidney injury. Patient is in atrial fibrillation.  EKG morning shows atrial fibrillation. Repeat troponin levels. Patient is on p.o. amiodarone at home.  Given that she cannot take p.o. amiodarone, will start amiodarone drip at 0.25 mg/h given its interactions with other medications. Follow Echo results Keep potassium more than 4.  Replace potassium-30 meq IV given.  Will withdraw the IJ triple-lumen catheter 6 cm as recommended by radiology.  Continue holding home metoprolol and Cardizem given the patient's hypotension. Continue holding home aspirin given that the patient cannot take anything p.o.  Heparin subcu for DVT prophylaxis Protonix for GI prophylaxis. Insulin sliding scale for glycemic control.  CODE STATUS DNR/DNI.  Overall prognosis is guarded.  Critical care time spent was 55 minutes   Cristy HiltsIrtaza Breasia Karges, M.D.  Pulmonary & Critical Care Medicine   Addendum:  Labs reviewed. Troponin downtrending.  No need to follow it further.  Sodium level is coming down nicely. Hypokalemia noted.  KCl 60 M EQ was given. Repeat BMP at 8 PM  Hemoglobin drop noted.  No overt bleed.  PPI as above.  No melena. Repeat hemoglobin at 8 PM.  Transfuse for hemoglobin less than 7.  Cristy HiltsIrtaza Sloan Takagi, M.D.  Pulmonary & Critical Care Medicine

## 2017-04-03 ENCOUNTER — Inpatient Hospital Stay: Payer: Medicare Other

## 2017-04-03 DIAGNOSIS — L8990 Pressure ulcer of unspecified site, unspecified stage: Secondary | ICD-10-CM

## 2017-04-03 LAB — BASIC METABOLIC PANEL
Anion gap: 3 — ABNORMAL LOW (ref 5–15)
BUN: 18 mg/dL (ref 6–20)
CHLORIDE: 120 mmol/L — AB (ref 101–111)
CO2: 22 mmol/L (ref 22–32)
Calcium: 7.1 mg/dL — ABNORMAL LOW (ref 8.9–10.3)
Creatinine, Ser: 0.99 mg/dL (ref 0.44–1.00)
GFR, EST AFRICAN AMERICAN: 58 mL/min — AB (ref >60)
GFR, EST NON AFRICAN AMERICAN: 50 mL/min — AB (ref >60)
Glucose, Bld: 90 mg/dL (ref 65–99)
POTASSIUM: 3.4 mmol/L — AB (ref 3.5–5.1)
SODIUM: 145 mmol/L (ref 135–145)

## 2017-04-03 LAB — CBC
HCT: 31.4 % — ABNORMAL LOW (ref 35.0–47.0)
Hemoglobin: 10.2 g/dL — ABNORMAL LOW (ref 12.0–16.0)
MCH: 30.1 pg (ref 26.0–34.0)
MCHC: 32.5 g/dL (ref 32.0–36.0)
MCV: 92.8 fL (ref 80.0–100.0)
PLATELETS: 136 10*3/uL — AB (ref 150–440)
RBC: 3.38 MIL/uL — ABNORMAL LOW (ref 3.80–5.20)
RDW: 17 % — AB (ref 11.5–14.5)
WBC: 9.2 10*3/uL (ref 3.6–11.0)

## 2017-04-03 LAB — GLUCOSE, CAPILLARY
GLUCOSE-CAPILLARY: 136 mg/dL — AB (ref 65–99)
GLUCOSE-CAPILLARY: 144 mg/dL — AB (ref 65–99)
GLUCOSE-CAPILLARY: 75 mg/dL (ref 65–99)
GLUCOSE-CAPILLARY: 88 mg/dL (ref 65–99)
Glucose-Capillary: 85 mg/dL (ref 65–99)
Glucose-Capillary: 77 mg/dL (ref 65–99)
Glucose-Capillary: 68 mg/dL (ref 65–99)
Glucose-Capillary: 107 mg/dL — ABNORMAL HIGH (ref 65–99)
Glucose-Capillary: 69 mg/dL (ref 65–99)

## 2017-04-03 LAB — PHOSPHORUS: Phosphorus: 1.5 mg/dL — ABNORMAL LOW (ref 2.5–4.6)

## 2017-04-03 LAB — MAGNESIUM
MAGNESIUM: 1.3 mg/dL — AB (ref 1.7–2.4)
MAGNESIUM: 2 mg/dL (ref 1.7–2.4)

## 2017-04-03 LAB — PROCALCITONIN

## 2017-04-03 LAB — POTASSIUM: Potassium: 3.8 mmol/L (ref 3.5–5.1)

## 2017-04-03 MED ORDER — MAGNESIUM SULFATE 2 GM/50ML IV SOLN
2.0000 g | Freq: Once | INTRAVENOUS | Status: AC
  Administered 2017-04-03: 2 g via INTRAVENOUS
  Filled 2017-04-03: qty 50

## 2017-04-03 MED ORDER — POTASSIUM CHLORIDE 10 MEQ/50ML IV SOLN
10.0000 meq | INTRAVENOUS | Status: AC
  Administered 2017-04-03 (×5): 10 meq via INTRAVENOUS
  Filled 2017-04-03 (×5): qty 50

## 2017-04-03 MED ORDER — SODIUM CHLORIDE 0.9 % IV SOLN
2.0000 g | Freq: Once | INTRAVENOUS | Status: AC
  Administered 2017-04-03: 2 g via INTRAVENOUS
  Filled 2017-04-03: qty 20

## 2017-04-03 MED ORDER — POTASSIUM PHOSPHATES 15 MMOLE/5ML IV SOLN
20.0000 mmol | Freq: Once | INTRAVENOUS | Status: AC
  Administered 2017-04-03: 20 mmol via INTRAVENOUS
  Filled 2017-04-03: qty 6.67

## 2017-04-03 MED ORDER — SODIUM CHLORIDE 0.45 % IV SOLN
INTRAVENOUS | Status: DC
  Administered 2017-04-03 (×2): via INTRAVENOUS

## 2017-04-03 MED ORDER — SODIUM CHLORIDE 0.9 % IV BOLUS (SEPSIS): 1000.0000 mL | Freq: Once | INTRAVENOUS | Status: DC

## 2017-04-03 MED ORDER — DOPAMINE-DEXTROSE 3.2-5 MG/ML-% IV SOLN
0.0000 ug/kg/min | INTRAVENOUS | Status: DC
  Administered 2017-04-03: 5 ug/kg/min via INTRAVENOUS
  Administered 2017-04-04: 8 ug/kg/min via INTRAVENOUS
  Filled 2017-04-03 (×3): qty 250

## 2017-04-03 MED ORDER — DEXTROSE 50 % IV SOLN
INTRAVENOUS | Status: AC
  Administered 2017-04-03: 50 mL
  Filled 2017-04-03: qty 100

## 2017-04-03 MED ORDER — TRACE MINERALS CR-CU-MN-SE-ZN 10-1000-500-60 MCG/ML IV SOLN
INTRAVENOUS | Status: AC
  Administered 2017-04-03: 18:00:00 via INTRAVENOUS
  Filled 2017-04-03: qty 600

## 2017-04-03 NOTE — Progress Notes (Signed)
[facility] -  at [facility]   PATIENT NAME: Rebecca Webb    MR#:  [id number]  DATE OF BIRTH:  September 21, 1928  SUBJECTIVE: Admitted for septic shock .  More alert today.  However has bradycardia.  Dopamine is started.  CHIEF COMPLAINT:   Chief Complaint  Patient presents with  . Altered Mental Status    REVIEW OF SYSTEMS:    Review of Systems  Unable to perform ROS: Dementia    Nutrition: Poor p.o. intake Tolerating Diet: Tolerating PT:      DRUG ALLERGIES:   Allergies  Allergen Reactions  . Lisinopril Swelling    Tongue and lips was swollen.  . Haldol [Haloperidol Lactate] Rash    VITALS:  Blood pressure 101/70, pulse 67, temperature (!) 97.5 F (36.4 C), temperature source Oral, resp. rate 13, height 5' (1.524 m), weight 46.3 kg (102 lb 1.2 oz), SpO2 97 %.  PHYSICAL EXAMINATION:   Physical Exam  GENERAL:  83 y.o.-year-old patient lying in the bed .  Appears cachectic. EYES: Pupils equal, round, reactive to light . No scleral icterus. Extraocular muscles intact.  HEENT: Head atraumatic, normocephalic. Oropharynx and nasopharynx clear.  NECK:  Supple, no jugular venous distention. No thyroid enlargement, no tenderness.  LUNGS: Normal breath sounds bilaterally, no wheezing, rales,rhonchi or crepitation. No use of accessory muscles of respiration.  CARDIOVASCULAR: S1, S2 normal. No murmurs, rubs, or gallops.  ABDOMEN: Soft, nontender, nondistended. Bowel sounds present. No organomegaly or mass.  EXTREMITIES: No pedal edema, cyanosis, or clubbing.  NEUROLOGIC unable to do  neurological exam because of her dementia.  PSYCHIATRIC: Patient is demented.   SKIN: No obvious rash, lesion, or ulcer.    LABORATORY PANEL:   CBC Recent Labs  Lab 04/03/17 0845  WBC 9.2  HGB 10.2*  HCT 31.4*  PLT 136*   ------------------------------------------------------------------------------------------------------------------  Chemistries  Recent  Labs  Lab 03/31/17 1923  04/03/17 0405  NA 158*   < > 145  K 2.6*   < > 3.4*  CL 118*   < > 120*  CO2 21*   < > 22  GLUCOSE 115*   < > 90  BUN 73*   < > 18  CREATININE 3.50*   < > 0.99  CALCIUM 8.3*   < > 7.1*  MG  --    < > 1.3*  AST 67*  --   --   ALT 33  --   --   ALKPHOS 141*  --   --   BILITOT 1.0  --   --    < > = values in this interval not displayed.   ------------------------------------------------------------------------------------------------------------------  Cardiac Enzymes Recent Labs  Lab 04/02/17 1145  TROPONINI 0.24*   ------------------------------------------------------------------------------------------------------------------  RADIOLOGY:  Dg Chest Port 1 View  Result Date: 04/02/2017 CLINICAL DATA:  Bedside central venous catheter placement. EXAM: PORTABLE CHEST 1 VIEW COMPARISON:  03/31/2017, 02/05/2017 and earlier. FINDINGS: Right jugular central venous catheter tip projects at or near the cavoatrial junction. No evidence of pneumothorax or mediastinal hematoma. Development of consolidation in the left lower lobe and a possible small left pleural effusion since the examination 2 days ago. Lungs remain clear otherwise. IMPRESSION: 1. Right jugular central venous catheter tip projects at or near the cavoatrial junction. No acute complicating features. 2. Developing left lower lobe atelectasis and/or pneumonia and possible left pleural effusion since the examination 2 days ago. Electronically Signed   By: Hulan Saashomas  Lawrence M.D.   On: 04/02/2017 17:42  Dg Naso G Tube Plc W/fl W/rad  Result Date: 04/01/2017 CLINICAL DATA:  Decreased oral intake, mental status change, weight loss. EXAM: NASO G TUBE PLACEMENT WITH FL AND WITH RAD CONTRAST:  20 cc of dilute thin barium. FLUOROSCOPY TIME:  Fluoroscopy Time:  2 minutes, 36 seconds Radiation Exposure Index (if provided by the fluoroscopic device): 620 micro Gy per meters square Number of Acquired Spot Images: 1  screen save COMPARISON:  None in PACs FINDINGS: The patient was on contact isolation and the 2 Radiology technologist and myself were appropriately attired for this level of isolation. The patient was not alert or oriented during the study. The Dobhoff tube and guidewire were hydrated and the tip lubricated with lubricating jelly. The tube was passed through the left nostril without difficulty and into the esophagus without difficulty. However in the distal esophagus the tube re-curved upon itself and would not pass further. A small amount of dilute barium was then administered which revealed a markedly narrowed distal esophagus with only tiny amounts of the barium passing into the stomach. The tube tip could not be directed through this area of narrowing. The tube was then removed and the procedure terminated. IMPRESSION: Distal esophageal stricture exhibiting irregularity and preventing passage of the Dobhoff tube into the stomach. The findings are worrisome for distal esophageal malignancy though severe inflammation could produce similar findings given the history of gastroesophageal reflux. Direct visualization is recommended to further evaluate the distal esophagus. Electronically Signed   By: David  SwazilandJordan M.D.   On: 04/01/2017 17:22     ASSESSMENT AND PLAN:   Principal Problem:   Sepsis (HCC) Active Problems:   A-fib (HCC)   Alzheimer's dementia   Benign essential hypertension   AKI (acute kidney injury) (HCC)   Elevated troponin   C. difficile diarrhea   Pressure injury of skin   #1 sepsis present on admission secondary to C. difficile colitis.  Patient is in septic shock so she is in ICU getting IV fluids, pressors, antibiotics with Flagyl, vancomycin and Lemus. 2.  Hyponatremia, hypokalemia, acute renal failure secondary to septic shock: Patient is getting potassium replacement and also she is on normal saline, and IV fluids to D5 water because of hyponatremia, prognosis poor due to  advanced age, colitis, dementia CODE STATUS DNR, Discussed with patient's daughters at bedside.  Because of bradycardia patient is on dopamine, stopped Neo-Synephrine.  3.. Septic shock ;stopped metoprolol, Cardizem.  Secondary to hypotension. For dysphagia, possible esophageal stricture/mass , patient unable to get EGD because of septic shock, loculate abnormalities, advanced age unable to pass NG tube yesterday, barium study performed.. Prognosis really poor, discussed with granddaughters at bedside. #4 .hypokalemia: Replace the potassium level #5 hyponatremia: Correcting with fluids.  All the records are reviewed and case discussed with Care Management/Social Workerr. Management plans discussed with the patient, family and they are in agreement.  CODE STATUS: DNR  TOTAL TIME TAKING CARE OF THIS PATIENT: 35minutes.   Very sick unable to plan for discharge disposition.  Katha HammingSnehalatha Arnoldo Hildreth M.D on 04/03/2017 at 2:46 PM  Between 7am to 6pm - Pager - [phone]  After 6pm go to [email or web address] - password [email or web address] [facility]  [facility]  Office  [phone]  CC: Primary care physician; Leotis ShamesSingh, Jasmine, MD

## 2017-04-03 NOTE — Consult Note (Signed)
WOC Nurse wound consult note Reason for Consult:Dry stable eschar to left foot.  Due to sepsis and pressor administration, feet are cool. Poor PO intake, albumin is 2.5.  Poor potential to heal.  Will keep dry and intact at this time.  Wound type: Unstagebale pressure injury Pressure Injury POA: Yes/ Dressing procedure/placement/frequency:Dry dressing only.  Poor potential to heal.  Will not follow at this time.  Please re-consult if needed.  Maple HudsonKaren Talise Sligh RN BSN CWON Pager [phone]

## 2017-04-03 NOTE — Progress Notes (Signed)
Patient's heart rate reached 40 at this time and NP notified. Was 45-52 bpm at the beginning of the shift. Patient resting comfortably. Tolerated vancomycin enema without issues. Patient not compliant with turns as she will roll back once moved.

## 2017-04-03 NOTE — Progress Notes (Signed)
Nutrition Follow-up  DOCUMENTATION CODES:   Severe malnutrition in context of chronic illness, Underweight  INTERVENTION:  Unable to initiate tube feeds, enteral access unable to be obtained secondary to esophageal mass and stricture  Recommend begin Clinimix 5/20 at 52m/hr Goal rate 573mhr At goal provides 1161 calories (96% estimated needs) 66gm protein, 132018motal volume Hold Lipids for 7 days per ASPEN guidelines  Monitor CBGs, Phos, K+, Mg, for at least 3 days, replete as needed, patient is at risk for refeeding.  NUTRITION DIAGNOSIS:   Severe Malnutrition related to chronic illness(CKD, diarrhea) as evidenced by severe fat depletion, severe muscle depletion.-ongoing  GOAL:   Patient will meet greater than or equal to 90% of their needs -not met  MONITOR:   Diet advancement, Labs, Weight trends, Skin, I & O's  REASON FOR ASSESSMENT:   Malnutrition Screening Tool    ASSESSMENT:   [age]ar old female with PMHx of dementia, HTN, GERD, depression, A-fib, HLD, vitamin D deficiency, CKD, recent hip fracture s/p left intertrochanteric IM nail 02/11/2017 now admitted with sepsis, AKI, C. Difficile diarrhea.  EGD unable to performed per GI due to hemodynamic instability yesterday. Waiting for patient to come off of vasopressors.  MAP: 75-97 Remains critically ill. Resting comfortably  Access:  RIJ CVC 12/11 >> R Hand PIV, R Forearm PIV  Labs reviewed:  K+ 3.4, Mg 1.3  Medications reviewed and include:  Insulin 1/2 NS at 50m83m D5 at 50mL25m --> 204 calories Dopamine gtt, MgSO4 2g BID 10K+ IV Runs x5    Intake/Output Summary (Last 24 hours) at 04/03/2017 1321 Last data filed at 04/03/2017 0600 Gross per 24 hour  Intake 2153.65 ml  Output 3120 ml  Net -966.35 ml  5.2L Fluid Positive  Diet Order:  Diet NPO time specified  EDUCATION NEEDS:   Not appropriate for education at this time  Skin:  Skin Assessment: Skin Integrity Issues: Skin Integrity  Issues:: Unstageable Unstageable: To foot  Last BM:  12/13  Height:   Ht Readings from Last 1 Encounters:  04/01/17 5' (1.524 m)    Weight:   Wt Readings from Last 1 Encounters:  04/03/17 102 lb 1.2 oz (46.3 kg)    Ideal Body Weight:  45.5 kg  BMI:  Body mass index is 19.93 kg/m.  Estimated Nutritional Needs:   Kcal:  1205-1405 (30-35 kcal/kg)  Protein:  60-70 grams (1.5-1.7 grams/kg)  Fluid:  1.2-1.4 L/day (1 mL/kcal)  WilliSatira Anisd, MS, RD LDN Inpatient Clinical Dietitian Pager [phone]

## 2017-04-03 NOTE — Progress Notes (Signed)
Patient is more alert today. Still unable answer most questions, except to say "I feels alright".Patient was given a total of 2x 1L NS boluses. CVP are being taken as well. Maintenance fluids have been adjusted. Marland Kitchen.45% NS at 75 was added. Patient had one episode of hypoglycemia and received 25mL of D50%. Patient was also taken off of neo and put on dopamine to regulate her blood pressure and increase her heart rate. Urine output has been good. Has had 1200mL since the start of shift. Potassium, calcium, and mag have been replaced. CT of the head is still pending. In addition, patient had an EKG which indicated patient has converted to sinus rhythm.

## 2017-04-03 NOTE — Progress Notes (Signed)
PHARMACY - ADULT TOTAL PARENTERAL NUTRITION CONSULT NOTE   Pharmacy Consult for TPN Indication: no intake x 7 days   Patient Measurements: Height: 5' (152.4 cm) Weight: 102 lb 1.2 oz (46.3 kg) IBW/kg (Calculated) : 45.5 TPN AdjBW (KG): 40.2 Body mass index is 19.93 kg/m.   Assessment:  Pharmacy consulted for TPN management for 83 yo female being treated for Cdiff. Patient unable to receive tube feeds due to esophageal mass and stricture. Patient has received potassium and magnesium replacement on 12/14. Will order additional potassium phosphate 20mmol IV x 1. Patient is at risk for refeeding syndrome.    TPN Access: RIJ TPN start date: 12/14  Goal TPN rate is 55 ml/hr; if tolerated will advance to goal on 12/15  Current Nutrition:   Plan:  5/20 TPN at 25 mL/hr. Hold 20% lipid emulsion for first 7 days for ICU patients per ASPEN guidelines (Start date 12/21) This TPN provides 66 g of protein, 1320 g of dextrose which provides 1161 kCals per day, meeting 96% of patient needs Electrolytes in TPN:  Add MVI, trace elements Q4hr SSI and adjust as needed - patient currently experiencing hypoglycemia  D5 @ 4250mL/hr and 1/2 NS @75mL /hr  Monitor TPN labs F/U 12/15  Simpson,Michael L 04/03/2017,8:30 PM

## 2017-04-03 NOTE — Progress Notes (Addendum)
[facility] Consultation     Date: 04/03/2017,   MRN# [id number] Rebecca Webb 1929-03-26 Code Status:     Code Status Orders  (From admission, onward)        Start     Ordered   03/31/17 2233  Do not attempt resuscitation (DNR)  Continuous    Question Answer Comment  In the event of cardiac or respiratory ARREST Do not call a "code blue"   In the event of cardiac or respiratory ARREST Do not perform Intubation, CPR, defibrillation or ACLS   In the event of cardiac or respiratory ARREST Use medication by any route, position, wound care, and other measures to relive pain and suffering. May use oxygen, suction and manual treatment of airway obstruction as needed for comfort.      03/31/17 2232      SUBJECTIVE:   Overnight Menz noted.  Patient has bradycardia.  Got 0.5 mg atropine yesterday evening.  Amiodarone drip was shut off yesterday evening. Remains bradycardic in the high 40s low 50s.  Neo-Synephrine weaned down to 35 mics. Blood cultures have remained negative. Chest x-ray shows left basilar atelectasis/possible consolidation with mild left-sided effusion.  Patient is afebrile.   MEDICATIONS    Current Medication:   Current Facility-Administered Medications:  .  0.45 % sodium chloride infusion, , Intravenous, Continuous, Cristy HiltsKhan, Katelee Schupp, MD, Last Rate: 75 mL/hr at 04/03/17 0930 .  acetaminophen (TYLENOL) tablet 650 mg, 650 mg, Oral, Q6H PRN **OR** acetaminophen (TYLENOL) suppository 650 mg, 650 mg, Rectal, Q6H PRN, Oralia ManisWillis, David, MD .  calcium gluconate 2 g in sodium chloride 0.9 % 100 mL IVPB, 2 g, Intravenous, Once, Cristy HiltsKhan, Sharmarke Cicio, MD, Last Rate: 120 mL/hr at 04/03/17 1100, 2 g at 04/03/17 1100 .  dextrose 5 % solution, , Intravenous, Continuous, Cristy HiltsKhan, Fallan Mccarey, MD, Last Rate: 50 mL/hr at 04/03/17 0600 .  heparin injection 5,000 Units, 5,000 Units, Subcutaneous, Q8H, Oralia ManisWillis, David, MD, 5,000 Units at 04/03/17 [phone] .  insulin aspart (novoLOG) injection  0-9 Units, 0-9 Units, Subcutaneous, Q4H, Cristy HiltsKhan, Dezyrae Kensinger, MD .  magnesium sulfate IVPB 2 g 50 mL, 2 g, Intravenous, Once, Cristy HiltsKhan, Tally Mattox, MD .  metroNIDAZOLE (FLAGYL) IVPB 500 mg, 500 mg, Intravenous, Q8H, Erin FullingKasa, Kurian, MD, Stopped at 04/03/17 0516 .  norepinephrine (LEVOPHED) 4 mg in dextrose 5 % 250 mL (0.016 mg/mL) infusion, 0-40 mcg/min, Intravenous, Continuous, Erin FullingKasa, Kurian, MD, Stopped at 04/02/17 1349 .  ondansetron (ZOFRAN) tablet 4 mg, 4 mg, Oral, Q6H PRN **OR** ondansetron (ZOFRAN) injection 4 mg, 4 mg, Intravenous, Q6H PRN, Oralia ManisWillis, David, MD .  pantoprazole (PROTONIX) injection 40 mg, 40 mg, Intravenous, Q24H, Cristy HiltsKhan, Caz Weaver, MD, 40 mg at 04/02/17 1307 .  phenylephrine (NEO-SYNEPHRINE) 10 mg in sodium chloride 0.9 % 250 mL (0.04 mg/mL) infusion, 0-400 mcg/min, Intravenous, Titrated, Cristy HiltsKhan, Chae Shuster, MD, Last Rate: 60 mL/hr at 04/03/17 0600, 40 mcg/min at 04/03/17 0600 .  potassium chloride 10 mEq in 50 mL *CENTRAL LINE* IVPB, 10 mEq, Intravenous, Q1 Hr x 5, Cristy HiltsKhan, Deniesha Stenglein, MD .  vancomycin (VANCOCIN) 500 mg in sodium chloride irrigation 0.9 % 100 mL ENEMA, 500 mg, Rectal, Q6H, Tukov, Magadalene S, NP, 500 mg at 04/03/17 0516      VS: BP (!) 142/76   Pulse (!) 45   Temp (!) 97.5 F (36.4 C) (Oral)   Resp (!) 22   Ht 5' (1.524 m)   Wt 102 lb 1.2 oz (46.3 kg)   SpO2 99%   BMI 19.93 kg/m  PHYSICAL EXAM   Physical Exam Awake. Not following commands. CVS S1, S2, 0. Chest is clear to auscultation bilaterally with no rales rhonchi wheezes. Abdomen is soft, nontender, nondistended.  Bowel sounds are positive. No lower extremity edema bilaterally.     LABS    Recent Labs    03/31/17 1923 04/01/17 0421 04/02/17 0350 04/02/17 1145 04/02/17 1803 04/03/17 0405 04/03/17 0845  HGB 13.6 12.1  --  10.6* 10.6*  --  10.2*  HCT 41.5 37.7  --  32.5* 32.5*  --  31.4*  MCV 93.5 93.0  --  92.3  --   --  92.8  WBC 15.8* 18.7*  --  11.4*  --   --  9.2  BUN 73* 63* 35* 28* 23* 18  --     CREATININE 3.50* 2.89* 1.63* 1.22* 1.11* 0.99  --   GLUCOSE 115* 208* 86 113* 81 90  --   CALCIUM 8.3* 7.2* 7.4* 6.9* 7.2* 7.1*  --   INR 1.16  --   --   --   --   --   --   ,    No results for input(s): PH in the last 72 hours.  Invalid input(s): PCO2, PO2, BASEEXCESS, BASEDEFICITE, TFT    CULTURE RESULTS   Recent Results (from the past 240 hour(s))  Blood Culture (routine x 2)     Status: None (Preliminary result)   Collection Time: 03/31/17  7:23 PM  Result Value Ref Range Status   Specimen Description BLOOD RIGHT CENTRAL LINE  Final   Special Requests   Final    BOTTLES DRAWN AEROBIC AND ANAEROBIC Blood Culture adequate volume   Culture NO GROWTH 3 DAYS  Final   Report Status PENDING  Incomplete  Blood Culture (routine x 2)     Status: None (Preliminary result)   Collection Time: 03/31/17  7:23 PM  Result Value Ref Range Status   Specimen Description BLOOD RIGHT CENTRAL LINE  Final   Special Requests   Final    BOTTLES DRAWN AEROBIC AND ANAEROBIC Blood Culture adequate volume   Culture NO GROWTH 3 DAYS  Final   Report Status PENDING  Incomplete  MRSA PCR Screening     Status: None   Collection Time: 03/31/17 10:13 PM  Result Value Ref Range Status   MRSA by PCR NEGATIVE NEGATIVE Final    Comment:        The GeneXpert MRSA Assay (FDA approved for NASAL specimens only), is one component of a comprehensive MRSA colonization surveillance program. It is not intended to diagnose MRSA infection nor to guide or monitor treatment for MRSA infections.   C difficile quick scan w PCR reflex     Status: Abnormal   Collection Time: 03/31/17 10:13 PM  Result Value Ref Range Status   C Diff antigen POSITIVE (A) NEGATIVE Final   C Diff toxin POSITIVE (A) NEGATIVE Final   C Diff interpretation Toxin producing C. difficile detected.  Final    Comment: CRITICAL RESULT CALLED TO, READ BACK BY AND VERIFIED WITH: BETH BUONO ON 03/31/17 AT 2330 JAG   Gastrointestinal Panel by  PCR , Stool     Status: Abnormal   Collection Time: 03/31/17 10:13 PM  Result Value Ref Range Status   Campylobacter species NOT DETECTED NOT DETECTED Final   Plesimonas shigelloides NOT DETECTED NOT DETECTED Final   Salmonella species NOT DETECTED NOT DETECTED Final   Yersinia enterocolitica NOT DETECTED NOT DETECTED Final   Vibrio species NOT  DETECTED NOT DETECTED Final   Vibrio cholerae NOT DETECTED NOT DETECTED Final   Enteroaggregative E coli (EAEC) NOT DETECTED NOT DETECTED Final   Enteropathogenic E coli (EPEC) DETECTED (A) NOT DETECTED Final    Comment: RESULT CALLED TO, READ BACK BY AND VERIFIED WITH: RENE BADD ON 04/01/17 AT 0006 JAG    Enterotoxigenic E coli (ETEC) NOT DETECTED NOT DETECTED Final   Shiga like toxin producing E coli (STEC) NOT DETECTED NOT DETECTED Final   Shigella/Enteroinvasive E coli (EIEC) NOT DETECTED NOT DETECTED Final   Cryptosporidium NOT DETECTED NOT DETECTED Final   Cyclospora cayetanensis NOT DETECTED NOT DETECTED Final   Entamoeba histolytica NOT DETECTED NOT DETECTED Final   Giardia lamblia NOT DETECTED NOT DETECTED Final   Adenovirus F40/41 NOT DETECTED NOT DETECTED Final   Astrovirus NOT DETECTED NOT DETECTED Final   Norovirus GI/GII NOT DETECTED NOT DETECTED Final   Rotavirus A NOT DETECTED NOT DETECTED Final   Sapovirus (I, II, IV, and V) NOT DETECTED NOT DETECTED Final          IMAGING    Dg Chest Port 1 View  Result Date: 04/02/2017 CLINICAL DATA:  Bedside central venous catheter placement. EXAM: PORTABLE CHEST 1 VIEW COMPARISON:  03/31/2017, 02/05/2017 and earlier. FINDINGS: Right jugular central venous catheter tip projects at or near the cavoatrial junction. No evidence of pneumothorax or mediastinal hematoma. Development of consolidation in the left lower lobe and a possible small left pleural effusion since the examination 2 days ago. Lungs remain clear otherwise. IMPRESSION: 1. Right jugular central venous catheter tip projects  at or near the cavoatrial junction. No acute complicating features. 2. Developing left lower lobe atelectasis and/or pneumonia and possible left pleural effusion since the examination 2 days ago. Electronically Signed   By: Hulan Saas M.D.   On: 04/02/2017 17:42     ASSESSMENT/PLAN   83 years old lady with past medical history significant for atrial fibrillation, CKD, depression, dementia, GERD, dyslipidemia, hypertension, vitamin D deficiency, hip fracture in October 2018, kidney donation who has been admitted to the ICU with septic shock and C. difficile colitis.  Problem list: Severe sepsis with shock Dehydration C. difficile colitis Bradycardia Acute on chronic kidney injury - improving Encephalopathy Hypernatremia improving Hypokalemia Troponin elevation Pressure ulcer  Overall the patient remains critically ill.  Neo-Synephrine requirements down to 35 mics.  Given the patient's bradycardia, I will DC the Neo-Synephrine and start dopamine. Titrate to keep mean arterial pressure more than 65. Follow cultures Chest x-ray reviewed and report noted.  Patient is afebrile, pro calcitonin is negative and white count is normal as well.  Doubt that she is developing a pneumonia.  Continue monitoring symptomatically. Continue vancomycin enemas and IV Flagyl.  Patient unable to take p.o. vancomycin given the inability to pass an OG tube IR. CVP measured this morning and was 3.  Patient was given normal saline fluid bolus.  Target CVP would be 8.  We will continue bolusing as needed to reduce the CVP target.  Start half-normal saline at 75 mL's per hour  Gastroenterology was consulted for endoscopy for stricture and possible malignancy.  They want to hold off any endoscopic investigation until the patient comes off vasopressors.  Appreciate assistance.   GFR improving.  Monitor daily creatinine and GFR.  Patient's encephalopathy could be related to the hypernatremia. Hypernatremia  is improving. Continue D5 water at 50 mL's per hour. Check CT scan of the head.  Troponin elevation is likely secondary to severe  sepsis as well as acute on chronic kidney injury.  Troponins trending down yesterday.  ?  Cause for bradycardia.  Amiodarone held since last night.  Will check EKG.    Follow Echo results Keep potassium more than 4 and magnesium more than 2.  Potassium magnesium and calcium replaced this morning.  Repeat potassium and magnesium levels at 7 PM.  IJ triple-lumen catheter withdrawn yesterday as recommended by radiology.  Continue holding home metoprolol and Cardizem given the patient's hypotension and now bradycardia. Continue holding home aspirin given that the patient cannot take anything p.o.  Wound care consult for pressure ulcer  As the patient cannot get an OG tube and has not been getting any nutrition for the past 7 days, will start TPN.  Hemoglobin stable.  No overt bleed.  PPI as above.  No melena. Transfuse for hemoglobin less than 7.  Heparin subcu for DVT prophylaxis Protonix for GI prophylaxis. Insulin sliding scale for glycemic control.  CODE STATUS DNR/DNI.  Discussed at length with the patient's granddaughter in the room.  Above plan discussed.  CC time 50 minutes.  Cristy Hilts, M.D.  Pulmonary & Critical Care Medicine   Patient is malnourished.  Hence with starting her on TPN

## 2017-04-04 LAB — GLUCOSE, CAPILLARY
GLUCOSE-CAPILLARY: 92 mg/dL (ref 65–99)
GLUCOSE-CAPILLARY: 120 mg/dL — AB (ref 65–99)
Glucose-Capillary: 112 mg/dL — ABNORMAL HIGH (ref 65–99)
Glucose-Capillary: 123 mg/dL — ABNORMAL HIGH (ref 65–99)
Glucose-Capillary: 123 mg/dL — ABNORMAL HIGH (ref 65–99)
Glucose-Capillary: 86 mg/dL (ref 65–99)

## 2017-04-04 LAB — COMPREHENSIVE METABOLIC PANEL
ALBUMIN: 2 g/dL — AB (ref 3.5–5.0)
ALT: 40 U/L (ref 14–54)
AST: 46 U/L — AB (ref 15–41)
Alkaline Phosphatase: 100 U/L (ref 38–126)
Anion gap: 7 (ref 5–15)
BUN: 10 mg/dL (ref 6–20)
CO2: 20 mmol/L — AB (ref 22–32)
CREATININE: 0.82 mg/dL (ref 0.44–1.00)
Calcium: 6.9 mg/dL — ABNORMAL LOW (ref 8.9–10.3)
Chloride: 112 mmol/L — ABNORMAL HIGH (ref 101–111)
GFR calc non Af Amer: >60 mL/min (ref >60)
Glucose, Bld: 122 mg/dL — ABNORMAL HIGH (ref 65–99)
Potassium: 3.4 mmol/L — ABNORMAL LOW (ref 3.5–5.1)
SODIUM: 139 mmol/L (ref 135–145)
Total Bilirubin: 0.4 mg/dL (ref 0.3–1.2)
Total Protein: 5 g/dL — ABNORMAL LOW (ref 6.5–8.1)

## 2017-04-04 LAB — CBC
HEMATOCRIT: 37.1 % (ref 35.0–47.0)
HEMOGLOBIN: 12.2 g/dL (ref 12.0–16.0)
MCH: 30.6 pg (ref 26.0–34.0)
MCHC: 33 g/dL (ref 32.0–36.0)
MCV: 92.9 fL (ref 80.0–100.0)
Platelets: 139 10*3/uL — ABNORMAL LOW (ref 150–440)
RBC: 3.99 MIL/uL (ref 3.80–5.20)
RDW: 17 % — AB (ref 11.5–14.5)
WBC: 8 10*3/uL (ref 3.6–11.0)

## 2017-04-04 LAB — PHOSPHORUS
PHOSPHORUS: 2.7 mg/dL (ref 2.5–4.6)
PHOSPHORUS: 2.2 mg/dL — AB (ref 2.5–4.6)

## 2017-04-04 LAB — DIFFERENTIAL
Basophils Absolute: 0 10*3/uL (ref 0–0.1)
Basophils Relative: 0 %
EOS PCT: 1 %
Eosinophils Absolute: 0.1 10*3/uL (ref 0–0.7)
LYMPHS PCT: 19 %
Lymphs Abs: 1.5 10*3/uL (ref 1.0–3.6)
MONO ABS: 0.5 10*3/uL (ref 0.2–0.9)
MONOS PCT: 7 %
NEUTROS ABS: 5.9 10*3/uL (ref 1.4–6.5)
Neutrophils Relative %: 73 %

## 2017-04-04 LAB — OSMOLALITY, URINE: OSMOLALITY UR: 295 mosm/kg — AB (ref 300–900)

## 2017-04-04 LAB — TRIGLYCERIDES: Triglycerides: 42 mg/dL (ref <150)

## 2017-04-04 LAB — OSMOLALITY: OSMOLALITY: 300 mosm/kg — AB (ref 275–295)

## 2017-04-04 LAB — SODIUM: Sodium: 140 mmol/L (ref 135–145)

## 2017-04-04 LAB — MAGNESIUM: Magnesium: 1.6 mg/dL — ABNORMAL LOW (ref 1.7–2.4)

## 2017-04-04 LAB — SODIUM, URINE, RANDOM: SODIUM UR: 85 mmol/L

## 2017-04-04 MED ORDER — STERILE WATER FOR INJECTION IJ SOLN
INTRAMUSCULAR | Status: AC
  Administered 2017-04-04: 10 mL
  Filled 2017-04-04: qty 10

## 2017-04-04 MED ORDER — POTASSIUM CHLORIDE 10 MEQ/100ML IV SOLN
10.0000 meq | INTRAVENOUS | Status: AC
  Administered 2017-04-04 (×3): 10 meq via INTRAVENOUS
  Filled 2017-04-04 (×3): qty 100

## 2017-04-04 MED ORDER — POTASSIUM PHOSPHATES 15 MMOLE/5ML IV SOLN: 30.0000 mmol | Freq: Once | INTRAVENOUS | Status: DC

## 2017-04-04 MED ORDER — POTASSIUM CHLORIDE 10 MEQ/100ML IV SOLN
10.0000 meq | INTRAVENOUS | Status: AC
  Administered 2017-04-04: 10 meq via INTRAVENOUS
  Filled 2017-04-04 (×2): qty 100

## 2017-04-04 MED ORDER — SODIUM CHLORIDE 0.9 % IV SOLN
INTRAVENOUS | Status: DC
  Administered 2017-04-04 (×2): via INTRAVENOUS

## 2017-04-04 MED ORDER — POTASSIUM PHOSPHATES 15 MMOLE/5ML IV SOLN
10.0000 mmol | Freq: Once | INTRAVENOUS | Status: AC
  Administered 2017-04-04: 10 mmol via INTRAVENOUS
  Filled 2017-04-04: qty 3.33

## 2017-04-04 MED ORDER — MAGNESIUM SULFATE 2 GM/50ML IV SOLN
2.0000 g | Freq: Once | INTRAVENOUS | Status: AC
  Administered 2017-04-04: 2 g via INTRAVENOUS
  Filled 2017-04-04: qty 50

## 2017-04-04 MED ORDER — TRACE MINERALS CR-CU-MN-SE-ZN 10-1000-500-60 MCG/ML IV SOLN
INTRAVENOUS | Status: AC
  Administered 2017-04-04: 19:00:00 via INTRAVENOUS
  Filled 2017-04-04: qty 1320

## 2017-04-04 MED ORDER — ORAL CARE MOUTH RINSE
15.0000 mL | Freq: Two times a day (BID) | OROMUCOSAL | Status: DC
  Administered 2017-04-04 – 2017-04-11 (×11): 15 mL via OROMUCOSAL

## 2017-04-04 NOTE — Progress Notes (Signed)
Pharmacy Electrolyte Monitoring Consult:  Pharmacy consulted to assist in monitoring and replacing electrolytes in this 83 y.o. female with a h/o atrial fibrillation  admitted on 03/31/2017 with Altered Mental Status Patient with C diff.  12/12: Unable to place Dobhoff, IR consulted for tube placement.   Labs:  Sodium (mmol/L)  Date Value  04/04/2017 139   Potassium (mmol/L)  Date Value  04/04/2017 3.4 (L)   Magnesium (mg/dL)  Date Value  64/40/347412/15/2018 1.6 (L)   Phosphorus (mg/dL)  Date Value  25/95/638712/15/2018 2.7   Calcium (mg/dL)  Date Value  56/43/329512/15/2018 6.9 (L)   Albumin (g/dL)  Date Value  18/84/166012/15/2018 2.0 (L)    Plan: Potassium is still 2.9. Patient has received  KCL 50mEq IV so far. Spoke with nurse, patient still have  KCl 10 meq IV x 4  left to infuse. Nurse will order F/U K+ lab once potassium infusions are complete and will order additional replacement if needed.   Will recheck all electrolytes with AM labs.    12/15 AM K+ 3.4, Mg 1.2. KCl 20 mEq IV and magnesium sulfate 2 grams IV ordered. F/u labs tomorrow AM.  Erich MontaneMcBane,Estefanie Cornforth S, PharmD, BCPS Clinical Pharmacist 04/04/2017 6:31 AM

## 2017-04-04 NOTE — Progress Notes (Addendum)
[facility]     Date: 04/04/2017,   MRN# [id number] Jerelene ReddenBirtie B [birth year]ender March 28, 1929 Code Status:     Code Status Orders  (From admission, onward)        Start     Ordered   03/31/17 2233  Do not attempt resuscitation (DNR)  Continuous    Question Answer Comment  In the event of cardiac or respiratory ARREST Do not call a "code blue"   In the event of cardiac or respiratory ARREST Do not perform Intubation, CPR, defibrillation or ACLS   In the event of cardiac or respiratory ARREST Use medication by any route, position, wound care, and other measures to relive pain and suffering. May use oxygen, suction and manual treatment of airway obstruction as needed for comfort.      03/31/17 2232      SUBJECTIVE:   No acute issues overnight. Given her bradycardia, patient's Neo-Synephrine was switched to dopamine yesterday.  Bradycardia resolved after the patient was started on dopamine.  Dopamine requirements have improved and are down to 10 mics. Patient was bolused with IV fluids yesterday for low CVP's.  Blood cultures have remained negative.  Hyponatremia resolved.  CT scan of the head was negative for any acute intracranial events  MEDICATIONS    Current Medication:   Current Facility-Administered Medications:  .  Marland Kitchen.TPN (CLINIMIX-E) Adult, , Intravenous, Continuous TPN, Katha HammingKonidena, Snehalatha, MD, Last Rate: 25 mL/hr at 04/03/17 1825 .  0.9 %  sodium chloride infusion, , Intravenous, Continuous, Cristy HiltsKhan, Ludy Messamore, MD, Last Rate: 75 mL/hr at 04/04/17 0736 .  acetaminophen (TYLENOL) tablet 650 mg, 650 mg, Oral, Q6H PRN **OR** acetaminophen (TYLENOL) suppository 650 mg, 650 mg, Rectal, Q6H PRN, Oralia ManisWillis, David, MD .  DOPamine (INTROPIN) 800 mg in dextrose 5 % 250 mL (3.2 mg/mL) infusion, 0-20 mcg/kg/min, Intravenous, Titrated, Cristy HiltsKhan, Gesselle Fitzsimons, MD, Last Rate: 8.7 mL/hr at 04/03/17 1825, 10.022 mcg/kg/min at 04/03/17 1825 .  heparin injection 5,000 Units, 5,000  Units, Subcutaneous, Q8H, Oralia ManisWillis, David, MD, 5,000 Units at 04/04/17 [phone] .  insulin aspart (novoLOG) injection 0-9 Units, 0-9 Units, Subcutaneous, Q4H, Cristy HiltsKhan, Aiden Helzer, MD, 1 Units at 04/04/17 [phone] .  magnesium sulfate IVPB 2 g 50 mL, 2 g, Intravenous, Once, Kasa, Kurian, MD .  metroNIDAZOLE (FLAGYL) IVPB 500 mg, 500 mg, Intravenous, Q8H, Erin FullingKasa, Kurian, MD, Stopped at 04/04/17 0536 .  ondansetron (ZOFRAN) tablet 4 mg, 4 mg, Oral, Q6H PRN **OR** ondansetron (ZOFRAN) injection 4 mg, 4 mg, Intravenous, Q6H PRN, Oralia ManisWillis, David, MD .  pantoprazole (PROTONIX) injection 40 mg, 40 mg, Intravenous, Q24H, Cristy HiltsKhan, Beadie Matsunaga, MD, 40 mg at 04/03/17 1258 .  potassium chloride 10 mEq in 100 mL IVPB, 10 mEq, Intravenous, Q1 Hr x 2, Kasa, Kurian, MD, Last Rate: 100 mL/hr at 04/04/17 0653, 10 mEq at 04/04/17 0653 .  sodium chloride 0.9 % bolus 1,000 mL, 1,000 mL, Intravenous, Once, Cristy HiltsKhan, Merrilyn Legler, MD .  vancomycin (VANCOCIN) 500 mg in sodium chloride irrigation 0.9 % 100 mL ENEMA, 500 mg, Rectal, Q6H, Tukov, Magadalene S, NP, 500 mg at 04/04/17 0538      VS: BP (!) 123/91   Pulse 65   Temp 97.7 F (36.5 C) (Axillary)   Resp 15   Ht 5' (1.524 m)   Wt 105 lb 2.6 oz (47.7 kg)   SpO2 100%   BMI 20.54 kg/m      PHYSICAL EXAM   Physical Exam Awake. Not following commands. CVS S1, S2, 0. Chest is clear to auscultation bilaterally with  no rales rhonchi wheezes. Abdomen is soft, nontender, nondistended.  Bowel sounds are positive. No lower extremity edema bilaterally.     LABS    Recent Labs    04/02/17 1145 04/02/17 1803 04/03/17 0405 04/03/17 0845 04/04/17 0514  HGB 10.6* 10.6*  --  10.2* 12.2  HCT 32.5* 32.5*  --  31.4* 37.1  MCV 92.3  --   --  92.8 92.9  WBC 11.4*  --   --  9.2 8.0  BUN 28* 23* 18  --  10  CREATININE 1.22* 1.11* 0.99  --  0.82  GLUCOSE 113* 81 90  --  122*  CALCIUM 6.9* 7.2* 7.1*  --  6.9*  ,    No results for input(s): PH in the last 72 hours.  Invalid input(s): PCO2,  PO2, BASEEXCESS, BASEDEFICITE, TFT    CULTURE RESULTS   Recent Results (from the past 240 hour(s))  Blood Culture (routine x 2)     Status: None (Preliminary result)   Collection Time: 03/31/17  7:23 PM  Result Value Ref Range Status   Specimen Description BLOOD RIGHT CENTRAL LINE  Final   Special Requests   Final    BOTTLES DRAWN AEROBIC AND ANAEROBIC Blood Culture adequate volume   Culture NO GROWTH 4 DAYS  Final   Report Status PENDING  Incomplete  Blood Culture (routine x 2)     Status: None (Preliminary result)   Collection Time: 03/31/17  7:23 PM  Result Value Ref Range Status   Specimen Description BLOOD RIGHT CENTRAL LINE  Final   Special Requests   Final    BOTTLES DRAWN AEROBIC AND ANAEROBIC Blood Culture adequate volume   Culture NO GROWTH 4 DAYS  Final   Report Status PENDING  Incomplete  MRSA PCR Screening     Status: None   Collection Time: 03/31/17 10:13 PM  Result Value Ref Range Status   MRSA by PCR NEGATIVE NEGATIVE Final    Comment:        The GeneXpert MRSA Assay (FDA approved for NASAL specimens only), is one component of a comprehensive MRSA colonization surveillance program. It is not intended to diagnose MRSA infection nor to guide or monitor treatment for MRSA infections.   C difficile quick scan w PCR reflex     Status: Abnormal   Collection Time: 03/31/17 10:13 PM  Result Value Ref Range Status   C Diff antigen POSITIVE (A) NEGATIVE Final   C Diff toxin POSITIVE (A) NEGATIVE Final   C Diff interpretation Toxin producing C. difficile detected.  Final    Comment: CRITICAL RESULT CALLED TO, READ BACK BY AND VERIFIED WITH: BETH BUONO ON 03/31/17 AT 2330 JAG   Gastrointestinal Panel by PCR , Stool     Status: Abnormal   Collection Time: 03/31/17 10:13 PM  Result Value Ref Range Status   Campylobacter species NOT DETECTED NOT DETECTED Final   Plesimonas shigelloides NOT DETECTED NOT DETECTED Final   Salmonella species NOT DETECTED NOT DETECTED  Final   Yersinia enterocolitica NOT DETECTED NOT DETECTED Final   Vibrio species NOT DETECTED NOT DETECTED Final   Vibrio cholerae NOT DETECTED NOT DETECTED Final   Enteroaggregative E coli (EAEC) NOT DETECTED NOT DETECTED Final   Enteropathogenic E coli (EPEC) DETECTED (A) NOT DETECTED Final    Comment: RESULT CALLED TO, READ BACK BY AND VERIFIED WITH: RENE BADD ON 04/01/17 AT 0006 JAG    Enterotoxigenic E coli (ETEC) NOT DETECTED NOT DETECTED Final   Shiga like  toxin producing E coli (STEC) NOT DETECTED NOT DETECTED Final   Shigella/Enteroinvasive E coli (EIEC) NOT DETECTED NOT DETECTED Final   Cryptosporidium NOT DETECTED NOT DETECTED Final   Cyclospora cayetanensis NOT DETECTED NOT DETECTED Final   Entamoeba histolytica NOT DETECTED NOT DETECTED Final   Giardia lamblia NOT DETECTED NOT DETECTED Final   Adenovirus F40/41 NOT DETECTED NOT DETECTED Final   Astrovirus NOT DETECTED NOT DETECTED Final   Norovirus GI/GII NOT DETECTED NOT DETECTED Final   Rotavirus A NOT DETECTED NOT DETECTED Final   Sapovirus (I, II, IV, and V) NOT DETECTED NOT DETECTED Final          IMAGING    Ct Head Wo Contrast  Result Date: 04/03/2017 CLINICAL DATA:  Septic shock.  Dementia.  Mental status changes. EXAM: CT HEAD WITHOUT CONTRAST TECHNIQUE: Contiguous axial images were obtained from the base of the skull through the vertex without intravenous contrast. COMPARISON:  08/01/2013 FINDINGS: Brain: Chronic atrophy, temporal lobe predominant. Mild chronic small-vessel ischemic changes of the white matter. No sign of acute infarction, mass lesion, hemorrhage, hydrocephalus or extra-axial collection. Vascular: There is atherosclerotic calcification of the major vessels at the base of the brain. Skull: Normal Sinuses/Orbits: Clear/normal Other: None IMPRESSION: No acute or reversible finding. Atrophy with temporal lobe predominance. Electronically Signed   By: Paulina Fusi M.D.   On: 04/03/2017 16:04      ASSESSMENT/PLAN   83 years old lady with past medical history significant for atrial fibrillation, CKD, depression, dementia, GERD, dyslipidemia, hypertension, vitamin D deficiency, hip fracture in October 2018, kidney donation who has been admitted to the ICU with septic shock and C. difficile colitis.  Problem list: Severe sepsis with shock Dehydration  C. difficile colitis Bradycardia Acute on chronic kidney injury -resolved Encephalopathy Hypernatremia - resolved Hypokalemia Troponin elevation Pressure ulcer Malnutrition  Overall the patient remains critically ill.  Continue dopamine.  Titrate to keep mean arterial pressure more than 65. Measure CVP.  Bolus as needed to bring CVP up to 8.. Follow cultures Continue vancomycin enemas and IV Flagyl.  Patient unable to take p.o. vancomycin given the inability to pass an OG tube IR. IV fluids at 75 mL's per hour. Now that hypernatremia has resolved, will change 0.45NS to NS.  Gastroenterology was consulted for endoscopy for stricture and possible malignancy.  They want to hold off any endoscopic investigation until the patient comes off vasopressors.  Appreciate assistance.   GFR normalized.  Monitor daily creatinine and GFR.  Patient's encephalopathy is better but has not resolved.  She is opening her eyes spontaneously today.   ? Secondary to hypernatremia. CT brain 04/03/17 negative. Continue monitoring clinically.  Hypernatremia resolved. D/C D5W.  Troponin elevation is likely secondary to severe sepsis as well as acute on chronic kidney injury.  Troponins trending down 04/02/17  ?  Cause for bradycardia.  Amiodarone held since 04/02/17. Not bradycardic since changing vasopressors to dopamine. Echo shows EF 55 - 60%, systolic function was normal. Moderate TR.  Keep potassium more than 4 and magnesium more than 2.   Continue holding home metoprolol and Cardizem given the patient's hypotension and  bradycardia. Cannot give rectal ASA suppository as the patient has a rectal tube.  Wound care consult for pressure ulcer  TPN started for nutrition.  Hemoglobin stable.  No overt bleed.  PPI as above.  No melena. Transfuse for hemoglobin less than 7.  Heparin subcu for DVT prophylaxis Protonix for GI prophylaxis. Insulin sliding scale for glycemic control.  CODE STATUS DNR/DNI.  CC time 38 minutes.  Cristy Hilts, M.D.  Pulmonary & Critical Care Medicine   Urine output is high. Less likely to be DI given that her serum sodium has normalized but given that the patient was on D5W and received large volume of fluids yesterday, rise is serum sodium could be masked. Will check serum and urine osmolarity and urine sodium for DI.  CVP 16 - 18. Patient does not look like she is fluid overload. O2 requirements are not high. Continue IVF as clinically she needs them.   Cristy Hilts, M.D.  Pulmonary & Critical Care Medicine

## 2017-04-04 NOTE — Progress Notes (Signed)
Pharmacy Electrolyte Monitoring Consult:  Pharmacy consulted to assist in monitoring and replacing electrolytes in this 83 y.o. female with a h/o atrial fibrillation  admitted on 03/31/2017 with Altered Mental Status Patient with C diff.  12/12: Unable to place Dobhoff, IR consulted for tube placement.   Labs:  Sodium (mmol/L)  Date Value  04/04/2017 139   Potassium (mmol/L)  Date Value  04/04/2017 3.4 (L)   Magnesium (mg/dL)  Date Value  16/10/960412/15/2018 1.6 (L)   Phosphorus (mg/dL)  Date Value  54/09/811912/15/2018 2.7   Calcium (mg/dL)  Date Value  14/78/295612/15/2018 6.9 (L)   Albumin (g/dL)  Date Value  21/30/865712/15/2018 2.0 (L)    Plan:  12/15 AM K+ 3.4, Mg 1.6. Patient received one dose of K 10mEq this morning after lab was drawn. Will order additional K 10mEq x3. Patient also has magnesium sulfate 2 grams IV ordered. F/u labs tomorrow AM.  Yolanda BonineHannah Lifsey, PharmD Pharmacy Resident 04/04/2017 9:52 AM

## 2017-04-04 NOTE — Progress Notes (Signed)
PHARMACY - ADULT TOTAL PARENTERAL NUTRITION CONSULT NOTE   Pharmacy Consult for TPN Indication: no intake x 7 days   Patient Measurements: Height: 5' (152.4 cm) Weight: 105 lb 2.6 oz (47.7 kg) IBW/kg (Calculated) : 45.5 TPN AdjBW (KG): 40.2 Body mass index is 20.54 kg/m.   Assessment:  Pharmacy consulted for TPN management for 83 yo female being treated for Cdiff. Patient unable to receive tube feeds due to esophageal mass and stricture. Patient is at risk for refeeding syndrome.   12/15: K 3.4, Mag 1.6, Phos 2.7  Ca 6.9  Albumin 2.0   Albumin corrected Ca 8.5 MD has ordered KCL IV 30 meq x 1 and Magnesium 2 gram IV x 1. Patient received Magnesium 2 gram IV x 1 per pharmacist order; so a total of 4 grams IV of Magnesium has been given.  Phosphorus (mg/dL)  Date Value  65/78/469612/15/2018 2.2 (L)    12/16 PM: Kphos 10 mmol IV once and will f/u AM labs.   TPN Access: RIJ TPN start date: 12/14  Goal TPN rate is 55 ml/hr; if tolerated will advance to goal on 12/15  Current Nutrition:   Plan:  clinimix E 5/20 TPN at 25 mL/hr. Will increase to goal rate of 55 ml/hr today. Hold 20% lipid emulsion for first 7 days for ICU patients per ASPEN guidelines (Start date 12/21) This TPN provides 66 g of protein, 1320 g of dextrose which provides 1161 kCals per day, meeting 96% of patient needs  Electrolytes in TPN:  Add MVI, trace elements.  Diet: NPO  Q4hr SSI and adjust as needed - patient currently experiencing hypoglycemia,  SSI/24 hours= 3 units.  Current IV fluids: NS @75mL /hr  Monitor TPN labs. Will recheck Phos in pm since pt at risk of refeeding. F/U electrolytes in am  Rebecca HartChristy, Rebecca Webb D 04/04/2017,7:40 PM

## 2017-04-04 NOTE — Progress Notes (Signed)
[facility] [place] [facility]   PATIENT NAME: Rebecca Webb    MR#:  [id number]  DATE OF BIRTH:  January 05, 1929  SUBJECTIVE: Admitted for septic shock .  On TPN yesterday, on dopamine for septic shock, bradycardia.  CHIEF COMPLAINT:   Chief Complaint  Patient presents with  . Altered Mental Status    REVIEW OF SYSTEMS:    Review of Systems  Unable to perform ROS: Critical illness    Nutrition: npo Tolerating Diet: Tolerating PT:      DRUG ALLERGIES:   Allergies  Allergen Reactions  . Lisinopril Swelling    Tongue and lips was swollen.  . Haldol [Haloperidol Lactate] Rash    VITALS:  Blood pressure 103/65, pulse (!) 56, temperature 97.6 F (36.4 C), temperature source Axillary, resp. rate 13, height 5' (1.524 m), weight 47.7 kg (105 lb 2.6 oz), SpO2 100 %.  PHYSICAL EXAMINATION:   Physical Exam  GENERAL:  83 y.o.-year-old patient lying in the bed .  Appears cachectic. EYES: Pupils equal, round, reactive to light . No scleral icterus. Extraocular muscles intact.  HEENT: Head atraumatic, normocephalic. Oropharynx and nasopharynx clear.  NECK:  Supple, no jugular venous distention. No thyroid enlargement, no tenderness.  LUNGS: Normal breath sounds bilaterally, no wheezing, rales,rhonchi or crepitation. No use of accessory muscles of respiration.  CARDIOVASCULAR: S1, S2 normal. No murmurs, rubs, or gallops.  ABDOMEN: Soft, nontender, nondistended. Bowel sounds present. No organomegaly or mass.  EXTREMITIES: No pedal edema, cyanosis, or clubbing.  NEUROLOGIC unable to do  neurological exam because of her dementia.  PSYCHIATRIC: Patient is demented.   SKIN: No obvious rash, lesion, or ulcer.    LABORATORY PANEL:   CBC Recent Labs  Lab 04/04/17 0514  WBC 8.0  HGB 12.2  HCT 37.1  PLT 139*   ------------------------------------------------------------------------------------------------------------------  Chemistries  Recent Labs   Lab 04/04/17 0514  NA 139  K 3.4*  CL 112*  CO2 20*  GLUCOSE 122*  BUN 10  CREATININE 0.82  CALCIUM 6.9*  MG 1.6*  AST 46*  ALT 40  ALKPHOS 100  BILITOT 0.4   ------------------------------------------------------------------------------------------------------------------  Cardiac Enzymes Recent Labs  Lab 04/02/17 1145  TROPONINI 0.24*   ------------------------------------------------------------------------------------------------------------------  RADIOLOGY:  Ct Head Wo Contrast  Result Date: 04/03/2017 CLINICAL DATA:  Septic shock.  Dementia.  Mental status changes. EXAM: CT HEAD WITHOUT CONTRAST TECHNIQUE: Contiguous axial images were obtained from the base of the skull through the vertex without intravenous contrast. COMPARISON:  08/01/2013 FINDINGS: Brain: Chronic atrophy, temporal lobe predominant. Mild chronic small-vessel ischemic changes of the white matter. No sign of acute infarction, mass lesion, hemorrhage, hydrocephalus or extra-axial collection. Vascular: There is atherosclerotic calcification of the major vessels at the base of the brain. Skull: Normal Sinuses/Orbits: Clear/normal Other: None IMPRESSION: No acute or reversible finding. Atrophy with temporal lobe predominance. Electronically Signed   By: Paulina FusiMark  Shogry M.D.   On: 04/03/2017 16:04   Dg Chest Port 1 View  Result Date: 04/02/2017 CLINICAL DATA:  Bedside central venous catheter placement. EXAM: PORTABLE CHEST 1 VIEW COMPARISON:  03/31/2017, 02/05/2017 and earlier. FINDINGS: Right jugular central venous catheter tip projects at or near the cavoatrial junction. No evidence of pneumothorax or mediastinal hematoma. Development of consolidation in the left lower lobe and a possible small left pleural effusion since the examination 2 days ago. Lungs remain clear otherwise. IMPRESSION: 1. Right jugular central venous catheter tip projects at or near the cavoatrial junction. No acute complicating features.  2. Developing left lower lobe atelectasis and/or pneumonia and possible left pleural effusion since the examination 2 days ago. Electronically Signed   By: Hulan Saashomas  Lawrence M.D.   On: 04/02/2017 17:42     ASSESSMENT AND PLAN:   Principal Problem:   Sepsis (HCC) Active Problems:   A-fib (HCC)   Alzheimer's dementia   Benign essential hypertension   AKI (acute kidney injury) (HCC)   Elevated troponin   C. difficile diarrhea   Pressure injury of skin   #1 sepsis present on admission secondary to C. difficile colitis.  Patient is in septic shock so she is in ICU getting IV fluids, pressors, antibiotics with Flagyl, vancomycin enemas.  2.  Hyponatremia, hypokalemia, acute renal failure secondary to septic shock: Improving, hyponatremia, hypokalemia getting corrected.  Acute renal failure also improving, making good urine output. 3.. Septic shock ;stopped metoprolol, Cardizem.  Secondary to hypotension. For dysphagia, possible esophageal stricture/mass , patient unable to get EGD because of septic shock, loculate abnormalities, advanced age unable to pass NG tube yesterday, barium study performed.. Prognosis really poor, discussed with granddaughters at bedside. #4 .hypokalemia: Improving. #5 hyponatremia; getting better. Plan  to continue TPN, pressors, IV Flagyl, vancomycin  Enemas. see how she does. All the records are reviewed and case discussed with Care Management/Social Workerr. Management plans discussed with the patient, family and they are in agreement.  CODE STATUS: DNR  TOTAL TIME TAKING CARE OF THIS PATIENT: 35minutes.   Very sick unable to plan for discharge disposition.  Katha HammingSnehalatha Jalon Blackwelder M.D on 04/04/2017 at 1:00 PM  Between 7am to 6pm - Pager - [phone]  After 6pm go to [email or web address] - password [email or web address] [facility]  Office  [phone]  CC: Primary care physician; Leotis ShamesSingh, Jasmine, MD

## 2017-04-04 NOTE — Progress Notes (Signed)
Patient has been awake almost all shift. Continue on dopamine gtt and TPN. Vanc per rectum, has flexiseal. No indications of pain or discomfort. Nonverbal this shift. Eyes open spontaneously and too pain but does not follow command.

## 2017-04-04 NOTE — Progress Notes (Signed)
PHARMACY - ADULT TOTAL PARENTERAL NUTRITION CONSULT NOTE   Pharmacy Consult for TPN Indication: no intake x 7 days   Patient Measurements: Height: 5' (152.4 cm) Weight: 105 lb 2.6 oz (47.7 kg) IBW/kg (Calculated) : 45.5 TPN AdjBW (KG): 40.2 Body mass index is 20.54 kg/m.   Assessment:  Pharmacy consulted for TPN management for 83 yo female being treated for Cdiff. Patient unable to receive tube feeds due to esophageal mass and stricture. Patient is at risk for refeeding syndrome.   12/15: K 3.4, Mag 1.6, Phos 2.7  Ca 6.9  Albumin 2.0   Albumin corrected Ca 8.5 MD has ordered KCL IV 30 meq x 1 and Magnesium 2 gram IV x 1. Patient received Magnesium 2 gram IV x 1 per pharmacist order; so a total of 4 grams IV of Magnesium has been given.   TPN Access: RIJ TPN start date: 12/14  Goal TPN rate is 55 ml/hr; if tolerated will advance to goal on 12/15  Current Nutrition:   Plan:  clinimix E 5/20 TPN at 25 mL/hr. Will increase to goal rate of 55 ml/hr today. Hold 20% lipid emulsion for first 7 days for ICU patients per ASPEN guidelines (Start date 12/21) This TPN provides 66 g of protein, 1320 g of dextrose which provides 1161 kCals per day, meeting 96% of patient needs  Electrolytes in TPN:  Add MVI, trace elements.  Diet: NPO  Q4hr SSI and adjust as needed - patient currently experiencing hypoglycemia,  SSI/24 hours= 3 units.  Current IV fluids: NS @75mL /hr  Monitor TPN labs. Will recheck Phos in pm since pt at risk of refeeding. F/U electrolytes in am  Tiesha Marich A 04/04/2017,10:54 AM

## 2017-04-05 DIAGNOSIS — A0472 Enterocolitis due to Clostridium difficile, not specified as recurrent: Secondary | ICD-10-CM

## 2017-04-05 DIAGNOSIS — R6521 Severe sepsis with septic shock: Secondary | ICD-10-CM

## 2017-04-05 DIAGNOSIS — A419 Sepsis, unspecified organism: Secondary | ICD-10-CM

## 2017-04-05 LAB — CULTURE, BLOOD (ROUTINE X 2)
Culture: NO GROWTH
Culture: NO GROWTH
SPECIAL REQUESTS: ADEQUATE
SPECIAL REQUESTS: ADEQUATE

## 2017-04-05 LAB — GLUCOSE, CAPILLARY
GLUCOSE-CAPILLARY: 114 mg/dL — AB (ref 65–99)
GLUCOSE-CAPILLARY: 141 mg/dL — AB (ref 65–99)
GLUCOSE-CAPILLARY: 96 mg/dL (ref 65–99)
Glucose-Capillary: 138 mg/dL — ABNORMAL HIGH (ref 65–99)
Glucose-Capillary: 79 mg/dL (ref 65–99)

## 2017-04-05 LAB — COMPREHENSIVE METABOLIC PANEL
ALBUMIN: 1.8 g/dL — AB (ref 3.5–5.0)
ALK PHOS: 81 U/L (ref 38–126)
ALT: 32 U/L (ref 14–54)
ANION GAP: 5 (ref 5–15)
AST: 38 U/L (ref 15–41)
BILIRUBIN TOTAL: 0.7 mg/dL (ref 0.3–1.2)
BUN: 15 mg/dL (ref 6–20)
CALCIUM: 6.9 mg/dL — AB (ref 8.9–10.3)
CO2: 22 mmol/L (ref 22–32)
Chloride: 114 mmol/L — ABNORMAL HIGH (ref 101–111)
Creatinine, Ser: 0.82 mg/dL (ref 0.44–1.00)
GFR calc non Af Amer: >60 mL/min (ref >60)
GLUCOSE: 96 mg/dL (ref 65–99)
POTASSIUM: 3.5 mmol/L (ref 3.5–5.1)
SODIUM: 141 mmol/L (ref 135–145)
TOTAL PROTEIN: 4.5 g/dL — AB (ref 6.5–8.1)

## 2017-04-05 LAB — CBC
HCT: 34.1 % — ABNORMAL LOW (ref 35.0–47.0)
Hemoglobin: 11.3 g/dL — ABNORMAL LOW (ref 12.0–16.0)
MCH: 30.6 pg (ref 26.0–34.0)
MCHC: 33.2 g/dL (ref 32.0–36.0)
MCV: 92.1 fL (ref 80.0–100.0)
PLATELETS: 133 10*3/uL — AB (ref 150–440)
RBC: 3.7 MIL/uL — AB (ref 3.80–5.20)
RDW: 16.6 % — AB (ref 11.5–14.5)
WBC: 6 10*3/uL (ref 3.6–11.0)

## 2017-04-05 LAB — PHOSPHORUS: Phosphorus: 3 mg/dL (ref 2.5–4.6)

## 2017-04-05 LAB — MAGNESIUM: MAGNESIUM: 1.9 mg/dL (ref 1.7–2.4)

## 2017-04-05 MED ORDER — M.V.I. ADULT IV INJ
INJECTION | INTRAVENOUS | Status: AC
  Administered 2017-04-05: 17:00:00 via INTRAVENOUS
  Filled 2017-04-05: qty 1320

## 2017-04-05 MED ORDER — INSULIN ASPART 100 UNIT/ML ~~LOC~~ SOLN
0.0000 [IU] | Freq: Four times a day (QID) | SUBCUTANEOUS | Status: DC
  Administered 2017-04-06 – 2017-04-07 (×3): 1 [IU] via SUBCUTANEOUS
  Filled 2017-04-05 (×3): qty 1

## 2017-04-05 MED ORDER — POTASSIUM CHLORIDE 10 MEQ/100ML IV SOLN
10.0000 meq | INTRAVENOUS | Status: AC
  Administered 2017-04-05 (×2): 10 meq via INTRAVENOUS
  Filled 2017-04-05 (×2): qty 100

## 2017-04-05 NOTE — Progress Notes (Signed)
Patient has been awake all shift, until recently fell asleep. No indications of pain or discomfort. Continue on IV ABX. Rectal tube intact, stool with sediment now. Foley patent and draining clear yellow urine. Continue on dopamine gtt. No concerns at this time.

## 2017-04-05 NOTE — Progress Notes (Signed)
NAD Noncommunicative Remains on dopamine at 7 mcg/kilograms/min  Vitals:   04/05/17 0800 04/05/17 0815 04/05/17 0830 04/05/17 0845  BP: (!) 87/52 92/62 (!) 89/72 106/65  Pulse: 68 74 76 70  Resp: 14 12 16 12   Temp:      TempSrc:      SpO2: 98% 99% 99% 100%  Weight:      Height:        Gen: NAD HEENT: NCAT, sclerae white Neck: no JVD noted Lungs: full BS anteriorly, no adventitious sounds Cardiovascular: Reg, no M noted Abdomen: Soft, NT, +BS Ext: no C/C/E Neuro: grossly intact Skin: No lesions noted  BMET    Component Value Date/Time   NA 141 04/05/2017 0431   K 3.5 04/05/2017 0431   CL 114 (H) 04/05/2017 0431   CO2 22 04/05/2017 0431   GLUCOSE 96 04/05/2017 0431   BUN 15 04/05/2017 0431   CREATININE 0.82 04/05/2017 0431   CALCIUM 6.9 (L) 04/05/2017 0431   GFRNONAA >60 04/05/2017 0431   GFRAA >60 04/05/2017 0431    CBC Latest Ref Rng & Units 04/05/2017 04/04/2017 04/03/2017  WBC 3.6 - 11.0 K/uL 6.0 8.0 9.2  Hemoglobin 12.0 - 16.0 g/dL 11.3(L) 12.2 10.2(L)  Hematocrit 35.0 - 47.0 % 34.1(L) 37.1 31.4(L)  Platelets 150 - 440 K/uL 133(L) 139(L) 136(L)    No new CXR  IMPRESSION: C. difficile colitis Severe sepsis with shock Volume depletion, resolved Bradycardia, improved Acute on chronic kidney injury -resolved Dementia, acute encephalopathy Hypernatremia - resolved Hypokalemia resolved Minimal troponin elevation -does not warrant further evaluation at this time Pressure ulcer Routine-calorie malnutrition Suspected esophageal stricture DNR  PLAN/REC: Continue nutritional support with TPN Gastroenterology following and plans endoscopy when fully stabilized Continue intravenous metronidazole and vancomycin enemas Wean dopamine to off for HR >60/min, MA P >60 mmHg  Rebecca Fischeravid Sueanne Maniaci, MD PCCM service Mobile [phone] Pager [phone] 04/05/2017 1:29 PM

## 2017-04-05 NOTE — Progress Notes (Signed)
[facility] [place] at [facility]   PATIENT NAME: Rebecca Webb    MR#:  [id number]  DATE OF BIRTH:  June 23, 1928  SUBJECTIVE: Admitted for septic shock due to C. difficile colitis..  On TPN now. on dopamine for septic shock, bradycardia.  Not able to come off pressors.  Diarrhea decreased.  CHIEF COMPLAINT:   Chief Complaint  Patient presents with  . Altered Mental Status    REVIEW OF SYSTEMS:    Review of Systems  Unable to perform ROS: Critical illness    Nutrition: npo Tolerating Diet: Tolerating PT:      DRUG ALLERGIES:   Allergies  Allergen Reactions  . Lisinopril Swelling    Tongue and lips was swollen.  . Haldol [Haloperidol Lactate] Rash    VITALS:  Blood pressure 106/65, pulse 70, temperature 98.8 F (37.1 C), temperature source Axillary, resp. rate 12, height 5' (1.524 m), weight 47 kg (103 lb 9.9 oz), SpO2 100 %.  PHYSICAL EXAMINATION:   Physical Exam  GENERAL:  83 y.o.-year-old patient lying in the bed .  Appears cachectic. EYES: Pupils equal, round, reactive to light . No scleral icterus. Extraocular muscles intact.  HEENT: Head atraumatic, normocephalic. Oropharynx and nasopharynx clear.  NECK:  Supple, no jugular venous distention. No thyroid enlargement, no tenderness.  LUNGS: Normal breath sounds bilaterally, no wheezing, rales,rhonchi or crepitation. No use of accessory muscles of respiration.  CARDIOVASCULAR: S1, S2 normal. No murmurs, rubs, or gallops.  ABDOMEN: Soft, nontender, nondistended. Bowel sounds present. No organomegaly or mass.  EXTREMITIES: No pedal edema, cyanosis, or clubbing.  NEUROLOGIC unable to do  neurological exam because of her dementia.  PSYCHIATRIC: Patient is demented.   SKIN: No obvious rash, lesion, or ulcer.    LABORATORY PANEL:   CBC Recent Labs  Lab 04/05/17 0431  WBC 6.0  HGB 11.3*  HCT 34.1*  PLT 133*    ------------------------------------------------------------------------------------------------------------------  Chemistries  Recent Labs  Lab 04/05/17 0431  NA 141  K 3.5  CL 114*  CO2 22  GLUCOSE 96  BUN 15  CREATININE 0.82  CALCIUM 6.9*  MG 1.9  AST 38  ALT 32  ALKPHOS 81  BILITOT 0.7   ------------------------------------------------------------------------------------------------------------------  Cardiac Enzymes Recent Labs  Lab 04/02/17 1145  TROPONINI 0.24*   ------------------------------------------------------------------------------------------------------------------  RADIOLOGY:  Ct Head Wo Contrast  Result Date: 04/03/2017 CLINICAL DATA:  Septic shock.  Dementia.  Mental status changes. EXAM: CT HEAD WITHOUT CONTRAST TECHNIQUE: Contiguous axial images were obtained from the base of the skull through the vertex without intravenous contrast. COMPARISON:  08/01/2013 FINDINGS: Brain: Chronic atrophy, temporal lobe predominant. Mild chronic small-vessel ischemic changes of the white matter. No sign of acute infarction, mass lesion, hemorrhage, hydrocephalus or extra-axial collection. Vascular: There is atherosclerotic calcification of the major vessels at the base of the brain. Skull: Normal Sinuses/Orbits: Clear/normal Other: None IMPRESSION: No acute or reversible finding. Atrophy with temporal lobe predominance. Electronically Signed   By: Paulina FusiMark  Shogry M.D.   On: 04/03/2017 16:04     ASSESSMENT AND PLAN:   Principal Problem:   Sepsis (HCC) Active Problems:   A-fib (HCC)   Alzheimer's dementia   Benign essential hypertension   AKI (acute kidney injury) (HCC)   Elevated troponin   C. difficile diarrhea   Pressure injury of skin   #1 sepsis present on admission secondary to C. difficile colitis.  Continue  pressors, antibiotics with Flagyl, vancomycin enemas.  Continue dopamine to keep map more than 60.  Still requiring dopamine.   no further  diarrhea.  2.  Hyponatremia, hypokalemia, acute renal failure secondary to septic shock: Improving, hyponatremia, hypokalemia getting corrected.  Acute renal failure also improving, making good urine output. 3.. Septic shock ;stopped metoprolol, Cardizem.  Secondary to hypotension. For dysphagia, possible esophageal stricture/mass , patient unable to get EGD because of septic shock, l, advanced age unable to pass NG tube , barium study performed.. Prognosis really poor, discussed with granddaughters at bedside. #4 .hypokalemia: Improving. #5 hyponatremia;better/ Plan  to continue TPN, pressors, IV Flagyl, vancomycin  Enemas. see how she does. All the records are reviewed and case discussed with Care Management/Social Workerr. Management plans discussed with the patient, family and they are in agreement.  CODE STATUS: DNR  TOTAL TIME TAKING CARE OF THIS PATIENT: 35minutes.   Very sick unable to plan for discharge disposition.  Katha HammingSnehalatha Analisa Sledd M.D on 04/05/2017 at 11:30 AM  Between 7am to 6pm - Pager - [phone]  After 6pm go to [email or web address] - password [email or web address] [facility]  [facility]  Office  [phone]  CC: Primary care physician; Leotis ShamesSingh, Jasmine, MD

## 2017-04-05 NOTE — Progress Notes (Signed)
PHARMACY - ADULT TOTAL PARENTERAL NUTRITION CONSULT NOTE   Pharmacy Consult for TPN Indication: no intake x 7 days   Patient Measurements: Height: 5' (152.4 cm) Weight: 103 lb 9.9 oz (47 kg) IBW/kg (Calculated) : 45.5 TPN AdjBW (KG): 40.2 Body mass index is 20.24 kg/m.   Assessment:  Pharmacy consulted for TPN management for 83 yo female being treated for Cdiff. Patient unable to receive tube feeds due to esophageal mass and stricture. Patient is at risk for refeeding syndrome.   12/16: K 3.5, Mag 1.9, Phos 3.0  Ca 6.9  Albumin 1.8   Albumin corrected Ca 8.7   TPN Access: RIJ TPN start date: 12/14  Goal TPN rate is 55 ml/hr  Current Nutrition: NPO  Plan:  Clinimix E 5/20 TPN at 55 mL/hr.  Hold 20% lipid emulsion for first 7 days for ICU patients per ASPEN guidelines (Start date 12/21) This TPN provides 66 g of protein, 1320 g of dextrose which provides 1161 kCals per day, meeting 96% of patient needs  Electrolytes in TPN:  Add MVI, trace elements.  MD ordered KCL 10 meq IV x 2 (20 meq total).  Q4hr SSI and adjust as needed. SSI/24 hours= 2 units.  Monitor TPN labs. F/U electrolytes in am  Verlie Liotta A 04/05/2017,11:17 AM

## 2017-04-06 LAB — COMPREHENSIVE METABOLIC PANEL
ALK PHOS: 79 U/L (ref 38–126)
ALT: 28 U/L (ref 14–54)
ANION GAP: 4 — AB (ref 5–15)
AST: 33 U/L (ref 15–41)
Albumin: 1.7 g/dL — ABNORMAL LOW (ref 3.5–5.0)
BILIRUBIN TOTAL: 0.3 mg/dL (ref 0.3–1.2)
BUN: 19 mg/dL (ref 6–20)
CALCIUM: 6.9 mg/dL — AB (ref 8.9–10.3)
CO2: 24 mmol/L (ref 22–32)
Chloride: 111 mmol/L (ref 101–111)
Creatinine, Ser: 0.72 mg/dL (ref 0.44–1.00)
Glucose, Bld: 124 mg/dL — ABNORMAL HIGH (ref 65–99)
POTASSIUM: 3.2 mmol/L — AB (ref 3.5–5.1)
Sodium: 139 mmol/L (ref 135–145)
TOTAL PROTEIN: 4.2 g/dL — AB (ref 6.5–8.1)

## 2017-04-06 LAB — GLUCOSE, CAPILLARY
GLUCOSE-CAPILLARY: 110 mg/dL — AB (ref 65–99)
GLUCOSE-CAPILLARY: 110 mg/dL — AB (ref 65–99)
GLUCOSE-CAPILLARY: 139 mg/dL — AB (ref 65–99)
GLUCOSE-CAPILLARY: 139 mg/dL — AB (ref 65–99)
Glucose-Capillary: 116 mg/dL — ABNORMAL HIGH (ref 65–99)

## 2017-04-06 LAB — CBC
HCT: 33.9 % — ABNORMAL LOW (ref 35.0–47.0)
Hemoglobin: 11 g/dL — ABNORMAL LOW (ref 12.0–16.0)
MCH: 30.2 pg (ref 26.0–34.0)
MCHC: 32.4 g/dL (ref 32.0–36.0)
MCV: 93.1 fL (ref 80.0–100.0)
PLATELETS: 157 10*3/uL (ref 150–440)
RBC: 3.64 MIL/uL — AB (ref 3.80–5.20)
RDW: 16.6 % — AB (ref 11.5–14.5)
WBC: 7.1 10*3/uL (ref 3.6–11.0)

## 2017-04-06 LAB — MAGNESIUM: MAGNESIUM: 1.3 mg/dL — AB (ref 1.7–2.4)

## 2017-04-06 LAB — PHOSPHORUS: Phosphorus: 2.5 mg/dL (ref 2.5–4.6)

## 2017-04-06 MED ORDER — SODIUM CHLORIDE 0.9 % IV SOLN
0.0000 ug/min | INTRAVENOUS | Status: DC
  Filled 2017-04-06: qty 1

## 2017-04-06 MED ORDER — TRACE MINERALS CR-CU-MN-SE-ZN 10-1000-500-60 MCG/ML IV SOLN
INTRAVENOUS | Status: AC
  Administered 2017-04-06 (×2): via INTRAVENOUS
  Filled 2017-04-06 (×4): qty 1320

## 2017-04-06 MED ORDER — MAGNESIUM SULFATE 4 GM/100ML IV SOLN
4.0000 g | Freq: Once | INTRAVENOUS | Status: AC
  Administered 2017-04-06: 4 g via INTRAVENOUS
  Filled 2017-04-06: qty 100

## 2017-04-06 MED ORDER — SODIUM CHLORIDE 0.9 % IV SOLN
Freq: Once | INTRAVENOUS | Status: AC
  Administered 2017-04-06: 13:00:00 via INTRAVENOUS
  Filled 2017-04-06: qty 500

## 2017-04-06 NOTE — Plan of Care (Signed)
Patient resting most of shift. Continues on Enteric precautions for C-diff.  Antibiotics continue. Not interactive this shift. All meds tolerated. No acute distress noted. Will continue to monitor.

## 2017-04-06 NOTE — Progress Notes (Signed)
PHARMACY - ADULT TOTAL PARENTERAL NUTRITION CONSULT NOTE   Pharmacy Consult for TPN Indication: no intake x 7 days   Patient Measurements: Height: 5' (152.4 cm) Weight: 103 lb 9.9 oz (47 kg) IBW/kg (Calculated) : 45.5 TPN AdjBW (KG): 40.2 Body mass index is 20.24 kg/m.   Assessment:  Pharmacy consulted for TPN management for 83 yo female being treated for Cdiff. Patient unable to receive tube feeds due to esophageal mass and stricture. Patient is at risk for refeeding syndrome. Goal potassium >/= 4 and goal magnesium >/= 2.     TPN Access: RIJ TPN start date: 12/14  Goal TPN rate is 55 ml/hr  Current Nutrition: NPO  Plan:  Clinimix E 5/20 TPN at 55 mL/hr.  Hold 20% lipid emulsion for first 7 days for ICU patients per ASPEN guidelines (Start date 12/21) This TPN provides 66 g of protein, 1320 g of dextrose which provides 1161 kCals per day, meeting 96% of patient needs  Electrolytes in TPN:  Add MVI, trace elements.  Replace potassium 60mEq IV x 1 and magnesium 2g IV x 1. Will recheck potassium/magnesium at 1800 and all electrolytes with am labs.   Q4hr SSI and adjust as needed. SSI/24 hours= 1 units.  Monitor TPN labs. F/U electrolytes in am  Bridgit Eynon L 04/06/2017,11:21 AM

## 2017-04-06 NOTE — Progress Notes (Signed)
Per Dr. Belia HemanKasa, MAP goal of 55.

## 2017-04-06 NOTE — Progress Notes (Signed)
PROGRESS NOTE  CC shock  HPI NAD Noncommunicative Remains on dopamine at 7 mcg/kilograms/min  Vitals:   04/06/17 0400 04/06/17 0500 04/06/17 0600 04/06/17 0700  BP: (!) 113/59 122/62 (!) 71/54 (!) 75/49  Pulse: (!) 59 67 (!) 123 (!) 124  Resp: (!) 8 13 13 14   Temp: 98.8 F (37.1 C)     TempSrc: Axillary     SpO2: 100% 98% 100% 96%  Weight:      Height:       Gen: NAD HEENT: NCAT, sclerae white Neck: no JVD noted Lungs: full BS anteriorly, no adventitious sounds Cardiovascular: Reg, no M noted Abdomen: Soft, NT, +BS Ext: no C/C/E Neuro: grossly intact Skin: No lesions noted   BMET    Component Value Date/Time   NA 141 04/05/2017 0431   K 3.5 04/05/2017 0431   CL 114 (H) 04/05/2017 0431   CO2 22 04/05/2017 0431   GLUCOSE 96 04/05/2017 0431   BUN 15 04/05/2017 0431   CREATININE 0.82 04/05/2017 0431   CALCIUM 6.9 (L) 04/05/2017 0431   GFRNONAA >60 04/05/2017 0431   GFRAA >60 04/05/2017 0431    CBC Latest Ref Rng & Units 04/05/2017 04/04/2017 04/03/2017  WBC 3.6 - 11.0 K/uL 6.0 8.0 9.2  Hemoglobin 12.0 - 16.0 g/dL 11.3(L) 12.2 10.2(L)  Hematocrit 35.0 - 47.0 % 34.1(L) 37.1 31.4(L)  Platelets 150 - 440 K/uL 133(L) 139(L) 136(L)    No new CXR  IMPRESSION: C. difficile colitis Severe sepsis with shock Volume depletion, resolved Bradycardia, improved Acute on chronic kidney injury -resolved Dementia, acute encephalopathy Hypernatremia - resolved Hypokalemia resolved Minimal troponin elevation -does not warrant further evaluation at this time Pressure ulcer Routine-calorie malnutrition Suspected esophageal stricture DNR/DNI  PLAN/REC: Continue nutritional support with TPN Gastroenterology following and plans endoscopy when fully stabilized Continue intravenous metronidazole and vancomycin enemas Wean dopamine to off for HR >60/min, MA P >60 mmHg   Lucie LeatherKurian David Khamiya Varin, M.D.  [facility]  Medical Director [facility]  [facility] Medical Director [facility] Cardio-P[facility] Department

## 2017-04-07 DIAGNOSIS — I480 Paroxysmal atrial fibrillation: Secondary | ICD-10-CM

## 2017-04-07 LAB — CBC
HEMATOCRIT: 29.6 % — AB (ref 35.0–47.0)
HEMOGLOBIN: 9.8 g/dL — AB (ref 12.0–16.0)
MCH: 30.3 pg (ref 26.0–34.0)
MCHC: 33.2 g/dL (ref 32.0–36.0)
MCV: 91.4 fL (ref 80.0–100.0)
Platelets: 160 10*3/uL (ref 150–440)
RBC: 3.24 MIL/uL — ABNORMAL LOW (ref 3.80–5.20)
RDW: 17 % — ABNORMAL HIGH (ref 11.5–14.5)
WBC: 8.5 10*3/uL (ref 3.6–11.0)

## 2017-04-07 LAB — GLUCOSE, CAPILLARY
GLUCOSE-CAPILLARY: 119 mg/dL — AB (ref 65–99)
Glucose-Capillary: 127 mg/dL — ABNORMAL HIGH (ref 65–99)
Glucose-Capillary: 110 mg/dL — ABNORMAL HIGH (ref 65–99)

## 2017-04-07 LAB — BASIC METABOLIC PANEL
ANION GAP: 4 — AB (ref 5–15)
BUN: 21 mg/dL — AB (ref 6–20)
CALCIUM: 6.9 mg/dL — AB (ref 8.9–10.3)
CO2: 22 mmol/L (ref 22–32)
CREATININE: 0.69 mg/dL (ref 0.44–1.00)
Chloride: 111 mmol/L (ref 101–111)
GFR calc Af Amer: >60 mL/min (ref >60)
GLUCOSE: 109 mg/dL — AB (ref 65–99)
POTASSIUM: 3.7 mmol/L (ref 3.5–5.1)
Sodium: 137 mmol/L (ref 135–145)

## 2017-04-07 LAB — PHOSPHORUS: Phosphorus: 2 mg/dL — ABNORMAL LOW (ref 2.5–4.6)

## 2017-04-07 LAB — MAGNESIUM: Magnesium: 2.1 mg/dL (ref 1.7–2.4)

## 2017-04-07 MED ORDER — POTASSIUM PHOSPHATES 15 MMOLE/5ML IV SOLN
20.0000 mmol | Freq: Once | INTRAVENOUS | Status: AC
  Administered 2017-04-07: 20 mmol via INTRAVENOUS
  Filled 2017-04-07: qty 6.67

## 2017-04-07 MED ORDER — TRACE MINERALS CR-CU-MN-SE-ZN 10-1000-500-60 MCG/ML IV SOLN
INTRAVENOUS | Status: AC
  Administered 2017-04-07: 18:00:00 via INTRAVENOUS
  Filled 2017-04-07: qty 1320

## 2017-04-07 NOTE — Progress Notes (Signed)
PHARMACY - ADULT TOTAL PARENTERAL NUTRITION CONSULT NOTE   Pharmacy Consult for TPN Indication: no intake x 7 days   Patient Measurements: Height: 5' (152.4 cm) Weight: 103 lb 9.9 oz (47 kg) IBW/kg (Calculated) : 45.5 TPN AdjBW (KG): 40.2 Body mass index is 20.24 kg/m.   Assessment:  Pharmacy consulted for TPN management for 83 yo female being treated for Cdiff. Patient unable to receive tube feeds due to esophageal mass and stricture. Patient is at risk for refeeding syndrome. Goal potassium >/= 4 and goal magnesium >/= 2.     TPN Access: RIJ TPN start date: 12/14  Goal TPN rate is 55 ml/hr  Current Nutrition: NPO  Plan:  Clinimix E 5/20 TPN at 55 mL/hr.  Hold 20% lipid emulsion for first 7 days for ICU patients per ASPEN guidelines (Start date 12/21) This TPN provides 66 g of protein, 1320 g of dextrose which provides 1161 kCals per day, meeting 96% of patient needs  Electrolytes in TPN:  Add MVI, trace elements.  Replace potassium phosphate 20mmol IV x 1.   Q4hr SSI and adjust as needed. SSI/24 hours= 1 units.  Monitor TPN labs. F/U electrolytes in am  Simpson,Michael L 04/07/2017,2:30 PM

## 2017-04-07 NOTE — Progress Notes (Signed)
Per MD, dopamine should be stopped to see if patient's vitals will gradually stabilize.

## 2017-04-07 NOTE — Progress Notes (Signed)
[facility] [place] [facility]   PATIENT NAME: Rebecca FlightBirtie Webb    MR#:  [id number]  DATE OF BIRTH:  06/01/1928    CHIEF COMPLAINT:   Chief Complaint  Patient presents with  . Altered Mental Status   C diff and septic shock. On dopamine and TPN.  REVIEW OF SYSTEMS:    Review of Systems  Unable to perform ROS: Critical illness   DRUG ALLERGIES:   Allergies  Allergen Reactions  . Lisinopril Swelling    Tongue and lips was swollen.  . Haldol [Haloperidol Lactate] Rash    VITALS:  Blood pressure (!) 79/48, pulse (!) 55, temperature (!) 97.5 F (36.4 C), temperature source Oral, resp. rate 17, height 5' (1.524 m), weight 47 kg (103 lb 9.9 oz), SpO2 100 %.  PHYSICAL EXAMINATION:   Physical Exam  GENERAL:  83 y.o.-year-old patient lying in the bed .  Appears cachectic. EYES: Pupils equal, round, reactive to light . No scleral icterus. Extraocular muscles intact.  HEENT: Head atraumatic, normocephalic. Oropharynx and nasopharynx clear.  NECK:  Supple, no jugular venous distention. No thyroid enlargement, no tenderness.  LUNGS: Normal breath sounds bilaterally CARDIOVASCULAR: S1, S2 normal. No murmurs, rubs, or gallops.  ABDOMEN: Soft, nontender, nondistended. Bowel sounds present.  EXTREMITIES: No pedal edema, cyanosis, or clubbing.  NEUROLOGIC Non following instructions SKIN: No obvious rash, lesion, or ulcer.    LABORATORY PANEL:   CBC Recent Labs  Lab 04/07/17 0429  WBC 8.5  HGB 9.8*  HCT 29.6*  PLT 160   ------------------------------------------------------------------------------------------------------------------  Chemistries  Recent Labs  Lab 04/06/17 0932 04/07/17 0429  NA 139 137  K 3.2* 3.7  CL 111 111  CO2 24 22  GLUCOSE 124* 109*  BUN 19 21*  CREATININE 0.72 0.69  CALCIUM 6.9* 6.9*  MG 1.3* 2.1  AST 33  --   ALT 28  --   ALKPHOS 79  --   BILITOT 0.3  --     ------------------------------------------------------------------------------------------------------------------  Cardiac Enzymes Recent Labs  Lab 04/02/17 1145  TROPONINI 0.24*   ------------------------------------------------------------------------------------------------------------------  RADIOLOGY:  No results found.   ASSESSMENT AND PLAN:   Principal Problem:   Sepsis (HCC) Active Problems:   A-fib (HCC)   Alzheimer's dementia   Benign essential hypertension   AKI (acute kidney injury) (HCC)   Elevated troponin   C. difficile diarrhea   Pressure injury of skin   # C. difficile colitis with septic shock Continue  pressors, antibiotics with Flagyl, vancomycin enemas.   * AKI due to ATN/dehydartion Resolved  * HTN Medications held due to shock  * Dysphagia, possible esophageal stricture/mass , patient unable to get EGD because of septic shock unable to pass NG tube  * hypokalemia: Improving.  * Mild troponin elevation due to septic shock  * protein calorie malbutrition  All the records are reviewed and case discussed with Care Management/Social Worker Management plans discussed with the patient, family and they are in agreement.  CODE STATUS: DNR  TOTAL TIME TAKING CARE OF THIS PATIENT: 30 minutes.   Rebecca BailiffSrikar R Anne Webb M.D on 04/07/2017 at 2:29 PM  Between 7am to 6pm - Pager - [phone]  After 6pm go to [email or web address] - password [email or web address] [facility]  [facility]  Office  [phone]  CC: Primary care physician; Leotis ShamesSingh, Jasmine, MD

## 2017-04-07 NOTE — Care Management (Signed)
RNCM spoke with patient's daughter [name] again and [facility] I have updated [place]Erika and Loury of this decisions. Cicero Duckrika will visit with [name] to answer any questions. [facility]. Cicero Duckrika will notify this RNCM when started.

## 2017-04-07 NOTE — Care Management (Addendum)
RNCM [facility].  Daughter [name] [phone] is open to opportunity [facility] or [facility] however she wishes to speak with [place]rika and [place]. I have notified both Cicero Duckrika and Richardean ChimeraLoury that daughter [name] would like to talk with them both.

## 2017-04-07 NOTE — Progress Notes (Signed)
[facility] [place] [facility]   PATIENT NAME: Rebecca FlightBirtie Parmar    MR#:  [id number]  DATE OF BIRTH:  [birth year]    CHIEF COMPLAINT:   Chief Complaint  Patient presents with  . Altered Mental Status   C diff and septic shock.  Off dopamine but hypotensive.   REVIEW OF SYSTEMS:    Review of Systems  Unable to perform ROS: Critical illness   DRUG ALLERGIES:   Allergies  Allergen Reactions  . Lisinopril Swelling    Tongue and lips was swollen.  . Haldol [Haloperidol Lactate] Rash    VITALS:  Blood pressure 93/73, pulse 61, temperature (!) 97.5 F (36.4 C), temperature source Oral, resp. rate 16, height 5' (1.524 m), weight 47 kg (103 lb 9.9 oz), SpO2 95 %.  PHYSICAL EXAMINATION:   Physical Exam  GENERAL:  83 y.o.-year-old patient lying in the bed .  Appears cachectic. EYES: Pupils equal, round, reactive to light . No scleral icterus. Extraocular muscles intact.  HEENT: Head atraumatic, normocephalic. Oropharynx and nasopharynx clear.  NECK:  Supple, no jugular venous distention. No thyroid enlargement, no tenderness.  LUNGS: Normal breath sounds bilaterally CARDIOVASCULAR: S1, S2 normal. No murmurs, rubs, or gallops.  ABDOMEN: Soft, nontender, nondistended. Bowel sounds present.  EXTREMITIES: No pedal edema, cyanosis, or clubbing.  NEUROLOGIC Non following instructions SKIN: No obvious rash, lesion, or ulcer.   LABORATORY PANEL:   CBC Recent Labs  Lab 04/07/17 0429  WBC 8.5  HGB 9.8*  HCT 29.6*  PLT 160   ------------------------------------------------------------------------------------------------------------------  Chemistries  Recent Labs  Lab 04/06/17 0932 04/07/17 0429  NA 139 137  K 3.2* 3.7  CL 111 111  CO2 24 22  GLUCOSE 124* 109*  BUN 19 21*  CREATININE 0.72 0.69  CALCIUM 6.9* 6.9*  MG 1.3* 2.1  AST 33  --   ALT 28  --   ALKPHOS 79  --   BILITOT 0.3  --     ------------------------------------------------------------------------------------------------------------------  Cardiac Enzymes Recent Labs  Lab 04/02/17 1145  TROPONINI 0.24*   ------------------------------------------------------------------------------------------------------------------  RADIOLOGY:  No results found.   ASSESSMENT AND PLAN:   Principal Problem:   Sepsis (HCC) Active Problems:   A-fib (HCC)   Alzheimer's dementia   Benign essential hypertension   AKI (acute kidney injury) (HCC)   Elevated troponin   C. difficile diarrhea   Pressure injury of skin   # C. difficile colitis with septic shock Continue   antibiotics with Flagyl, vancomycin enemas.   Off pressors for trial.  But patient is hypotensive.  Will have to restart pressors if blood pressure continues to stay low.  * AKI due to ATN/dehydartion Resolved  * HTN Medications held due to shock  * Dysphagia, possible esophageal stricture/mass , patient unable to get EGD because of septic shock unable to pass NG tube  * hypokalemia: Improving.  * Mild troponin elevation due to septic shock  * protein calorie malbutrition  All the records are reviewed and case discussed with Care Management/Social Worker Management plans discussed with the patient, family and they are in agreement.  CODE STATUS: DNR  TOTAL TIME TAKING CARE OF THIS PATIENT: 30 minutes.   Molinda BailiffSrikar R Zaiya Annunziato M.D on 04/07/2017 at 5:14 PM  Between 7am to 6pm - Pager - [phone]  After 6pm go to [email or web address] - password [email or web address] [facility]  [facility]  Office  [phone]  CC: Primary care physician; Leotis ShamesSingh, Jasmine, MD

## 2017-04-07 NOTE — Progress Notes (Signed)
2030 - pt found with her head at the foot of the bed.  She was tangled in her IV lines and monitor tubing.  Staff was able to reposition her.  Her linens were changed - she pulled our her flexiseal.  Bilateral mitts in place.  Telesitter in place per new order.

## 2017-04-07 NOTE — Progress Notes (Signed)
PROGRESS NOTE  CC shock  HPI NAD Noncommunicative Remains on dopamine at 7 mcg/kilograms/min  Vitals:   04/07/17 0400 04/07/17 0500 04/07/17 0600 04/07/17 0700  BP: (!) 85/56 (!) 87/40 (!) 119/103 (!) 89/54  Pulse: 75 69 68 69  Resp: (!) 22 19 18 20   Temp:      TempSrc:      SpO2: 98% 100% 100% 100%  Weight:      Height:       Gen: NAD HEENT: NCAT, sclerae white Neck: no JVD noted Lungs: full BS anteriorly, no adventitious sounds Cardiovascular: Reg, no M noted Abdomen: Soft, NT, +BS Ext: no C/C/E Neuro: grossly intact Skin: No lesions noted   BMET    Component Value Date/Time   NA 137 04/07/2017 0429   K 3.7 04/07/2017 0429   CL 111 04/07/2017 0429   CO2 22 04/07/2017 0429   GLUCOSE 109 (H) 04/07/2017 0429   BUN 21 (H) 04/07/2017 0429   CREATININE 0.69 04/07/2017 0429   CALCIUM 6.9 (L) 04/07/2017 0429   GFRNONAA >60 04/07/2017 0429   GFRAA >60 04/07/2017 0429    CBC Latest Ref Rng & Units 04/07/2017 04/06/2017 04/05/2017  WBC 3.6 - 11.0 K/uL 8.5 7.1 6.0  Hemoglobin 12.0 - 16.0 g/dL 1.6(X9.8(L) 11.0(L) 11.3(L)  Hematocrit 35.0 - 47.0 % 29.6(L) 33.9(L) 34.1(L)  Platelets 150 - 440 K/uL 160 157 133(L)    No new CXR  IMPRESSION: C. difficile colitis Severe sepsis with shock Volume depletion, resolved Bradycardia, improved Acute on chronic kidney injury -resolved Dementia, acute encephalopathy Hypernatremia - resolved Hypokalemia resolved Minimal troponin elevation -does not warrant further evaluation at this time Pressure ulcer Routine-calorie malnutrition Suspected esophageal stricture DNR/DNI  PLAN/REC: Continue nutritional support with TPN Gastroenterology following and plans endoscopy when fully stabilized Continue intravenous metronidazole and vancomycin enemas Wean dopamine to off for HR >60/min, MA P >60 mmHg   Lucie LeatherKurian David Neftali Abair, M.D.  [facility]  Medical Director [facility] Medical Director [facility]  Cardio-Pulmonary Department

## 2017-04-08 DIAGNOSIS — E43 Unspecified severe protein-calorie malnutrition: Secondary | ICD-10-CM

## 2017-04-08 DIAGNOSIS — I959 Hypotension, unspecified: Secondary | ICD-10-CM

## 2017-04-08 DIAGNOSIS — R001 Bradycardia, unspecified: Secondary | ICD-10-CM

## 2017-04-08 DIAGNOSIS — R6520 Severe sepsis without septic shock: Secondary | ICD-10-CM

## 2017-04-08 LAB — BASIC METABOLIC PANEL
Anion gap: 3 — ABNORMAL LOW (ref 5–15)
BUN: 27 mg/dL — AB (ref 6–20)
CO2: 23 mmol/L (ref 22–32)
Calcium: 7.1 mg/dL — ABNORMAL LOW (ref 8.9–10.3)
Chloride: 109 mmol/L (ref 101–111)
Creatinine, Ser: 0.83 mg/dL (ref 0.44–1.00)
GFR calc Af Amer: >60 mL/min (ref >60)
GLUCOSE: 111 mg/dL — AB (ref 65–99)
POTASSIUM: 3.9 mmol/L (ref 3.5–5.1)
Sodium: 135 mmol/L (ref 135–145)

## 2017-04-08 LAB — GLUCOSE, CAPILLARY
GLUCOSE-CAPILLARY: 90 mg/dL (ref 65–99)
GLUCOSE-CAPILLARY: 91 mg/dL (ref 65–99)
Glucose-Capillary: 118 mg/dL — ABNORMAL HIGH (ref 65–99)
Glucose-Capillary: 118 mg/dL — ABNORMAL HIGH (ref 65–99)
Glucose-Capillary: 71 mg/dL (ref 65–99)

## 2017-04-08 LAB — CBC
HEMATOCRIT: 28.5 % — AB (ref 35.0–47.0)
Hemoglobin: 9.5 g/dL — ABNORMAL LOW (ref 12.0–16.0)
MCH: 30.9 pg (ref 26.0–34.0)
MCHC: 33.2 g/dL (ref 32.0–36.0)
MCV: 93 fL (ref 80.0–100.0)
Platelets: 171 10*3/uL (ref 150–440)
RBC: 3.07 MIL/uL — ABNORMAL LOW (ref 3.80–5.20)
RDW: 17 % — AB (ref 11.5–14.5)
WBC: 9.3 10*3/uL (ref 3.6–11.0)

## 2017-04-08 LAB — PHOSPHORUS: PHOSPHORUS: 2.8 mg/dL (ref 2.5–4.6)

## 2017-04-08 LAB — MAGNESIUM: Magnesium: 1.7 mg/dL (ref 1.7–2.4)

## 2017-04-08 MED ORDER — MAGNESIUM SULFATE 2 GM/50ML IV SOLN
2.0000 g | Freq: Once | INTRAVENOUS | Status: AC
  Administered 2017-04-08: 2 g via INTRAVENOUS
  Filled 2017-04-08: qty 50

## 2017-04-08 MED ORDER — FAT EMULSION 20 % IV EMUL
250.0000 mL | INTRAVENOUS | Status: AC
  Administered 2017-04-08: 250 mL via INTRAVENOUS
  Filled 2017-04-08: qty 250

## 2017-04-08 MED ORDER — TRACE MINERALS CR-CU-MN-SE-ZN 10-1000-500-60 MCG/ML IV SOLN
INTRAVENOUS | Status: AC
  Administered 2017-04-08: 19:00:00 via INTRAVENOUS
  Filled 2017-04-08: qty 1320

## 2017-04-08 MED ORDER — VANCOMYCIN HCL 500 MG IV SOLR
500.0000 mg | Freq: Four times a day (QID) | Status: DC
  Administered 2017-04-08 – 2017-04-10 (×7): 500 mg via RECTAL
  Filled 2017-04-08 (×9): qty 500

## 2017-04-08 NOTE — Progress Notes (Signed)
PHARMACY - ADULT TOTAL PARENTERAL NUTRITION CONSULT NOTE   Pharmacy Consult for TPN Indication: no intake x 7 days   Patient Measurements: Height: 5' (152.4 cm) Weight: 103 lb 9.9 oz (47 kg) IBW/kg (Calculated) : 45.5 TPN AdjBW (KG): 40.2 Body mass index is 20.24 kg/m.   Assessment:  Pharmacy consulted for TPN management for 83 yo female being treated for Cdiff. Patient unable to receive tube feeds due to esophageal mass and stricture. Patient is at risk for refeeding syndrome. Goal potassium ~ 4 and goal magnesium ~ 2.     TPN Access: RIJ TPN start date: 12/14  Goal TPN rate is 55 ml/hr  Current Nutrition: NPO  Plan:  Clinimix E 5/15 TPN at 55 mL/hr.  Lipids @ 2415mL/hr x 12 hours (off dopamine 12/18; no longer ICU status) This TPN provides 66 g of protein,  1297 kCals per day  Electrolytes in TPN:  Add MVI, trace elements.  Replace magnesium 2g IV x 1.   Q4hr SSI and adjust as needed. SSI/24 hours= 1 units.  Monitor TPN labs. F/U electrolytes in am  Simpson,Michael L 04/08/2017,12:54 PM

## 2017-04-08 NOTE — Care Management (Signed)
Notified by [facility] (yesterday afternoon) that patient was not medically stable enough for transfer to LTAC due to Dopamine gtt.  They will monitor. Family updated per [facility].

## 2017-04-08 NOTE — Progress Notes (Signed)
Nutrition Follow-up  DOCUMENTATION CODES:   Severe malnutrition in context of chronic illness, Underweight  INTERVENTION:  Recommend new TPN regimen of Clinimix E 5/15 at 55 mL/hr + 20% ILE at 15 mL/hr over 12 hours. Provides 1297 kcal, 66 grams of protein, 1320 mL fluid daily.  Continue adult MVI and trace elements in TPN.  NUTRITION DIAGNOSIS:   Severe Malnutrition related to chronic illness(CKD, diarrhea) as evidenced by severe fat depletion, severe muscle depletion.  Ongoing.  GOAL:   Patient will meet greater than or equal to 90% of their needs  Met with TPN.  MONITOR:   Diet advancement, Labs, Weight trends, Skin, I & O's  REASON FOR ASSESSMENT:   Malnutrition Screening Tool    ASSESSMENT:   83 year old female with PMHx of dementia, HTN, GERD, depression, A-fib, HLD, vitamin D deficiency, CKD, recent hip fracture s/p left intertrochanteric IM nail 02/11/2017 now admitted with sepsis, AKI, C. Difficile diarrhea.   -Still pending EGD. May happen soon now that patient is off dopamine gtt.  Patient resting in bed at time of RD assessment. Has rectal tube in place.  IV Access: right IJ CVC triple lumen placed 03/31/2017; terminates at or near cavoatrial junction per chest x-ray 12/13 after being pulled back  TPN: patient has been tolerating Clinimix E 5/20 at 55 mL/hr  Medications reviewed and include: Novolog 0-9 units Q6hrs, vancomycin, magnesium sulfate 2 grams IV once today, Flagyl. Dopamine gtt was stopped on 12/18 at 1100.  Labs reviewed: CBG 90-127, BUN 27, Anion gap 3. Potassium, Phosphorus, and Magnesium are WNL.  Discussed with RN. Patient also discussed on rounds. Patient is now stable off dopamine gtt and is transferring out to floor today. Discussed with pharmacy. Can initiate lipids today as patient is now transitioning to floor care.  Diet Order:  Diet NPO time specified .TPN (CLINIMIX-E) Adult .TPN (CLINIMIX-E) Adult  EDUCATION NEEDS:   Not  appropriate for education at this time  Skin:  Skin Assessment: Skin Integrity Issues: Skin Integrity Issues:: Unstageable, Other (Comment) Unstageable: left foot Other: scattered ecchymosis  Last BM:  04/05/2017 - medium type 7 in rectal tube  Height:   Ht Readings from Last 1 Encounters:  04/01/17 5' (1.524 m)    Weight:   Wt Readings from Last 1 Encounters:  04/05/17 103 lb 9.9 oz (47 kg)    Ideal Body Weight:  45.5 kg  BMI:  Body mass index is 20.24 kg/m.  Estimated Nutritional Needs:   Kcal:  1205-1405 (30-35 kcal/kg)  Protein:  60-70 grams (1.5-1.7 grams/kg)  Fluid:  1.2-1.4 L/day (1 mL/kcal)  Willey Blade, MS, RD, LDN Office: [phone] Pager: [phone] After Hours/Weekend Pager: [phone]

## 2017-04-08 NOTE — Progress Notes (Signed)
NAD Noncommunicative Off of DA since last night  Vitals:   04/08/17 1000 04/08/17 1100 04/08/17 1200 04/08/17 1247  BP: (!) 100/57 (!) 98/51 (!) 80/56 (!) 100/52  Pulse: (!) 57 (!) 59 63 (!) 59  Resp: 15 11 15 12   Temp:      TempSrc:      SpO2: 98% 98% 97% 97%  Weight:      Height:       Gen: NAD HEENT: NCAT, sclerae white Neck: no JVD noted Lungs: full BS anteriorly, no adventitious sounds Cardiovascular: Reg, no M noted Abdomen: Soft, NT, +BS Ext: no C/C/E Neuro: grossly intact Skin: No lesions noted   BMET    Component Value Date/Time   NA 135 04/08/2017 0418   K 3.9 04/08/2017 0418   CL 109 04/08/2017 0418   CO2 23 04/08/2017 0418   GLUCOSE 111 (H) 04/08/2017 0418   BUN 27 (H) 04/08/2017 0418   CREATININE 0.83 04/08/2017 0418   CALCIUM 7.1 (L) 04/08/2017 0418   GFRNONAA >60 04/08/2017 0418   GFRAA >60 04/08/2017 0418    CBC Latest Ref Rng & Units 04/08/2017 04/07/2017 04/06/2017  WBC 3.6 - 11.0 K/uL 9.3 8.5 7.1  Hemoglobin 12.0 - 16.0 g/dL 4.0(J9.5(L) 8.1(X9.8(L) 11.0(L)  Hematocrit 35.0 - 47.0 % 28.5(L) 29.6(L) 33.9(L)  Platelets 150 - 440 K/uL 171 160 157    No new CXR  IMPRESSION: C. difficile colitis Severe sepsis with shock, resolved Volume depletion, resolved Bradycardia, resolved Acute on chronic kidney injury -resolved Dementia, acute encephalopathy - baseline unknown Hypernatremia - resolved Hypokalemia resolved Minimal troponin elevation -does not warrant further evaluation at this time Pressure ulcer Protein-calorie malnutrition Suspected esophageal stricture DNR/DNI  PLAN/REC: Continue nutritional support with TPN Gastroenterology needs to be contacted again to re-consider EGD Continue intravenous metronidazole and vancomycin enemas Transfer to MedSurg After transfer, PCCM will sign off. Please call if we can be of further assistance  Billy Fischeravid Simonds, MD PCCM service Mobile [phone] Pager [phone] 04/08/2017 1:28 PM

## 2017-04-08 NOTE — Progress Notes (Addendum)
Pt has remained alert with disorientation x 4. Per daughter at bedside-pt is at mental basline. Pt with hx of alz-dementia. Pt remains off dopamine and vasopressors. Pt BP soft at rest-ok with a MAP >55 per Dr Sung AmabileSimonds, SB-50's on cardiac monitor. Pt remains on RA-SpO2 >95%, lung sounds clear to auscultation. Daughter-[name] has been updated at bedside. Pt with orders to transfer to floor with tele.

## 2017-04-09 DIAGNOSIS — R1310 Dysphagia, unspecified: Secondary | ICD-10-CM

## 2017-04-09 LAB — CBC WITH DIFFERENTIAL/PLATELET
BASOS ABS: 0 10*3/uL (ref 0–0.1)
BASOS PCT: 0 %
EOS ABS: 0.1 10*3/uL (ref 0–0.7)
Eosinophils Relative: 1 %
HEMATOCRIT: 33.2 % — AB (ref 35.0–47.0)
HEMOGLOBIN: 11 g/dL — AB (ref 12.0–16.0)
Lymphocytes Relative: 14 %
Lymphs Abs: 1.4 10*3/uL (ref 1.0–3.6)
MCH: 30.8 pg (ref 26.0–34.0)
MCHC: 33.3 g/dL (ref 32.0–36.0)
MCV: 92.6 fL (ref 80.0–100.0)
MONOS PCT: 6 %
Monocytes Absolute: 0.6 10*3/uL (ref 0.2–0.9)
NEUTROS ABS: 7.9 10*3/uL — AB (ref 1.4–6.5)
NEUTROS PCT: 79 %
Platelets: 204 10*3/uL (ref 150–440)
RBC: 3.58 MIL/uL — AB (ref 3.80–5.20)
RDW: 17.1 % — ABNORMAL HIGH (ref 11.5–14.5)
WBC: 9.9 10*3/uL (ref 3.6–11.0)

## 2017-04-09 LAB — GLUCOSE, CAPILLARY
GLUCOSE-CAPILLARY: 83 mg/dL (ref 65–99)
GLUCOSE-CAPILLARY: 89 mg/dL (ref 65–99)
Glucose-Capillary: 74 mg/dL (ref 65–99)

## 2017-04-09 LAB — COMPREHENSIVE METABOLIC PANEL
ALK PHOS: 95 U/L (ref 38–126)
ALT: 29 U/L (ref 14–54)
ANION GAP: 5 (ref 5–15)
AST: 48 U/L — ABNORMAL HIGH (ref 15–41)
Albumin: 2.1 g/dL — ABNORMAL LOW (ref 3.5–5.0)
BUN: 27 mg/dL — ABNORMAL HIGH (ref 6–20)
CALCIUM: 7.5 mg/dL — AB (ref 8.9–10.3)
CO2: 20 mmol/L — AB (ref 22–32)
Chloride: 109 mmol/L (ref 101–111)
Creatinine, Ser: 0.84 mg/dL (ref 0.44–1.00)
Glucose, Bld: 89 mg/dL (ref 65–99)
Potassium: 4.3 mmol/L (ref 3.5–5.1)
SODIUM: 134 mmol/L — AB (ref 135–145)
Total Bilirubin: 0.4 mg/dL (ref 0.3–1.2)
Total Protein: 4.9 g/dL — ABNORMAL LOW (ref 6.5–8.1)

## 2017-04-09 MED ORDER — TRACE MINERALS CR-CU-MN-SE-ZN 10-1000-500-60 MCG/ML IV SOLN
55.0000 mL/h | INTRAVENOUS | Status: AC
  Administered 2017-04-09: 18:00:00 via INTRAVENOUS
  Filled 2017-04-09: qty 1320

## 2017-04-09 MED ORDER — SODIUM CHLORIDE 0.9% FLUSH: 10.0000 mL | INTRAVENOUS | Status: DC | PRN

## 2017-04-09 NOTE — Progress Notes (Signed)
Rebecca Webb , MD [address], [address], [place], [place], [id number] [address], [address], [place], [place], [address] Phone: [phone]  Fax: [phone]   Jerelene ReddenBirtie B Webb is being followed for dysphagia  Subjective: Patient confused at baseline-no concerns raised. Discussed with her nurse   Objective: Vital signs in last 24 hours: Vitals:   04/08/17 1957 04/09/17 0409 04/09/17 0417 04/09/17 0832  BP: (!) 124/55 94/61  101/60  Pulse: 66 82 69 (!) 104  Resp: 16 14  16   Temp: (!) 97.5 F (36.4 C) 97.9 F (36.6 C)  97.6 F (36.4 C)  TempSrc: Oral Oral  Oral  SpO2:  (!) 79% 100% 91%  Weight:      Height:       Weight change:   Intake/Output Summary (Last 24 hours) at 04/09/2017 1251 Last data filed at 04/09/2017 1000 Gross per 24 hour  Intake 1706.16 ml  Output 650 ml  Net 1056.16 ml     Exam: Heart:: Regular rate and rhythm, S1S2 present or without murmur or extra heart sounds Lungs: normal, clear to auscultation and clear to auscultation and percussion Abdomen: soft, nontender, normal bowel sounds   Lab Results: @LABTEST2 @ Micro Results: Recent Results (from the past 240 hour(s))  Blood Culture (routine x 2)     Status: None   Collection Time: 03/31/17  7:23 PM  Result Value Ref Range Status   Specimen Description BLOOD RIGHT CENTRAL LINE  Final   Special Requests   Final    BOTTLES DRAWN AEROBIC AND ANAEROBIC Blood Culture adequate volume   Culture NO GROWTH 5 DAYS  Final   Report Status 04/05/2017 FINAL  Final  Blood Culture (routine x 2)     Status: None   Collection Time: 03/31/17  7:23 PM  Result Value Ref Range Status   Specimen Description BLOOD RIGHT CENTRAL LINE  Final   Special Requests   Final    BOTTLES DRAWN AEROBIC AND ANAEROBIC Blood Culture adequate volume   Culture NO GROWTH 5 DAYS  Final   Report Status 04/05/2017 FINAL  Final  MRSA PCR Screening     Status: None   Collection Time: 03/31/17 10:13 PM  Result Value Ref Range  Status   MRSA by PCR NEGATIVE NEGATIVE Final    Comment:        The GeneXpert MRSA Assay (FDA approved for NASAL specimens only), is one component of a comprehensive MRSA colonization surveillance program. It is not intended to diagnose MRSA infection nor to guide or monitor treatment for MRSA infections.   C difficile quick scan w PCR reflex     Status: Abnormal   Collection Time: 03/31/17 10:13 PM  Result Value Ref Range Status   C Diff antigen POSITIVE (A) NEGATIVE Final   C Diff toxin POSITIVE (A) NEGATIVE Final   C Diff interpretation Toxin producing C. difficile detected.  Final    Comment: CRITICAL RESULT CALLED TO, READ BACK BY AND VERIFIED WITH: BETH BUONO ON 03/31/17 AT 2330 JAG   Gastrointestinal Panel by PCR , Stool     Status: Abnormal   Collection Time: 03/31/17 10:13 PM  Result Value Ref Range Status   Campylobacter species NOT DETECTED NOT DETECTED Final   Plesimonas shigelloides NOT DETECTED NOT DETECTED Final   Salmonella species NOT DETECTED NOT DETECTED Final   Yersinia enterocolitica NOT DETECTED NOT DETECTED Final   Vibrio species NOT DETECTED NOT DETECTED Final   Vibrio cholerae NOT DETECTED NOT DETECTED Final  Enteroaggregative E coli (EAEC) NOT DETECTED NOT DETECTED Final   Enteropathogenic E coli (EPEC) DETECTED (A) NOT DETECTED Final    Comment: RESULT CALLED TO, READ BACK BY AND VERIFIED WITH: RENE BADD ON 04/01/17 AT 0006 JAG    Enterotoxigenic E coli (ETEC) NOT DETECTED NOT DETECTED Final   Shiga like toxin producing E coli (STEC) NOT DETECTED NOT DETECTED Final   Shigella/Enteroinvasive E coli (EIEC) NOT DETECTED NOT DETECTED Final   Cryptosporidium NOT DETECTED NOT DETECTED Final   Cyclospora cayetanensis NOT DETECTED NOT DETECTED Final   Entamoeba histolytica NOT DETECTED NOT DETECTED Final   Giardia lamblia NOT DETECTED NOT DETECTED Final   Adenovirus F40/41 NOT DETECTED NOT DETECTED Final   Astrovirus NOT DETECTED NOT DETECTED Final    Norovirus GI/GII NOT DETECTED NOT DETECTED Final   Rotavirus A NOT DETECTED NOT DETECTED Final   Sapovirus (I, II, IV, and V) NOT DETECTED NOT DETECTED Final   Studies/Results: No results found. Medications: I have reviewed the patient's current medications. Scheduled Meds: . heparin  5,000 Units Subcutaneous Q8H  . insulin aspart  0-9 Units Subcutaneous Q6H  . mouth rinse  15 mL Mouth Rinse BID  . vancomycin (VANCOCIN) rectal ENEMA  500 mg Rectal Q6H   Continuous Infusions: . Marland Kitchen.TPN (CLINIMIX-E) Adult 55 mL/hr at 04/09/17 1000  . metronidazole 500 mg (04/09/17 1217)   PRN Meds:.acetaminophen **OR** acetaminophen, [DISCONTINUED] ondansetron **OR** ondansetron (ZOFRAN) IV   Assessment: Principal Problem:   Sepsis (HCC) Active Problems:   A-fib (HCC)   Alzheimer's dementia   Benign essential hypertension   AKI (acute kidney injury) (HCC)   Elevated troponin   C. difficile diarrhea   Pressure injury of skin   Protein-calorie malnutrition, severe  Rebecca Webb is a 83 y.o. y/o female was admitted recently with  sepsis, shock secondary to C. difficile colitis.  I have been consulted for evaluation for an upper endoscopy for a possible stricture versus mass was seen on barium study which was performed after failure to pass an NG tube. She was on pressors and in the ICU till recently and now is more stable than prior. On treatment for C diff colitis   Plan: 1. EGD+/- dilation tomorrow . Patient is on TPN as unable to swallow.   Plan to obtain consent from daughter.      LOS: 9 days   Rebecca MoodKiran Sebastian Dzik, MD 04/09/2017, 12:51 PM

## 2017-04-09 NOTE — Care Management (Addendum)
Spoke with Alcario DroughtErica at [facility] regarding LTACH transfer tomorrow after EDG if appropriate.  She will send for insurance approval but it may take a while due to the holidays

## 2017-04-09 NOTE — Progress Notes (Signed)
[facility] [place] [facility]   PATIENT NAME: Rebecca FlightBirtie Webb    MR#:  [id number]  DATE OF BIRTH:  1928/12/08    CHIEF COMPLAINT:   Chief Complaint  Patient presents with  . Altered Mental Status   Confused.  Rectal tube in place  REVIEW OF SYSTEMS:    Review of Systems  Unable to perform ROS: Critical illness   DRUG ALLERGIES:   Allergies  Allergen Reactions  . Lisinopril Swelling    Tongue and lips was swollen.  . Haldol [Haloperidol Lactate] Rash    VITALS:  Blood pressure 101/60, pulse (!) 104, temperature 97.6 F (36.4 C), temperature source Oral, resp. rate 16, height 5' (1.524 m), weight 47 kg (103 lb 9.9 oz), SpO2 91 %.  PHYSICAL EXAMINATION:   Physical Exam  GENERAL:  83 y.o.-year-old patient lying in the bed .  Appears cachectic. EYES: Pupils equal, round, reactive to light . No scleral icterus. Extraocular muscles intact.  HEENT: Head atraumatic, normocephalic. Oropharynx and nasopharynx clear.  NECK:  Supple, no jugular venous distention. No thyroid enlargement, no tenderness.  LUNGS: Normal breath sounds bilaterally CARDIOVASCULAR: S1, S2 normal. No murmurs, rubs, or gallops.  ABDOMEN: Soft, nontender, nondistended. Bowel sounds present.  EXTREMITIES: No pedal edema, cyanosis, or clubbing.  NEUROLOGIC Non following instructions SKIN: No obvious rash, lesion, or ulcer.   LABORATORY PANEL:   CBC Recent Labs  Lab 04/09/17 1019  WBC 9.9  HGB 11.0*  HCT 33.2*  PLT 204   ------------------------------------------------------------------------------------------------------------------  Chemistries  Recent Labs  Lab 04/08/17 0418 04/09/17 1019  NA 135 134*  K 3.9 4.3  CL 109 109  CO2 23 20*  GLUCOSE 111* 89  BUN 27* 27*  CREATININE 0.83 0.84  CALCIUM 7.1* 7.5*  MG 1.7  --   AST  --  48*  ALT  --  29  ALKPHOS  --  95  BILITOT  --  0.4    ------------------------------------------------------------------------------------------------------------------  Cardiac Enzymes No results for input(s): TROPONINI in the last 168 hours. ------------------------------------------------------------------------------------------------------------------  RADIOLOGY:  No results found.   ASSESSMENT AND PLAN:   Principal Problem:   Sepsis (HCC) Active Problems:   A-fib (HCC)   Alzheimer's dementia   Benign essential hypertension   AKI (acute kidney injury) (HCC)   Elevated troponin   C. difficile diarrhea   Pressure injury of skin   Protein-calorie malnutrition, severe   # C. difficile colitis with septic shock Continue   antibiotics with Flagyl, vancomycin enemas.    * AKI due to ATN/dehydartion Resolved  * HTN Medications held due to shock  * Dysphagia, possible esophageal stricture/mass , patient unable to get EGD because of septic shock unable to pass NG tube Discussed with Dr. Tobi BastosAnna. EGD tomorrow  * Hypokalemia: Improving.  * Mild troponin elevation due to septic shock  * Protein calorie malnutrition  All the records are reviewed and case discussed with Care Management/Social Worker Management plans discussed with the patient, family and they are in agreement.  CODE STATUS: DNR  TOTAL TIME TAKING CARE OF THIS PATIENT: 30 minutes.   Molinda BailiffSrikar R Danene Montijo M.D on 04/09/2017 at 1:55 PM  Between 7am to 6pm - Pager - [phone]  After 6pm go to [email or web address] - password [email or web address] [facility]  [facility]  Office  [phone]  CC: Primary care physician; Leotis ShamesSingh, Jasmine, MD

## 2017-04-09 NOTE — Progress Notes (Signed)
Rectal tube reinserted and 15 min later patient pulled out again

## 2017-04-09 NOTE — Progress Notes (Signed)
[facility]   PATIENT NAME: Rebecca FlightBirtie Webb    MR#:  [id number]  DATE OF BIRTH:  1929-01-19    CHIEF COMPLAINT:   Chief Complaint  Patient presents with  . Altered Mental Status   C diff and septic shock.   Off pressors  REVIEW OF SYSTEMS:    Review of Systems  Unable to perform ROS: Critical illness   DRUG ALLERGIES:   Allergies  Allergen Reactions  . Lisinopril Swelling    Tongue and lips was swollen.  . Haldol [Haloperidol Lactate] Rash    VITALS:  Blood pressure 101/60, pulse (!) 104, temperature 97.6 F (36.4 C), temperature source Oral, resp. rate 16, height 5' (1.524 m), weight 47 kg (103 lb 9.9 oz), SpO2 91 %.  PHYSICAL EXAMINATION:   Physical Exam  GENERAL:  83 y.o.-year-old patient lying in the bed .  Appears cachectic. EYES: Pupils equal, round, reactive to light . No scleral icterus. Extraocular muscles intact.  HEENT: Head atraumatic, normocephalic. Oropharynx and nasopharynx clear.  NECK:  Supple, no jugular venous distention. No thyroid enlargement, no tenderness.  LUNGS: Normal breath sounds bilaterally CARDIOVASCULAR: S1, S2 normal. No murmurs, rubs, or gallops.  ABDOMEN: Soft, nontender, nondistended. Bowel sounds present.  EXTREMITIES: No pedal edema, cyanosis, or clubbing.  NEUROLOGIC Non following instructions SKIN: No obvious rash, lesion, or ulcer.   LABORATORY PANEL:   CBC Recent Labs  Lab 04/09/17 1019  WBC 9.9  HGB 11.0*  HCT 33.2*  PLT 204   ------------------------------------------------------------------------------------------------------------------  Chemistries  Recent Labs  Lab 04/08/17 0418 04/09/17 1019  NA 135 134*  K 3.9 4.3  CL 109 109  CO2 23 20*  GLUCOSE 111* 89  BUN 27* 27*  CREATININE 0.83 0.84  CALCIUM 7.1* 7.5*  MG 1.7  --   AST  --  48*  ALT  --  29  ALKPHOS  --  95  BILITOT  --  0.4    ------------------------------------------------------------------------------------------------------------------  Cardiac Enzymes No results for input(s): TROPONINI in the last 168 hours. ------------------------------------------------------------------------------------------------------------------  RADIOLOGY:  No results found.   ASSESSMENT AND PLAN:   Principal Problem:   Sepsis (HCC) Active Problems:   A-fib (HCC)   Alzheimer's dementia   Benign essential hypertension   AKI (acute kidney injury) (HCC)   Elevated troponin   C. difficile diarrhea   Pressure injury of skin   Protein-calorie malnutrition, severe   # C. difficile colitis with septic shock Continue   antibiotics with Flagyl, vancomycin enemas.   Off pressors. Transfer to medical floor  * AKI due to ATN/dehydartion Resolved  * HTN Medications held due to shock  * Dysphagia, possible esophageal stricture/mass , patient unable to get EGD because of septic shock unable to pass NG tube Will consult GI  * hypokalemia: Improving.  * Mild troponin elevation due to septic shock  * protein calorie malnutrition  All the records are reviewed and case discussed with Care Management/Social Worker Management plans discussed with the patient, family and they are in agreement.  CODE STATUS: DNR  TOTAL TIME TAKING CARE OF THIS PATIENT: 30 minutes.   Molinda BailiffSrikar R Wenceslaus Gist M.D on 04/09/2017 at 1:52 PM  Between 7am to 6pm - Pager - [phone]  After 6pm go to [email or web address] - password [email or web address] [facility]  [facility]  Office  [phone]  CC: Primary care physician; Leotis ShamesSingh, Jasmine, MD

## 2017-04-09 NOTE — Progress Notes (Signed)
Patient pulled out rectal tube

## 2017-04-10 ENCOUNTER — Inpatient Hospital Stay: Payer: Medicare Other | Admitting: Anesthesiology

## 2017-04-10 ENCOUNTER — Encounter
Admission: EM | Disposition: A | Discharge status: Home Health | Payer: Self-pay | Source: Home / Self Care | Attending: Internal Medicine

## 2017-04-10 ENCOUNTER — Encounter: Payer: Self-pay | Admitting: Anesthesiology

## 2017-04-10 DIAGNOSIS — N179 Acute kidney failure, unspecified: Secondary | ICD-10-CM

## 2017-04-10 DIAGNOSIS — R4182 Altered mental status, unspecified: Secondary | ICD-10-CM

## 2017-04-10 DIAGNOSIS — Z7189 Other specified counseling: Secondary | ICD-10-CM

## 2017-04-10 DIAGNOSIS — K222 Esophageal obstruction: Secondary | ICD-10-CM

## 2017-04-10 HISTORY — PX: ESOPHAGOGASTRODUODENOSCOPY (EGD) WITH PROPOFOL: SHX5813

## 2017-04-10 LAB — GLUCOSE, CAPILLARY
GLUCOSE-CAPILLARY: 85 mg/dL (ref 65–99)
GLUCOSE-CAPILLARY: 88 mg/dL (ref 65–99)
GLUCOSE-CAPILLARY: 97 mg/dL (ref 65–99)
Glucose-Capillary: 112 mg/dL — ABNORMAL HIGH (ref 65–99)
Glucose-Capillary: 88 mg/dL (ref 65–99)

## 2017-04-10 SURGERY — ESOPHAGOGASTRODUODENOSCOPY (EGD) WITH PROPOFOL
Anesthesia: General

## 2017-04-10 MED ORDER — PROPOFOL 10 MG/ML IV BOLUS
INTRAVENOUS | Status: DC | PRN
  Administered 2017-04-10: 20 mg via INTRAVENOUS
  Administered 2017-04-10: 10 mg via INTRAVENOUS
  Administered 2017-04-10 (×2): 20 mg via INTRAVENOUS

## 2017-04-10 MED ORDER — ONDANSETRON HCL 4 MG/2ML IJ SOLN: 4.0000 mg | Freq: Once | INTRAMUSCULAR | Status: DC | PRN

## 2017-04-10 MED ORDER — TRACE MINERALS CR-CU-MN-SE-ZN 10-1000-500-60 MCG/ML IV SOLN
INTRAVENOUS | Status: DC
  Filled 2017-04-10: qty 1320

## 2017-04-10 MED ORDER — PROPOFOL 500 MG/50ML IV EMUL
INTRAVENOUS | Status: DC | PRN
  Administered 2017-04-10: 25 ug/kg/min via INTRAVENOUS

## 2017-04-10 MED ORDER — FENTANYL CITRATE (PF) 100 MCG/2ML IJ SOLN: 25.0000 ug | INTRAMUSCULAR | Status: DC | PRN

## 2017-04-10 MED ORDER — SODIUM CHLORIDE 0.9 % IV SOLN
INTRAVENOUS | Status: DC
  Administered 2017-04-10: 1000 mL via INTRAVENOUS

## 2017-04-10 MED ORDER — PHENYLEPHRINE HCL 10 MG/ML IJ SOLN
INTRAMUSCULAR | Status: DC | PRN
  Administered 2017-04-10 (×4): 100 ug via INTRAVENOUS

## 2017-04-10 MED ORDER — PROPOFOL 500 MG/50ML IV EMUL
INTRAVENOUS | Status: AC
  Filled 2017-04-10: qty 50

## 2017-04-10 MED ORDER — FAT EMULSION 20 % IV EMUL
250.0000 mL | INTRAVENOUS | Status: AC
  Administered 2017-04-10: 250 mL via INTRAVENOUS
  Filled 2017-04-10: qty 250

## 2017-04-10 MED ORDER — VANCOMYCIN 50 MG/ML ORAL SOLUTION
125.0000 mg | Freq: Four times a day (QID) | ORAL | Status: DC
  Administered 2017-04-10 – 2017-04-12 (×7): 125 mg via ORAL
  Filled 2017-04-10 (×9): qty 2.5

## 2017-04-10 MED ORDER — TRACE MINERALS CR-CU-MN-SE-ZN 10-1000-500-60 MCG/ML IV SOLN
INTRAVENOUS | Status: AC
  Administered 2017-04-10: 21:00:00 via INTRAVENOUS
  Filled 2017-04-10: qty 1320

## 2017-04-10 NOTE — Clinical Social Work Note (Signed)
Clinical Social Work Assessment  Patient Details  Name: Rebecca ReddenBirtie B Haverstock MRN: [id number] Date of Birth: 1928-12-09  Date of referral:  04/10/17               Reason for consult:  Facility Placement                Permission sought to share information with:    Permission granted to share information::     Name::        Agency::     Relationship::     Contact Information:     Housing/Transportation Living arrangements for the past 2 months:  Single Family Home Source of Information:  Guardian Patient Interpreter Needed:  None Criminal Activity/Legal Involvement Pertinent to Current Situation/Hospitalization:  No - Comment as needed Significant Relationships:  Other Family Members Lives with:  Other (Comment)(Grand-daughter [name]. ) Do you feel safe going back to the place where you live?    Need for family participation in patient care:  Yes (Comment)  Care giving concerns:  [place] in [place] with her grand-daughter/ legal guardian [name].    Social Worker assessment / plan:  Visual merchandiserClinical Social Worker (CSW) received verbal consult from RN case manager that AT[employer]Tpatient's insurance has denied LTAC and that patient may require SNF. CSW contacted patient's grand-daughter [name] to discuss D/C plan. CSW is familiar with patient and [name] from previous admission. [name] is patient's legal guardian and caregiver. Per [name] [facility] and doesn't want SNF placement. RN case manager aware of above. CSW will continue to follow and assist as needed.    Employment status:  Disabled (Comment on whether or not currently receiving Disability), Retired Health and safety inspectornsurance information:  [place] [employer] PT Recommendations:  Not assessed at this time Information / Referral to community resources:  Other (Comment Required)(Patient's grand-daughter refused SNF. )  Patient/Family's Response to care:  Patient's grand-daughter [name] prefers to take patient home.    Patient/Family's Understanding of and Emotional Response to Diagnosis, Current Treatment, and Prognosis:  Patient's grand-daughter [name] was very pleasant and thanked CSW for assistance.   Emotional Assessment Appearance:  Appears stated age Attitude/Demeanor/Rapport:  Unable to Assess Affect (typically observed):  Unable to Assess Orientation:  Oriented to Self, Fluctuating Orientation (Suspected and/or reported Sundowners) Alcohol / Substance use:  Not Applicable Psych involvement (Current and /or in the community):  No (Comment)  Discharge Needs  Concerns to be addressed:  Discharge Planning Concerns Readmission within the last 30 days:  No Current discharge risk:  Dependent with Mobility, Cognitively Impaired, Chronically ill Barriers to Discharge:  Continued Medical Work up   [employer], Darleen CrockerBailey M, LCSW 04/10/2017, 4:06 PM

## 2017-04-10 NOTE — Transfer of Care (Signed)
Immediate Anesthesia Transfer of Care Note  Patient: Rebecca Webb  Procedure(s) Performed: ESOPHAGOGASTRODUODENOSCOPY (EGD) WITH PROPOFOL (N/A )  Patient Location: PACU  Anesthesia Type:General  Level of Consciousness: sedated  Airway & Oxygen Therapy: Patient Spontanous Breathing and Patient connected to nasal cannula oxygen  Post-op Assessment: Report given to RN and Post -op Vital signs reviewed and stable  Post vital signs: Reviewed and stable  Last Vitals:  Vitals:   04/10/17 0809 04/10/17 1040  BP: 111/65 112/68  Pulse: (!) 57 (!) 56  Resp: 18 18  Temp: (!) 36.3 C (!) 36.1 C  SpO2: 100% 100%    Last Pain:  Vitals:   04/10/17 1040  TempSrc: Tympanic  PainSc:          Complications: No apparent anesthesia complications

## 2017-04-10 NOTE — Consult Note (Signed)
Consultation Note Date: 04/10/2017   Patient Name: Rebecca Webb  DOB: 09-May-1928  MRN: [id number]  Age / Sex: 83 y.o., female  PCP: Leotis ShamesSingh, Jasmine, MD Referring Physician: Milagros LollSudini, Srikar, MD  Reason for Consultation: Establishing goals of care  HPI/Patient Profile: Rebecca Webb  is a 83 y.o. female who presents with decreased mental status. Hx of CKD and A-fib. She has had difficulty swallowing and esophageal dilation today.      Clinical Assessment and Goals of Care: Rebecca Webb is retired from [employer]. She lives with her granddaughter. Spoke with granddaughter via telephone.  She has had a lot of falls with a recent hip fracture. Her family bought her a wheelchair. Has home therapy.  She is supposed to wear glasses but does not. Her swallowing has been deteriorating, and has not ate solid food in 2 months. Oral intake has declined.   We discussed diagnosis, prognosis, GOC, EOL wishes disposition and options.  A detailed discussion was had today regarding advanced directives.  Concepts specific to code status, artifical feeding and hydration, and IV antibiotics was had.  Values and goals of care important to patient and family were attempted to be elicited.   She confirms her DNR status. She is okay with current medical treatment, and is okay with diagnostics, IVF .   She would like a PEG tube if her swallowing does not improve.       SUMMARY OF RECOMMENDATIONS   Work with SLP tomorrow to determine swallow fx.    Code Status/Advance Care Planning:  DNR   Palliative Prophylaxis:   Aspiration   Prognosis:   Unable to determine  Discharge Planning: To Be Determined      Primary Diagnoses: Present on Admission: . A-fib (HCC) . Alzheimer's dementia . Benign essential hypertension . AKI (acute kidney injury) (HCC) . Elevated troponin . Sepsis (HCC) . C. difficile  diarrhea   I have reviewed the medical record, interviewed the patient and family, and examined the patient. The following aspects are pertinent.  Past Medical History:  Diagnosis Date  . Atrial fibrillation (HCC)   . Chronic kidney disease   . Dementia   . Dementia   . Depression   . GERD (gastroesophageal reflux disease)   . Hyperlipemia   . Hypertension   . Vitamin D deficiency    Social History   Socioeconomic History  . Marital status: Widowed    Spouse name: None  . Number of children: None  . Years of education: None  . Highest education level: None  Social Needs  . Financial resource strain: None  . Food insecurity - worry: None  . Food insecurity - inability: None  . Transportation needs - medical: None  . Transportation needs - non-medical: None  Occupational History  . None  Tobacco Use  . Smoking status: Never Smoker  . Smokeless tobacco: Current User    Types: Snuff  Substance and Sexual Activity  . Alcohol use: No  . Drug use: No  . Sexual activity:  Not Currently  Other Topics Concern  . None  Social History Narrative  . None   History reviewed. No pertinent family history. Scheduled Meds: . heparin  5,000 Units Subcutaneous Q8H  . insulin aspart  0-9 Units Subcutaneous Q6H  . mouth rinse  15 mL Mouth Rinse BID  . vancomycin (VANCOCIN) rectal ENEMA  500 mg Rectal Q6H   Continuous Infusions: . Marland KitchenTPN (CLINIMIX-E) Adult 55 mL/hr (04/09/17 1820)  . Marland KitchenTPN (CLINIMIX-E) Adult    . fat emulsion    . metronidazole Stopped (04/10/17 0356)   PRN Meds:.acetaminophen **OR** acetaminophen, [DISCONTINUED] ondansetron **OR** ondansetron (ZOFRAN) IV, sodium chloride flush Medications Prior to Admission:  Prior to Admission medications   Medication Sig Start Date End Date Taking? Authorizing Provider  aspirin EC 81 MG tablet Take 81 mg by mouth daily.    Yes [provider]  diltiazem (CARDIZEM) 30 MG tablet Take 1 tablet (30 mg total) by mouth every  6 (six) hours. 02/14/17  Yes Enedina Finner, MD  ferrous sulfate 325 (65 FE) MG tablet Take 1 tablet (325 mg total) by mouth daily with breakfast. 02/15/17  Yes Enedina Finner, MD  Vitamin D, Ergocalciferol, (DRISDOL) 50000 UNITS CAPS capsule Take 50,000 Units by mouth every 7 (seven) days.    Yes [provider]  amiodarone (PACERONE) 200 MG tablet Take 1 tablet (200 mg total) by mouth daily. Patient not taking: Reported on 03/31/2017 02/15/17   Enedina Finner, MD  diltiazem (CARDIZEM LA) 240 MG 24 hr tablet Take 1 tablet (240 mg total) by mouth daily. Patient not taking: Reported on 03/31/2017 12/24/16   Emily Filbert, MD  enoxaparin (LOVENOX) 40 MG/0.4ML injection Inject 0.4 mLs (40 mg total) into the skin daily. Patient not taking: Reported on 03/31/2017 02/15/17   Enedina Finner, MD  feeding supplement, ENSURE ENLIVE, (ENSURE ENLIVE) LIQD Take 237 mLs by mouth 3 (three) times daily between meals. 02/14/17   Enedina Finner, MD  HYDROcodone-acetaminophen (NORCO/VICODIN) 5-325 MG tablet Take 1-2 tablets by mouth every 6 (six) hours as needed for moderate pain. Patient not taking: Reported on 03/31/2017 02/14/17   Enedina Finner, MD  Memantine HCl ER 21 MG CP24 Take 21 mg by mouth daily. Patient not taking: Reported on 03/31/2017 08/27/16   Brandy Hale, MD  metoprolol tartrate (LOPRESSOR) 25 MG tablet Take 0.5 tablets (12.5 mg total) by mouth 2 (two) times daily. 02/14/17   Enedina Finner, MD  Sennosides-Docusate Sodium (SENEXON-S PO) Take 2 tablets by mouth daily.    [provider]   Allergies  Allergen Reactions  . Lisinopril Swelling    Tongue and lips was swollen.  . Haldol [Haloperidol Lactate] Rash   Review of Systems  Unable to perform ROS   Physical Exam  Constitutional: No distress.  Pulmonary/Chest: Effort normal.  Skin: Skin is warm and dry.    Vital Signs: BP (!) 101/51 (BP Location: Left Arm)   Pulse 60   Temp 97.8 F (36.6 C) (Oral)   Resp 14   Ht 5' (1.524 m)    Wt 47 kg (103 lb 9.9 oz)   SpO2 100%   BMI 20.24 kg/m  Pain Assessment: Faces   Pain Score: 0-No pain   SpO2: SpO2: 100 % O2 Device:SpO2: 100 % O2 Flow Rate: .O2 Flow Rate (L/min): 2 L/min  IO: Intake/output summary:   Intake/Output Summary (Last 24 hours) at 04/10/2017 1630 Last data filed at 04/10/2017 1538 Gross per 24 hour  Intake 2311.5 ml  Output 925 ml  Net 1386.5 ml    LBM: Last BM Date: 04/10/17 Baseline Weight: Weight: 40.8 kg (90 lb) Most recent weight: Weight: 47 kg (103 lb 9.9 oz)     Palliative Assessment/Data:40%     Time In: 4:20 Time Out: 4:50 Time Total: 30 min Greater than 50%  of this time was spent counseling and coordinating care related to the above assessment and plan.  Signed by: Morton Stallrystal Camyla Camposano, NP   Please contact Palliative Medicine Team phone at [phone] for questions and concerns.  For individual provider: See Loretha StaplerAmion

## 2017-04-10 NOTE — Progress Notes (Signed)
Brief Nutrition Follow-Up Note  Refer to previous assessment on 04/10/17 for further details. Case discussed with SLP, who reports that pt has been advanced to a dysphagia 1 with nectar thick liquids. Will order Hormel Shake (Vital Cuisine) TID with meals.   RD will continue to follow and adjust nutrition plan as needed.   Kanon Colunga A. Mayford KnifeWilliams, RD, LDN, CDE Pager: [phone] After hours Pager: [phone]

## 2017-04-10 NOTE — Anesthesia Post-op Follow-up Note (Signed)
Anesthesia QCDR form completed.        

## 2017-04-10 NOTE — Progress Notes (Signed)
Per Endoscopy nurse, pt had a formed BM and Rectal tube came out. MD Sudini made aware. Per MD, ok to keep rectal tube out.

## 2017-04-10 NOTE — Progress Notes (Signed)
Nutrition Follow-up  DOCUMENTATION CODES:   Severe malnutrition in context of chronic illness, Underweight  INTERVENTION:   -Recommend continue TPN regimen of Clinimix E 5/15 at 55 mL/hr + 20% ILE at 15 mL/hr over 12 hours. Provides 1297 kcal, 66 grams of protein, 1320 mL fluid daily.  -Continue adult MVI and trace elements in TPN.  NUTRITION DIAGNOSIS:   Severe Malnutrition related to chronic illness(CKD, diarrhea) as evidenced by severe fat depletion, severe muscle depletion.  Ongoing  GOAL:   Patient will meet greater than or equal to 90% of their needs  Met with TPN  MONITOR:   Diet advancement, Labs, Weight trends, Skin, I & O's  REASON FOR ASSESSMENT:   Consult New TPN/TNA  ASSESSMENT:   83 year old female with PMHx of dementia, HTN, GERD, depression, A-fib, HLD, vitamin D deficiency, CKD, recent hip fracture s/p left intertrochanteric IM nail 02/11/2017 now admitted with sepsis, AKI, C. Difficile diarrhea.  12/20- rectal tube pulled out by pt and replaced (400 ml output within the past 24 hours)   Pt underwent EGD today. Results were normal duodenum, large hiatal hernia, bennign appearing esophageal stenosis with was dilated. GI recommends dysphagia diet once alert and able to eat.   Pt remains on TPN. Receiving Clinimix E 5/15 TPN at 55 mL/hr and lipids @ 76m/hr x 12 hours. TPN regimen provides 1297 kcals and 66 grams of protein daily, which meets 100% of estimated kcal and protein needs.   No wt wt since 04/05/17.   [facility] RNCM note[facility].   Labs reviewed: CBGS: 74-89 (inpatient orders for glycemic control 0-9 units insulin aspart every 6 hours).   Diet Order:  Diet NPO time specified .TPN (CLINIMIX-E) Adult .TPN (CLINIMIX-E) Adult  EDUCATION NEEDS:   Not appropriate for education at this time  Skin:  Skin Assessment: Skin Integrity Issues: Skin Integrity Issues:: Other (Comment), Unstageable Unstageable: lt  foot Other: scattered ecchymosis  Last BM:  04/10/17 (300 ml via rectal tube)  Height:   Ht Readings from Last 1 Encounters:  04/01/17 5' (1.524 m)    Weight:   Wt Readings from Last 1 Encounters:  04/05/17 103 lb 9.9 oz (47 kg)    Ideal Body Weight:  45.5 kg  BMI:  Body mass index is 20.24 kg/m.  Estimated Nutritional Needs:   Kcal:  1205-1405 (30-35 kcal/kg)  Protein:  60-70 grams (1.5-1.7 grams/kg)  Fluid:  1.2-1.4 L/day (1 mL/kcal)    Ligia Duguay A. WJimmye Norman RD, LDN, CDE Pager: [phone] hours Pager: [phone]

## 2017-04-10 NOTE — H&P (Signed)
Wyline MoodKiran Chonda Baney, MD [address], [address], [place], [place], [address] [address], [place], [place], [address] Phone: [phone]  Fax: [phone]  Primary Care Physician:  Leotis ShamesSingh, Jasmine, MD   Pre-Procedure History & Physical: HPI:  Rebecca Webb is a 83 y.o. female is here for an endoscopy    Past Medical History:  Diagnosis Date  . Atrial fibrillation (HCC)   . Chronic kidney disease   . Dementia   . Dementia   . Depression   . GERD (gastroesophageal reflux disease)   . Hyperlipemia   . Hypertension   . Vitamin D deficiency     Past Surgical History:  Procedure Laterality Date  . CATARACT EXTRACTION    . INTRAMEDULLARY (IM) NAIL INTERTROCHANTERIC Left 02/11/2017   Procedure: INTRAMEDULLARY (IM) NAIL INTERTROCHANTRIC;  Surgeon: Deeann SaintMiller, Howard, MD;  Location: [facility] ORS;  Service: Orthopedics;  Laterality: Left;  . KIDNEY DONATION      Prior to Admission medications   Medication Sig Start Date End Date Taking? Authorizing Provider  aspirin EC 81 MG tablet Take 81 mg by mouth daily.    Yes [provider]  diltiazem (CARDIZEM) 30 MG tablet Take 1 tablet (30 mg total) by mouth every 6 (six) hours. 02/14/17  Yes Enedina FinnerPatel, Sona, MD  ferrous sulfate 325 (65 FE) MG tablet Take 1 tablet (325 mg total) by mouth daily with breakfast. 02/15/17  Yes Enedina FinnerPatel, Sona, MD  Vitamin D, Ergocalciferol, (DRISDOL) 50000 UNITS CAPS capsule Take 50,000 Units by mouth every 7 (seven) days.    Yes [provider]  amiodarone (PACERONE) 200 MG tablet Take 1 tablet (200 mg total) by mouth daily. Patient not taking: Reported on 03/31/2017 02/15/17   Enedina FinnerPatel, Sona, MD  diltiazem (CARDIZEM LA) 240 MG 24 hr tablet Take 1 tablet (240 mg total) by mouth daily. Patient not taking: Reported on 03/31/2017 12/24/16   Emily FilbertWilliams, Jonathan E, MD  enoxaparin (LOVENOX) 40 MG/0.4ML injection Inject 0.4 mLs (40 mg total) into the skin daily. Patient not taking: Reported on  03/31/2017 02/15/17   Enedina FinnerPatel, Sona, MD  feeding supplement, ENSURE ENLIVE, (ENSURE ENLIVE) LIQD Take 237 mLs by mouth 3 (three) times daily between meals. 02/14/17   Enedina FinnerPatel, Sona, MD  HYDROcodone-acetaminophen (NORCO/VICODIN) 5-325 MG tablet Take 1-2 tablets by mouth every 6 (six) hours as needed for moderate pain. Patient not taking: Reported on 03/31/2017 02/14/17   Enedina FinnerPatel, Sona, MD  Memantine HCl ER 21 MG CP24 Take 21 mg by mouth daily. Patient not taking: Reported on 03/31/2017 08/27/16   Brandy HaleFaheem, Uzma, MD  metoprolol tartrate (LOPRESSOR) 25 MG tablet Take 0.5 tablets (12.5 mg total) by mouth 2 (two) times daily. 02/14/17   Enedina FinnerPatel, Sona, MD  Sennosides-Docusate Sodium (SENEXON-S PO) Take 2 tablets by mouth daily.    [provider]    Allergies as of 03/31/2017 - Review Complete 03/31/2017  Allergen Reaction Noted  . Lisinopril Swelling 02/18/2015  . Haldol [haloperidol lactate] Rash 01/08/2016    History reviewed. No pertinent family history.  Social History   Socioeconomic History  . Marital status: Widowed    Spouse name: Not on file  . Number of children: Not on file  . Years of education: Not on file  . Highest education level: Not on file  Social Needs  . Financial resource strain: Not on file  . Food insecurity - worry: Not on file  . Food insecurity - inability: Not on file  .  Transportation needs - medical: Not on file  . Transportation needs - non-medical: Not on file  Occupational History  . Not on file  Tobacco Use  . Smoking status: Never Smoker  . Smokeless tobacco: Current User    Types: Snuff  Substance and Sexual Activity  . Alcohol use: No  . Drug use: No  . Sexual activity: Not Currently  Other Topics Concern  . Not on file  Social History Narrative  . Not on file    Review of Systems: See HPI, otherwise negative ROS  Physical Exam: BP 112/68   Pulse (!) 56   Temp (!) 97 F (36.1 C) (Tympanic)   Resp 18   Ht 5' (1.524 m)   Wt 103 lb  9.9 oz (47 kg)   SpO2 100%   BMI 20.24 kg/m  General:   Alert,  pleasant and cooperative in NAD Head:  Normocephalic and atraumatic. Neck:  Supple; no masses or thyromegaly. Lungs:  Clear throughout to auscultation, normal respiratory effort.    Heart:  +S1, +S2, Regular rate and rhythm, No edema. Abdomen:  Soft, nontender and nondistended. Normal bowel sounds, without guarding, and without rebound.   Neurologic:  Alert and  oriented x4;  grossly normal neurologically.  Impression/Plan: Rebecca Webb is here for an endoscopy  to be performed for  evaluation of dysphagia    Risks, benefits, limitations, and alternatives regarding endoscopy have been reviewed with the patients daughter .  Questions have been answered.  All parties agreeable.   Wyline MoodKiran Breon Diss, MD  04/10/2017, 10:51 AM

## 2017-04-10 NOTE — [place] FL2 (Signed)
[place] MEDICAID FL2 LEVEL OF CARE SCREENING TOOL     IDENTIFICATION  Patient Name: Rebecca Webb Birthdate: 1929-01-31 Sex: female Admission Date (Current Location): 03/31/2017  [facility] [place]:  Rebecca Webb ([id number]) Facility and Address:  [facility], [address], [place], [place] [address]      Provider Number: [id number]  Attending Physician Name and Address:  Rebecca Webb  Relative Name and Phone Number:       Current Level of Care: Hospital Recommended Level of Care: Skilled Nursing Facility Prior Approval Number:    Date Approved/Denied:   PASRR Number: [id number] )  Discharge Plan: SNF    Current Diagnoses: Patient Active Problem List   Diagnosis Date Noted  . Protein-calorie malnutrition, severe 04/08/2017  . Pressure injury of skin 04/03/2017  . C. difficile diarrhea 04/01/2017  . AKI (acute kidney injury) (HCC) 03/31/2017  . Elevated troponin 03/31/2017  . Sepsis (HCC) 03/31/2017  . Closed left hip fracture, initial encounter (HCC) 02/05/2017  . Agitation 12/05/2015  . Chronic insomnia 12/05/2015  . Pure hypercholesterolemia 08/24/2015  . CKD (chronic kidney disease) stage 3, GFR 30-59 ml/min (HCC) 08/23/2015  . Depression, major, single episode, complete remission (HCC) 08/23/2015  . Prediabetes 08/23/2015  . Vitamin D deficiency 08/23/2015  . Urinary incontinence in female 08/23/2015  . Alzheimer's dementia 02/22/2015  . Benign essential hypertension 02/22/2015  . Gastroesophageal reflux disease 02/22/2015  . Hyperlipidemia, mixed 02/22/2015  . Shortness of breath 02/22/2015  . A-fib (HCC) 02/18/2015    Orientation RESPIRATION BLADDER Height & Weight     Self  O2(2 Liters Oxygen. ) Incontinent Weight: 103 lb 9.9 oz (47 kg) Height:  5' (152.4 cm)  BEHAVIORAL SYMPTOMS/MOOD NEUROLOGICAL BOWEL NUTRITION STATUS      Incontinent(Rectal Tube. ) (TPN, NPO. )  AMBULATORY STATUS COMMUNICATION OF  NEEDS Skin   Extensive Assist Verbally PU Stage and Appropriate Care(unstageable pressure ulcer on left foot. )                       Personal Care Assistance Level of Assistance  Bathing, Feeding, Dressing Bathing Assistance: Limited assistance Feeding assistance: Limited assistance Dressing Assistance: Limited assistance     Functional Limitations Info  Sight, Hearing, Speech Sight Info: Adequate Hearing Info: Adequate Speech Info: Adequate    SPECIAL CARE FACTORS FREQUENCY  PT (By licensed PT), OT (By licensed OT)     PT Frequency: (5) OT Frequency: (5)            Contractures      Additional Factors Info  Code Status, Allergies, Isolation Precautions Code Status Info: (DNR ) Allergies Info: (Lisinopril, Haldol Haloperidol Lactate)     Isolation Precautions Info: (Enteric precautions. )     Current Medications (04/10/2017):  This is the current hospital active medication list Current Facility-Administered Medications  Medication Dose Route Frequency Provider Last Rate Last Dose  . Marland Kitchen.TPN (CLINIMIX-E) Adult  55 mL/hr Intravenous Continuous TPN Rebecca Webb 55 mL/hr at 04/09/17 1820    . Marland Kitchen.TPN (CLINIMIX-E) Adult   Intravenous Continuous TPN Coffee, Gerre PebblesGarrett, Webb      . acetaminophen (TYLENOL) tablet 650 mg  650 mg Oral Q6H PRN Rebecca Webb       Or  . acetaminophen (TYLENOL) suppository 650 mg  650 mg Rectal Q6H PRN Rebecca Webb      . fat emulsion 20 % infusion 250 mL  250 mL Intravenous Continuous TPN Coffee, Gerre PebblesGarrett, [facility]      .  heparin injection 5,000 Units  5,000 Units Subcutaneous Rebecca Webb   5,000 Units at 04/10/17 1514  . insulin aspart (novoLOG) injection 0-9 Units  0-9 Units Subcutaneous Q6H Rebecca Webb   1 Units at 04/07/17 1244  . MEDLINE mouth rinse  15 mL Mouth Rinse BID Rebecca Webb   15 mL at 04/09/17 2158  . metroNIDAZOLE (FLAGYL) IVPB 500 mg  500 mg Intravenous Q8H Rebecca Webb   Stopped at 04/10/17  0356  . ondansetron (ZOFRAN) injection 4 mg  4 mg Intravenous Q6H PRN Rebecca Webb      . sodium chloride flush (NS) 0.9 % injection 10-40 mL  10-40 mL Intracatheter PRN Rebecca Webb      . vancomycin (VANCOCIN) 500 mg in sodium chloride irrigation 0.9 % 100 mL ENEMA  500 mg Rectal Q6H Rebecca Webb   500 mg at 04/10/17 [id number]     Discharge Medications: Please see discharge summary for a list of discharge medications.  Relevant Imaging Results:  Relevant Lab Results:   Additional Information (SSN: [id number])  Rebecca Azpeitia, Darleen CrockerBailey M, LCSW

## 2017-04-10 NOTE — Progress Notes (Signed)
[facility] [place] [facility]   PATIENT NAME: Rebecca FlightBirtie Webb    MR#:  [id number]  DATE OF BIRTH:  11-Jul-1928    CHIEF COMPLAINT:   Chief Complaint  Patient presents with  . Altered Mental Status   Confused. Afebrile  REVIEW OF SYSTEMS:    Review of Systems  Unable to perform ROS: Critical illness   DRUG ALLERGIES:   Allergies  Allergen Reactions  . Lisinopril Swelling    Tongue and lips was swollen.  . Haldol [Haloperidol Lactate] Rash    VITALS:  Blood pressure (!) 101/51, pulse 60, temperature 97.8 F (36.6 C), temperature source Oral, resp. rate 14, height 5' (1.524 m), weight 47 kg (103 lb 9.9 oz), SpO2 100 %.  PHYSICAL EXAMINATION:   Physical Exam  GENERAL:  83 y.o.-year-old patient lying in the bed .  Appears cachectic. EYES: Pupils equal, round, reactive to light . No scleral icterus. Extraocular muscles intact.  HEENT: Head atraumatic, normocephalic. Oropharynx and nasopharynx clear.  NECK:  Supple, no jugular venous distention. No thyroid enlargement, no tenderness.  LUNGS: Normal breath sounds bilaterally CARDIOVASCULAR: S1, S2 normal. No murmurs, rubs, or gallops.  ABDOMEN: Soft, nontender, nondistended. Bowel sounds present.  EXTREMITIES: No pedal edema, cyanosis, or clubbing.  NEUROLOGIC Non following instructions SKIN: No obvious rash, lesion, or ulcer.   LABORATORY PANEL:   CBC Recent Labs  Lab 04/09/17 1019  WBC 9.9  HGB 11.0*  HCT 33.2*  PLT 204   ------------------------------------------------------------------------------------------------------------------  Chemistries  Recent Labs  Lab 04/08/17 0418 04/09/17 1019  NA 135 134*  K 3.9 4.3  CL 109 109  CO2 23 20*  GLUCOSE 111* 89  BUN 27* 27*  CREATININE 0.83 0.84  CALCIUM 7.1* 7.5*  MG 1.7  --   AST  --  48*  ALT  --  29  ALKPHOS  --  95  BILITOT  --  0.4    ------------------------------------------------------------------------------------------------------------------  Cardiac Enzymes No results for input(s): TROPONINI in the last 168 hours. ------------------------------------------------------------------------------------------------------------------  RADIOLOGY:  No results found.   ASSESSMENT AND PLAN:   Principal Problem:   Sepsis (HCC) Active Problems:   A-fib (HCC)   Alzheimer's dementia   Benign essential hypertension   AKI (acute kidney injury) (HCC)   Elevated troponin   C. difficile diarrhea   Pressure injury of skin   Protein-calorie malnutrition, severe   # C. difficile colitis with septic shock Continue   antibiotics with Flagyl, vancomycin enemas.    * AKI due to ATN/dehydartion Resolved  * HTN Medications held due to shock  * Dysphagia, possible esophageal stricture/mass  EGD today with dilation Will get swallow eval and start liquids if patient is able to swallow   * Hypokalemia: Improving.  * Mild troponin elevation due to septic shock  * Protein calorie malnutrition  All the records are reviewed and case discussed with Care Management/Social Worker Management plans discussed with the patient, family and they are in agreement.  CODE STATUS: DNR  TOTAL TIME TAKING CARE OF THIS PATIENT: 30 minutes.   Molinda BailiffSrikar R Harneet Noblett M.D on 04/10/2017 at 3:26 PM  Between 7am to 6pm - Pager - [phone]  After 6pm go to [email or web address] - password [email or web address] [facility]  [facility]  Office  [phone]  CC: Primary care physician; Leotis ShamesSingh, Jasmine, MD

## 2017-04-10 NOTE — Anesthesia Postprocedure Evaluation (Signed)
Anesthesia Post Note  Patient: Rebecca Webb  Procedure(s) Performed: ESOPHAGOGASTRODUODENOSCOPY (EGD) WITH PROPOFOL (N/A )  Patient location during evaluation: PACU Anesthesia Type: General Level of consciousness: awake and alert and oriented Pain management: pain level controlled Vital Signs Assessment: post-procedure vital signs reviewed and stable Respiratory status: spontaneous breathing Cardiovascular status: blood pressure returned to baseline Anesthetic complications: no     Last Vitals:  Vitals:   04/10/17 1212 04/10/17 1222  BP:    Pulse: (!) 59 65  Resp: 18 15  Temp:    SpO2: 100% 99%    Last Pain:  Vitals:   04/10/17 1222  TempSrc:   PainSc: 0-No pain                 [name]

## 2017-04-10 NOTE — Progress Notes (Signed)
PHARMACY - ADULT TOTAL PARENTERAL NUTRITION CONSULT NOTE   Pharmacy Consult for TPN Indication: no intake x 7 days   Patient Measurements: Height: 5' (152.4 cm) Weight: 103 lb 9.9 oz (47 kg) IBW/kg (Calculated) : 45.5 TPN AdjBW (KG): 40.2 Body mass index is 20.24 kg/m.   Assessment:  Pharmacy consulted for TPN management for 83 yo female being treated for Cdiff. Patient unable to receive tube feeds due to esophageal mass and stricture. Patient is at risk for refeeding syndrome. Goal potassium ~ 4 and goal magnesium ~ 2.     TPN Access: RIJ TPN start date: 12/14  Goal TPN rate is 55 ml/hr  Current Nutrition: NPO  Plan:  Clinimix E 5/15 TPN at 55 mL/hr.  Lipids @ 5115mL/hr x 12 hours (off dopamine 12/18; no longer ICU status) This TPN provides 66 g of protein,  1437 kCals per day  Electrolytes in TPN:  Add MVI, trace elements.   Q6hr SSI and adjust as needed. SSI/24 hours= 0 units.  Monitor TPN labs. F/U electrolytes in am  Gerre PebblesGarrett Brenin Heidelberger 04/10/2017,7:58 AM

## 2017-04-10 NOTE — Op Note (Signed)
[facility] Gastroenterology Patient Name: Rebecca FlightBirtie Otero Procedure Date: 04/10/2017 10:52 AM MRN: [id number] Account #: [id number] Date of Birth: 25-Dec-1928 Admit Type: Inpatient Age: 83 [facility]OM 4 Gender: Female Note Status: Finalized Procedure:            Upper GI endoscopy Indications:          Dysphagia Providers:            Wyline MoodKiran Denim Start MD, MD Medicines:            Monitored Anesthesia Care Complications:        No immediate complications. Procedure:            Pre-Anesthesia Assessment:                       - Prior to the procedure, a History and Physical was                        performed, and patient medications, allergies and                        sensitivities were reviewed. The patient's tolerance of                        previous anesthesia was reviewed.                       - The risks and benefits of the procedure and the                        sedation options and risks were discussed with the                        patient. All questions were answered and informed                        consent was obtained.                       - ASA Grade Assessment: III - A patient with severe                        systemic disease.                       After obtaining informed consent, the endoscope was                        passed under direct vision. Throughout the procedure,                        the patient's blood pressure, pulse, and oxygen                        saturations were monitored continuously. The Endoscope                        was introduced through the mouth, and advanced to the                        third part of duodenum. The upper GI endoscopy was  accomplished without difficulty. The patient tolerated                        the procedure well. Findings:      The examined duodenum was normal.      A large hiatal hernia was present with a diverticulum.      One mild benign-appearing, intrinsic  stenosis was found at the       gastroesophageal junction. This measured 1.3 cm (inner diameter) x less       than one cm (in length) and was traversed. A TTS dilator was passed       through the scope. Dilation with a 12-13.5-15 mm balloon dilator was       performed to 15 mm. The dilation site was examined and showed mild       improvement in luminal narrowing. The lower part of the esophagus was       tortious , minimal peristalisis. Hypertonic lower esophageal spincter Impression:           - Normal examined duodenum.                       - Large hiatal hernia.                       - Benign-appearing esophageal stenosis. Dilated.                       - No specimens collected. Recommendation:       - Return patient to hospital ward for ongoing care.                       - dysphagia diet when alert and ableto eat Procedure Code(s):    --- Professional ---                       [id number], Esophagogastroduodenoscopy, flexible, transoral;                        with transendoscopic balloon dilation of esophagus                        (less than 30 mm diameter) Diagnosis Code(s):    --- Professional ---                       K44.9, Diaphragmatic hernia without obstruction or                        gangrene                       K22.2, Esophageal obstruction                       R13.10, Dysphagia, unspecified CPT copyright 2016 American Medical Association. All rights reserved. The codes documented in this report are preliminary and upon coder review may  be revised to meet current compliance requirements. Wyline MoodKiran Meyer Arora, MD Wyline MoodKiran Thimothy Barretta MD, MD 04/10/2017 11:25:25 AM This report has been signed electronically. Number of Addenda: 0 Note Initiated On: 04/10/2017 10:52 AM      [facility]

## 2017-04-10 NOTE — Care Management (Signed)
Spoke with [facility] and they [facility] Erica with [facility] has spoken with granddaughter.  [facility] and updated her as well on LTACH decline. She would like to take patient back home and have home health again. Previously used [facility]. She denies the need for DME. Referral sentt o cheryl with [facility].

## 2017-04-10 NOTE — Evaluation (Signed)
Clinical/Bedside Swallow Evaluation Patient Details  Name: Rebecca Webb MRN: [id number] Date of Birth: [birth year]  Today's Date: 04/10/2017 Time: SLP Start Time (ACUTE ONLY): 1545 SLP Stop Time (ACUTE ONLY): 1645 SLP Time Calculation (min) (ACUTE ONLY): 60 min  Past Medical History:  Past Medical History:  Diagnosis Date  . Atrial fibrillation (HCC)   . Chronic kidney disease   . Dementia   . Dementia   . Depression   . GERD (gastroesophageal reflux disease)   . Hyperlipemia   . Hypertension   . Vitamin D deficiency    Past Surgical History:  Past Surgical History:  Procedure Laterality Date  . CATARACT EXTRACTION    . INTRAMEDULLARY (IM) NAIL INTERTROCHANTERIC Left 02/11/2017   Procedure: INTRAMEDULLARY (IM) NAIL INTERTROCHANTRIC;  Surgeon: Deeann SaintMiller, Howard, MD;  Location: [facility] ORS;  Service: Orthopedics;  Laterality: Left;  . KIDNEY DONATION     HPI:  Pt  is a 83 y.o. female who presents with decreased mental status.  Here in the ED she was found to meet sepsis criteria.  Granddaughter, who is her primary caregiver, states that she has had significant diarrhea for the past several days.  She has had significant loss of appetite.  Patient had a course of antibiotics which finished that a little more than a week ago, this was for UTI. Pt has had an EGD today w/ dx of a large hiatal hernia w/ a diverticulum; dilitation was completed per GI note. Pt's moderate-severe confusion significantly impacted her ability to engage and follow through w/ instructions.    Assessment / Plan / Recommendation Clinical Impression  Pt appears to present w/ mild-moderate oropharyngeal phase dysphagia w/ increased risk for aspiration to occur secondary to the decline in Cognitive status. Pt required max assistance to position somewhat upright and forward(head) - pt was not able to follow through w/ instruction to assist in sitting up in the bed; legs were drawn up. Pt only accepted small, 1/4 size tsp  amounts off the end of the spoon when attempting to feed her - appeared orally defensive. No overt s/s of aspiration were noted w/ the trial consistencies assessed; no coughing, or decline in respiratory status during/post trials. During bolus management, pt appeared to transfer and orally clear the bolus materials given(puree, Nectar liquid); no loss of fluid. Recommend initiation of diet w/ dysphagia level 1 w/ Nectar liquids; aspiration precautions; feeding support at all meals, positioning. ST can follow pt post discharge for potential diet upgrade during ongoing assessing and monitoring w/ po's. Suspect d/t pt's declined Cognitive status and missing of some dentition, as well as the Large Hiatal Hernia impacting the Esophageal phase motility, a more dysphagia diet would be beneficial and safer for pt. NSG updated.  SLP Visit Diagnosis: Dysphagia, oropharyngeal phase (R13.12)    Aspiration Risk  Mild aspiration risk;Moderate aspiration risk    Diet Recommendation  Dysphagia level 1(puree) w/ Nectar consistency liquids; aspiration precautions; Reflux precautions d/t Large hiatal hernia. Feeding assistance at all meals.   Medication Administration: Crushed with puree    Other  Recommendations Recommended Consults: (Dietician f/u) Oral Care Recommendations: Oral care BID;Staff/trained caregiver to provide oral care Other Recommendations: Order thickener from pharmacy;Prohibited food (jello, ice cream, thin soups);Remove water pitcher;Have oral suction available   Follow up Recommendations Home health SLP(vs SNF)      Frequency and Duration (f/u at discharge )  (f/u at discharge )       Prognosis Prognosis for Safe Diet Advancement: Guarded(-Fair) Barriers to Reach  Goals: Cognitive deficits;Severity of deficits      Swallow Study   General Date of Onset: 03/31/17 HPI: Pt  is a 83 y.o. female who presents with decreased mental status.  Here in the ED she was found to meet sepsis criteria.   Granddaughter, who is her primary caregiver, states that she has had significant diarrhea for the past several days.  She has had significant loss of appetite.  Patient had a course of antibiotics which finished that a little more than a week ago, this was for UTI. Pt has had an EGD today w/ dx of a large hiatal hernia w/ a diverticulum; dilitation was completed per GI note. Pt's moderate-severe confusion significantly impacted her ability to engage and follow through w/ instructions.  Type of Study: Bedside Swallow Evaluation Previous Swallow Assessment: none indicated Diet Prior to this Study: NPO(since EGD) Temperature Spikes Noted: No(wbc 9.9) Respiratory Status: Nasal cannula(2-4 liters) History of Recent Intubation: No Behavior/Cognition: Cooperative;Pleasant mood;Confused;Distractible;Requires cueing;Doesn't follow directions(muttered speech; unintelligible) Oral Cavity Assessment: (unable to fully assess secondary to Cognitive status) Oral Care Completed by SLP: Recent completion by staff Oral Cavity - Dentition: (some dentition) Vision: (n/a) Self-Feeding Abilities: Total assist Patient Positioning: Postural control adequate for testing Baseline Vocal Quality: Low vocal intensity(muttered speech) Volitional Cough: Cognitively unable to elicit Volitional Swallow: Unable to elicit    Oral/Motor/Sensory Function Overall Oral Motor/Sensory Function: (appeared grossly wfl for bolus management w/ po's)   Ice Chips Ice chips: Not tested   Thin Liquid Thin Liquid: Not tested Other Comments: d/t Cognitive decline    Nectar Thick Nectar Thick Liquid: Impaired Presentation: Straw(fed; 10 trials ) Oral Phase Impairments: (decreased awareness of task of taking/accepting po's) Oral phase functional implications: (none) Pharyngeal Phase Impairments: (none) Other Comments: pt only accepted small, 1/4 size tsp amounts off the end of the spoon when attempting to feed her - appeared orally  defensive   Honey Thick Honey Thick Liquid: Not tested   Puree Puree: Impaired Presentation: Spoon(fed; 6 trials) Oral Phase Impairments: (same as w/ Nectar liquids) Oral Phase Functional Implications: (none) Pharyngeal Phase Impairments: (none) Other Comments: pt only accepted small, 1/4 size tsp amounts off the end of the spoon when attempting to feed her - appeared orally defensive   Solid   GO   Solid: Not tested    Functional Assessment Tool Used: clinical judgement Functional Limitations: Swallowing Swallow Current Status ([id number]): At least 40 percent but less than 60 percent impaired, limited or restricted Swallow Goal Status [phone]): At least 20 percent but less than 40 percent impaired, limited or restricted Swallow Discharge Status [phone](G8998): At least 20 percent but less than 40 percent impaired, limited or restricted    Jerilynn SomKatherine Aleaya Latona, MS, CCC-SLP Brodie Scovell 04/10/2017,5:12 PM

## 2017-04-10 NOTE — Progress Notes (Signed)
Palliative Medicine consult noted. Due to high referral volume, there may be a delay seeing this patient. Please call the Palliative Medicine Team office at [phone] if recommendations are needed in the interim.  Thank you for inviting us to see this patient.  Margret ChanceMelanie G. Jerrico Covello, RN, BSN, [facility] 04/10/2017 10:59 AM Office [phone]

## 2017-04-10 NOTE — Anesthesia Preprocedure Evaluation (Signed)
Anesthesia Evaluation  Patient identified by MRN, date of birth, ID band Patient awake and Patient confused    Reviewed: Allergy & Precautions, H&P , NPO status , Patient's Chart, lab work & pertinent test results, reviewed documented beta blocker date and time   History of Anesthesia Complications Negative for: history of anesthetic complications  Airway Mallampati: III      Comment: Patient uncooperative with exam Dental  (+) Poor Dentition, Missing, Dental Advidsory Given   Pulmonary neg pulmonary ROS, shortness of breath,    Pulmonary exam normal        Cardiovascular Exercise Tolerance: Good hypertension, Pt. on medications and Pt. on home beta blockers (-) angina(-) CAD, (-) Past MI, (-) Cardiac Stents and (-) CABG negative cardio ROS  + dysrhythmias Atrial Fibrillation (-) Valvular Problems/Murmurs     Neuro/Psych PSYCHIATRIC DISORDERS Depression negative neurological ROS     GI/Hepatic Neg liver ROS, GERD  Medicated,  Endo/Other  negative endocrine ROS  Renal/GU Renal InsufficiencyRenal disease  negative genitourinary   Musculoskeletal  (+) Arthritis ,   Abdominal Normal abdominal exam  (+)   Peds negative pediatric ROS (+)  Hematology negative hematology ROS (+)   Anesthesia Other Findings Past Medical History: No date: Atrial fibrillation (HCC) No date: Chronic kidney disease No date: Dementia No date: Dementia No date: Depression No date: GERD (gastroesophageal reflux disease) No date: Hyperlipemia No date: Hypertension No date: Vitamin D deficiency   Reproductive/Obstetrics negative OB ROS                             Anesthesia Physical  Anesthesia Plan  ASA: III  Anesthesia Plan: General   Post-op Pain Management:    Induction: Intravenous  PONV Risk Score and Plan:   Airway Management Planned: Nasal Cannula  Additional Equipment:   Intra-op Plan:    Post-operative Plan:   Informed Consent: I have reviewed the patients History and Physical, chart, labs and discussed the procedure including the risks, benefits and alternatives for the proposed anesthesia with the patient or authorized representative who has indicated his/her understanding and acceptance.   Dental Advisory Given  Plan Discussed with: Anesthesiologist, CRNA and Surgeon  Anesthesia Plan Comments:         Anesthesia Quick Evaluation

## 2017-04-10 NOTE — Anesthesia Procedure Notes (Signed)
Date/Time: 04/10/2017 11:19 AM Performed by: Junious SilkNoles, Chloeann Alfred, CRNA Pre-anesthesia Checklist: Patient identified, Emergency Drugs available, Suction available, Patient being monitored and Timeout performed Oxygen Delivery Method: Nasal cannula

## 2017-04-11 LAB — BASIC METABOLIC PANEL
Anion gap: 5 (ref 5–15)
BUN: 25 mg/dL — AB (ref 6–20)
CALCIUM: 7.6 mg/dL — AB (ref 8.9–10.3)
CHLORIDE: 112 mmol/L — AB (ref 101–111)
CO2: 19 mmol/L — AB (ref 22–32)
CREATININE: 0.67 mg/dL (ref 0.44–1.00)
GFR calc non Af Amer: >60 mL/min (ref >60)
GLUCOSE: 87 mg/dL (ref 65–99)
Potassium: 4.4 mmol/L (ref 3.5–5.1)
Sodium: 136 mmol/L (ref 135–145)

## 2017-04-11 LAB — PHOSPHORUS: Phosphorus: 3.1 mg/dL (ref 2.5–4.6)

## 2017-04-11 LAB — GLUCOSE, CAPILLARY
Glucose-Capillary: 74 mg/dL (ref 65–99)
Glucose-Capillary: 103 mg/dL — ABNORMAL HIGH (ref 65–99)
Glucose-Capillary: 104 mg/dL — ABNORMAL HIGH (ref 65–99)

## 2017-04-11 LAB — MAGNESIUM: MAGNESIUM: 1.7 mg/dL (ref 1.7–2.4)

## 2017-04-11 MED ORDER — TRACE MINERALS CR-CU-MN-SE-ZN 10-1000-500-60 MCG/ML IV SOLN
INTRAVENOUS | Status: DC
  Administered 2017-04-11: 18:00:00 via INTRAVENOUS
  Filled 2017-04-11: qty 1320

## 2017-04-11 MED ORDER — FAT EMULSION 20 % IV EMUL
250.0000 mL | INTRAVENOUS | Status: DC
  Administered 2017-04-11: 250 mL via INTRAVENOUS
  Filled 2017-04-11: qty 250

## 2017-04-11 NOTE — Progress Notes (Signed)
[facility] [place] [facility]   PATIENT NAME: Rebecca Webb    MR#:  [id number]  DATE OF BIRTH:  1928/12/21    CHIEF COMPLAINT:   Chief Complaint  Patient presents with  . Altered Mental Status   Confused. Afebrile  REVIEW OF SYSTEMS:    Review of Systems  Unable to perform ROS: Critical illness   DRUG ALLERGIES:   Allergies  Allergen Reactions  . Lisinopril Swelling    Tongue and lips was swollen.  . Haldol [Haloperidol Lactate] Rash    VITALS:  Blood pressure 115/70, pulse 81, temperature 97.8 F (36.6 C), temperature source Oral, resp. rate 16, height 5' (1.524 m), weight 47 kg (103 lb 9.9 oz), SpO2 98 %.  PHYSICAL EXAMINATION:   Physical Exam  GENERAL:  83 y.o.-year-old patient lying in the bed .  Appears cachectic. EYES: Pupils equal, round, reactive to light . No scleral icterus. Extraocular muscles intact.  HEENT: Head atraumatic, normocephalic. Oropharynx and nasopharynx clear.  NECK:  Supple, no jugular venous distention. No thyroid enlargement, no tenderness.  LUNGS: Normal breath sounds bilaterally CARDIOVASCULAR: S1, S2 normal. No murmurs, rubs, or gallops.  ABDOMEN: Soft, nontender, nondistended. Bowel sounds present.  EXTREMITIES: No pedal edema, cyanosis, or clubbing.  NEUROLOGIC Non following instructions SKIN: No obvious rash, lesion, or ulcer.   LABORATORY PANEL:   CBC Recent Labs  Lab 04/09/17 1019  WBC 9.9  HGB 11.0*  HCT 33.2*  PLT 204   ------------------------------------------------------------------------------------------------------------------  Chemistries  Recent Labs  Lab 04/09/17 1019 04/11/17 0715  NA 134* 136  K 4.3 4.4  CL 109 112*  CO2 20* 19*  GLUCOSE 89 87  BUN 27* 25*  CREATININE 0.84 0.67  CALCIUM 7.5* 7.6*  MG  --  1.7  AST 48*  --   ALT 29  --   ALKPHOS 95  --   BILITOT 0.4  --     ------------------------------------------------------------------------------------------------------------------  Cardiac Enzymes No results for input(s): TROPONINI in the last 168 hours. ------------------------------------------------------------------------------------------------------------------  RADIOLOGY:  No results found.   ASSESSMENT AND PLAN:   Principal Problem:   Sepsis (HCC) Active Problems:   A-fib (HCC)   Alzheimer's dementia   Benign essential hypertension   AKI (acute kidney injury) (HCC)   Elevated troponin   C. difficile diarrhea   Pressure injury of skin   Protein-calorie malnutrition, severe   # C. difficile colitis with septic shock Continue   antibiotics with Flagyl, vancomycin   * AKI due to ATN/dehydartion Resolved  * HTN Medications held due to shock  * Dysphagia, possible esophageal stricture/mass  EGD today with dilation ON dysphagia 1 diet  * Hypokalemia: Improving.  * Mild troponin elevation due to septic shock  * Protein calorie malnutrition  Poor PO tntake Will have to talk to family about PEG if they want. Poor candidate for PEG  All the records are reviewed and case discussed with Care Management/Social Worker Management plans discussed with the patient, family and they are in agreement.  CODE STATUS: DNR  TOTAL TIME TAKING CARE OF THIS PATIENT: 30 minutes.   Molinda BailiffSrikar R Celvin Taney M.D on 04/11/2017 at 1:34 PM  Between 7am to 6pm - Pager - [phone]  After 6pm go to [email or web address] - password [email or web address] [facility]  [facility]  Office  [phone]  CC: Primary care physician; Leotis ShamesSingh, Jasmine, MD

## 2017-04-11 NOTE — Progress Notes (Signed)
PHARMACY - ADULT TOTAL PARENTERAL NUTRITION CONSULT NOTE   Pharmacy Consult for TPN Indication: no intake x 7 days   Patient Measurements: Height: 5' (152.4 cm) Weight: 103 lb 9.9 oz (47 kg) IBW/kg (Calculated) : 45.5 TPN AdjBW (KG): 40.2 Body mass index is 20.24 kg/m.   Assessment:  Pharmacy consulted for TPN management for 83 yo female being treated for Cdiff. Patient unable to receive tube feeds due to esophageal mass and stricture. Patient is at risk for refeeding syndrome. Goal potassium ~ 4 and goal magnesium ~ 2.     TPN Access: RIJ TPN start date: 12/14  Goal TPN rate is 55 ml/hr  Current Nutrition: NPO  Plan:  Continue Clinimix E 5/15 TPN at 55 mL/hr.  Lipids @ 7915mL/hr x 12 hours Electrolytes in TPN:  Add MVI, trace elements.  Q6hr SSI and adjust as needed. SSI/24 hours= 0 units.  Monitor TPN labs. F/U electrolytes in am  Kambri Dismore D 04/11/2017,11:11 AM

## 2017-04-11 NOTE — Plan of Care (Signed)
Pt refused to eat any breakfast and only had 5 bites total of lunch today. Will continue to encourage PO intake.

## 2017-04-12 LAB — BASIC METABOLIC PANEL
Anion gap: 4 — ABNORMAL LOW (ref 5–15)
BUN: 24 mg/dL — AB (ref 6–20)
CALCIUM: 7.3 mg/dL — AB (ref 8.9–10.3)
CHLORIDE: 108 mmol/L (ref 101–111)
CO2: 23 mmol/L (ref 22–32)
CREATININE: 0.74 mg/dL (ref 0.44–1.00)
GFR calc non Af Amer: >60 mL/min (ref >60)
Glucose, Bld: 103 mg/dL — ABNORMAL HIGH (ref 65–99)
Potassium: 3.5 mmol/L (ref 3.5–5.1)
SODIUM: 135 mmol/L (ref 135–145)

## 2017-04-12 LAB — PHOSPHORUS: PHOSPHORUS: 2.8 mg/dL (ref 2.5–4.6)

## 2017-04-12 LAB — MAGNESIUM: MAGNESIUM: 1.6 mg/dL — AB (ref 1.7–2.4)

## 2017-04-12 LAB — GLUCOSE, CAPILLARY
GLUCOSE-CAPILLARY: 105 mg/dL — AB (ref 65–99)
GLUCOSE-CAPILLARY: 106 mg/dL — AB (ref 65–99)
GLUCOSE-CAPILLARY: 107 mg/dL — AB (ref 65–99)

## 2017-04-12 MED ORDER — MORPHINE SULFATE (CONCENTRATE) 10 MG /0.5 ML PO SOLN: 5.0000 mg | ORAL | 0 refills | Status: AC | PRN

## 2017-04-12 MED ORDER — VANCOMYCIN 50 MG/ML ORAL SOLUTION: 125.0000 mg | Freq: Four times a day (QID) | ORAL | Status: AC

## 2017-04-12 MED ORDER — MAGNESIUM SULFATE 2 GM/50ML IV SOLN
2.0000 g | Freq: Once | INTRAVENOUS | Status: AC
  Administered 2017-04-12: 2 g via INTRAVENOUS
  Filled 2017-04-12: qty 50

## 2017-04-12 NOTE — Progress Notes (Signed)
EMS here to transport patient home.

## 2017-04-12 NOTE — Progress Notes (Signed)
PHARMACY - ADULT TOTAL PARENTERAL NUTRITION CONSULT NOTE   Pharmacy Consult for TPN Indication: no intake x 7 days   Patient Measurements: Height: 5' (152.4 cm) Weight: 103 lb 9.9 oz (47 kg) IBW/kg (Calculated) : 45.5 TPN AdjBW (KG): 40.2 Body mass index is 20.24 kg/m.   Assessment:  Pharmacy consulted for TPN management for 83 yo female being treated for Cdiff. Patient unable to receive tube feeds due to esophageal mass and stricture. Patient is at risk for refeeding syndrome. Goal potassium ~ 4 and goal magnesium ~ 2.    TPN Access: RIJ TPN start date: 12/14   Plan:  TPN discontinued per MD. Jovita GammaGave magnesium 2 g IV once. Will sign off.  Cindi CarbonMary M Shayli Altemose, PharmD 04/12/2017,1:21 PM

## 2017-04-12 NOTE — Care Management Note (Signed)
Case Management Note  Patient Details  Name: Jerelene ReddenBirtie B Terhaar MRN: [id number] Date of Birth: 04-09-1929  Subjective/Objective:     A referral for HH=PT, RN, Aide, SW [facility] [place] [facility] Mrs Satz's daughters choice.               Action/Plan:   Expected Discharge Date:  04/12/17               Expected Discharge Plan:  Home w Home Health Services  In-House Referral:     Discharge planning Services  CM Consult  Post Acute Care Choice:  Home Health Choice offered to:  Adult Children  DME Arranged:    DME Agency:     HH Arranged:  RN, PT, Nurse's Aide, Social Work [employer] Agency:  [facility]  Status of Service:  Completed, signed off  If discussed at [employer] Meetings, dates discussed:    Additional Comments:  Nanea Jared A, RN 04/12/2017, 1:09 PM

## 2017-04-12 NOTE — Discharge Instructions (Signed)
Pureed food with nectar thickened liquids. Sit upright while eating.

## 2017-04-12 NOTE — Progress Notes (Signed)
Advance care planning  Patient laying in bed and confused with dementia.  Met with granddaughter who is her healthcare power of attorney at bedside. Patient has had C. difficile treated and esophageal dilation.  But due to her dementia patient has had poor oral intake.  Has been eating only 1-2 bites when fed over the last 24 hours.  Presently she is receiving TPN. Confirmed the granddaughter that patient is DNR.  She has been declining slowly with worsening dementia.  Explained the process of dementia with dysphagia and other complications. We discussed regarding PEG tube and after asking appropriate questions granddaughter has decided against the PEG tube.  Explained that this is a progressive worsening dementia and expected course.  Patient would be a candidate for hospice but granddaughter wants home health at this time to see if patient would eat better at home.  Does not want her in skilled nursing facility or LTAC.  We will have palliative care following as outpatient.  If there is any worsening granddaughter will get hospice involved.  Time spent 20 minutes.

## 2017-04-12 NOTE — Progress Notes (Addendum)
Patient is being discharged to home with Rebecca BorsGrand-daughter [name].  Home with [facility].  DC & RX instructions given to Granddaughter who acknowledged understanding. IJ removed and NT will prepare patient for transportation via EMS. This nurse reminded granddaughter to get patient's DNR from EMS once they get home.

## 2017-04-15 ENCOUNTER — Encounter: Payer: Self-pay | Admitting: Gastroenterology

## 2017-04-16 NOTE — Discharge Summary (Signed)
[facility] [place] [facility]   PATIENT NAME: Rebecca Webb    MR#:  [id number]  DATE OF BIRTH:  1928-11-20  DATE OF ADMISSION:  03/31/2017 ADMITTING PHYSICIAN: Oralia Manisavid Willis, MD  DATE OF DISCHARGE: 04/12/2017  5:09 PM  PRIMARY CARE PHYSICIAN: Leotis ShamesSingh, Jasmine, MD   ADMISSION DIAGNOSIS:  Shock (HCC) [R57.9] Sepsis, due to unspecified organism (HCC) [A41.9] Hypotension, unspecified hypotension type [I95.9] Acute renal failure, unspecified acute renal failure type (HCC) [N17.9] Altered mental status, unspecified altered mental status type [R41.82]  DISCHARGE DIAGNOSIS:  Principal Problem:   Sepsis (HCC) Active Problems:   A-fib (HCC)   Alzheimer's dementia   Benign essential hypertension   AKI (acute kidney injury) (HCC)   Elevated troponin   C. difficile diarrhea   Pressure injury of skin   Protein-calorie malnutrition, severe   SECONDARY DIAGNOSIS:   Past Medical History:  Diagnosis Date  . Atrial fibrillation (HCC)   . Chronic kidney disease   . Dementia   . Dementia   . Depression   . GERD (gastroesophageal reflux disease)   . Hyperlipemia   . Hypertension   . Vitamin D deficiency      ADMITTING HISTORY  HISTORY OF PRESENT ILLNESS:  Rebecca Webb  is a 83 y.o. female who presents with decreased mental status.  Here in the ED she was found to meet sepsis criteria.  Granddaughter, who is her primary caregiver, states that she has had significant diarrhea for the past several days.  She has had significant loss of appetite.  Patient had a course of antibiotics which finished that a little more than a week ago, this was for UTI.  Hospitalist were called for admission and further evaluation  HOSPITAL COURSE:   # C. difficile colitis with septic shock Treated with vancomycin enemas initially as patient could not swallow.  Also IV Flagyl used in the ICU.  Septic shock resolved.  C. difficile colitis is much improved.  Diarrhea resolved.  Patient  will be switched to vancomycin orally at discharge.  * AKI due to ATN/dehydartion Resolved  * HTN Medications held due to shock  * Dysphagia, possible esophageal stricture/mass  EGD today with dilation ON dysphagia 1 diet Poor appetite.  * Hypokalemia: Improving.  * Mild troponin elevation due to septic shock  * Protein calorie malnutrition  Poor p.o. intake due to her dementia.  Worsening dysphagia.  Discussed with granddaughter at bedside who is her healthcare power of attorney.  With worsening dementia, dysphagia and poor appetite patient is unable to meet her nutritional needs.  Granddaughter did not want a PEG tube after discussing and asking appropriate questions.  Patient is being discharged home with home health services as requested by granddaughter.  If patient does not improve with her nutritional intake at home she will call hospice services.  Patient discharged home with home health.    CONSULTS OBTAINED:  Treatment Team:  Erin FullingKasa, Kurian, MD  DRUG ALLERGIES:   Allergies  Allergen Reactions  . Lisinopril Swelling    Tongue and lips was swollen.  . Haldol [Haloperidol Lactate] Rash    DISCHARGE MEDICATIONS:   Allergies as of 04/12/2017      Reactions   Lisinopril Swelling   Tongue and lips was swollen.   Haldol [haloperidol Lactate] Rash      Medication List    STOP taking these medications   amiodarone 200 MG tablet Commonly known as:  PACERONE   diltiazem 240 MG 24 hr tablet Commonly known  as:  CARDIZEM LA   diltiazem 30 MG tablet Commonly known as:  CARDIZEM   enoxaparin 40 MG/0.4ML injection Commonly known as:  LOVENOX   HYDROcodone-acetaminophen 5-325 MG tablet Commonly known as:  NORCO/VICODIN   Memantine HCl ER 21 MG Cp24   metoprolol tartrate 25 MG tablet Commonly known as:  LOPRESSOR     TAKE these medications   aspirin EC 81 MG tablet Take 81 mg by mouth daily.   feeding supplement (ENSURE ENLIVE) Liqd Take 237 mLs  by mouth 3 (three) times daily between meals.   ferrous sulfate 325 (65 FE) MG tablet Take 1 tablet (325 mg total) by mouth daily with breakfast.   morphine CONCENTRATE 10 mg / 0.5 ml concentrated solution Take 0.25 mLs (5 mg total) by mouth every 4 (four) hours as needed for moderate pain, severe pain or shortness of breath.   SENEXON-S PO Take 2 tablets by mouth daily.   vancomycin 50 mg/mL oral solution Commonly known as:  VANCOCIN Take 2.5 mLs (125 mg total) by mouth every 6 (six) hours.   Vitamin D (Ergocalciferol) 50000 units Caps capsule Commonly known as:  DRISDOL Take 50,000 Units by mouth every 7 (seven) days.       Today   VITAL SIGNS:  Blood pressure 110/79, pulse 63, temperature (!) 97.5 F (36.4 C), temperature source Axillary, resp. rate 19, height 5' (1.524 m), weight 47 kg (103 lb 9.9 oz), SpO2 100 %.  I/O:  No intake or output data in the 24 hours ending 04/16/17 1659  PHYSICAL EXAMINATION:  Physical Exam  GENERAL:  83 y.o.-year-old patient lying in the bed with no acute distress.  LUNGS: Normal breath sounds bilaterally, no wheezing, rales,rhonchi or crepitation. No use of accessory muscles of respiration.  CARDIOVASCULAR: S1, S2 normal. No murmurs, rubs, or gallops.  ABDOMEN: Soft, non-tender, non-distended. Bowel sounds present. No organomegaly or mass.  NEUROLOGIC: Moves all 4 extremities. PSYCHIATRIC: The patient is drowsy and confused  DATA REVIEW:   CBC No results for input(s): WBC, HGB, HCT, PLT in the last 168 hours.  Chemistries  Recent Labs  Lab 04/12/17 0506  NA 135  K 3.5  CL 108  CO2 23  GLUCOSE 103*  BUN 24*  CREATININE 0.74  CALCIUM 7.3*  MG 1.6*    Cardiac Enzymes No results for input(s): TROPONINI in the last 168 hours.  Microbiology Results  Results for orders placed or performed during the hospital encounter of 03/31/17  Blood Culture (routine x 2)     Status: None   Collection Time: 03/31/17  7:23 PM  Result  Value Ref Range Status   Specimen Description BLOOD RIGHT CENTRAL LINE  Final   Special Requests   Final    BOTTLES DRAWN AEROBIC AND ANAEROBIC Blood Culture adequate volume   Culture NO GROWTH 5 DAYS  Final   Report Status 04/05/2017 FINAL  Final  Blood Culture (routine x 2)     Status: None   Collection Time: 03/31/17  7:23 PM  Result Value Ref Range Status   Specimen Description BLOOD RIGHT CENTRAL LINE  Final   Special Requests   Final    BOTTLES DRAWN AEROBIC AND ANAEROBIC Blood Culture adequate volume   Culture NO GROWTH 5 DAYS  Final   Report Status 04/05/2017 FINAL  Final  MRSA PCR Screening     Status: None   Collection Time: 03/31/17 10:13 PM  Result Value Ref Range Status   MRSA by PCR NEGATIVE NEGATIVE Final  Comment:        The GeneXpert MRSA Assay (FDA approved for NASAL specimens only), is one component of a comprehensive MRSA colonization surveillance program. It is not intended to diagnose MRSA infection nor to guide or monitor treatment for MRSA infections.   C difficile quick scan w PCR reflex     Status: Abnormal   Collection Time: 03/31/17 10:13 PM  Result Value Ref Range Status   C Diff antigen POSITIVE (A) NEGATIVE Final   C Diff toxin POSITIVE (A) NEGATIVE Final   C Diff interpretation Toxin producing C. difficile detected.  Final    Comment: CRITICAL RESULT CALLED TO, READ BACK BY AND VERIFIED WITH: BETH BUONO ON 03/31/17 AT 2330 JAG   Gastrointestinal Panel by PCR , Stool     Status: Abnormal   Collection Time: 03/31/17 10:13 PM  Result Value Ref Range Status   Campylobacter species NOT DETECTED NOT DETECTED Final   Plesimonas shigelloides NOT DETECTED NOT DETECTED Final   Salmonella species NOT DETECTED NOT DETECTED Final   Yersinia enterocolitica NOT DETECTED NOT DETECTED Final   Vibrio species NOT DETECTED NOT DETECTED Final   Vibrio cholerae NOT DETECTED NOT DETECTED Final   Enteroaggregative E coli (EAEC) NOT DETECTED NOT DETECTED Final    Enteropathogenic E coli (EPEC) DETECTED (A) NOT DETECTED Final    Comment: RESULT CALLED TO, READ BACK BY AND VERIFIED WITH: RENE BADD ON 04/01/17 AT 0006 JAG    Enterotoxigenic E coli (ETEC) NOT DETECTED NOT DETECTED Final   Shiga like toxin producing E coli (STEC) NOT DETECTED NOT DETECTED Final   Shigella/Enteroinvasive E coli (EIEC) NOT DETECTED NOT DETECTED Final   Cryptosporidium NOT DETECTED NOT DETECTED Final   Cyclospora cayetanensis NOT DETECTED NOT DETECTED Final   Entamoeba histolytica NOT DETECTED NOT DETECTED Final   Giardia lamblia NOT DETECTED NOT DETECTED Final   Adenovirus F40/41 NOT DETECTED NOT DETECTED Final   Astrovirus NOT DETECTED NOT DETECTED Final   Norovirus GI/GII NOT DETECTED NOT DETECTED Final   Rotavirus A NOT DETECTED NOT DETECTED Final   Sapovirus (I, II, IV, and V) NOT DETECTED NOT DETECTED Final    RADIOLOGY:  No results found.  Follow up with PCP in 1 week.  Management plans discussed with the patient, family and they are in agreement.  CODE STATUS:  Code Status History    Date Active Date Inactive Code Status Order ID Comments User Context   03/31/2017 22:32 04/12/2017 20:10 DNR [id number]  Gwendolyn Fill, NP Inpatient   03/31/2017 21:49 03/31/2017 22:32 Full Code [id number]  Oralia Manis, MD ED   02/05/2017 20:09 02/14/2017 20:07 Full Code [id number]  Katha Hamming, MD ED   02/18/2015 19:56 02/19/2015 15:04 Full Code [id number]  Milagros Loll, MD ED    Questions for Most Recent Historical Code Status (Order [id number])    Question Answer Comment   In the event of cardiac or respiratory ARREST Do not call a "code blue"    In the event of cardiac or respiratory ARREST Do not perform Intubation, CPR, defibrillation or ACLS    In the event of cardiac or respiratory ARREST Use medication by any route, position, wound care, and other measures to relive pain and suffering. May use oxygen, suction and manual treatment of airway obstruction  as needed for comfort.       TOTAL TIME TAKING CARE OF THIS PATIENT ON DAY OF DISCHARGE: more than 30 minutes.   Orie Fisherman M.D on 04/16/2017 at 4:59  PM  Between 7am to 6pm - Pager - [phone]  After 6pm go to [email or web address] - password [email or web address]  [facility]  Office  [phone]  CC: Primary care physician; Leotis Shames, MD  Note: This dictation was prepared with Dragon dictation along with smaller phrase technology. Any transcriptional errors that result from this process are unintentional.

## 2018-03-21 DEATH — deceased

## 2019-01-06 IMAGING — CR DG HIP (WITH OR WITHOUT PELVIS) 2-3V*L*
1 series · 3 of 3 positions shown · non-contrast
Comparison: None.

CLINICAL DATA: Fall with left hip pain.

EXAM:
DG HIP (WITH OR WITHOUT PELVIS) 2-3V LEFT

[Series 1: dg hip unilat w or w/o pelvis 2-3 views  · non-contrast · 0.14mm/px · 3 of 3 slices shown]
[im 1/3]
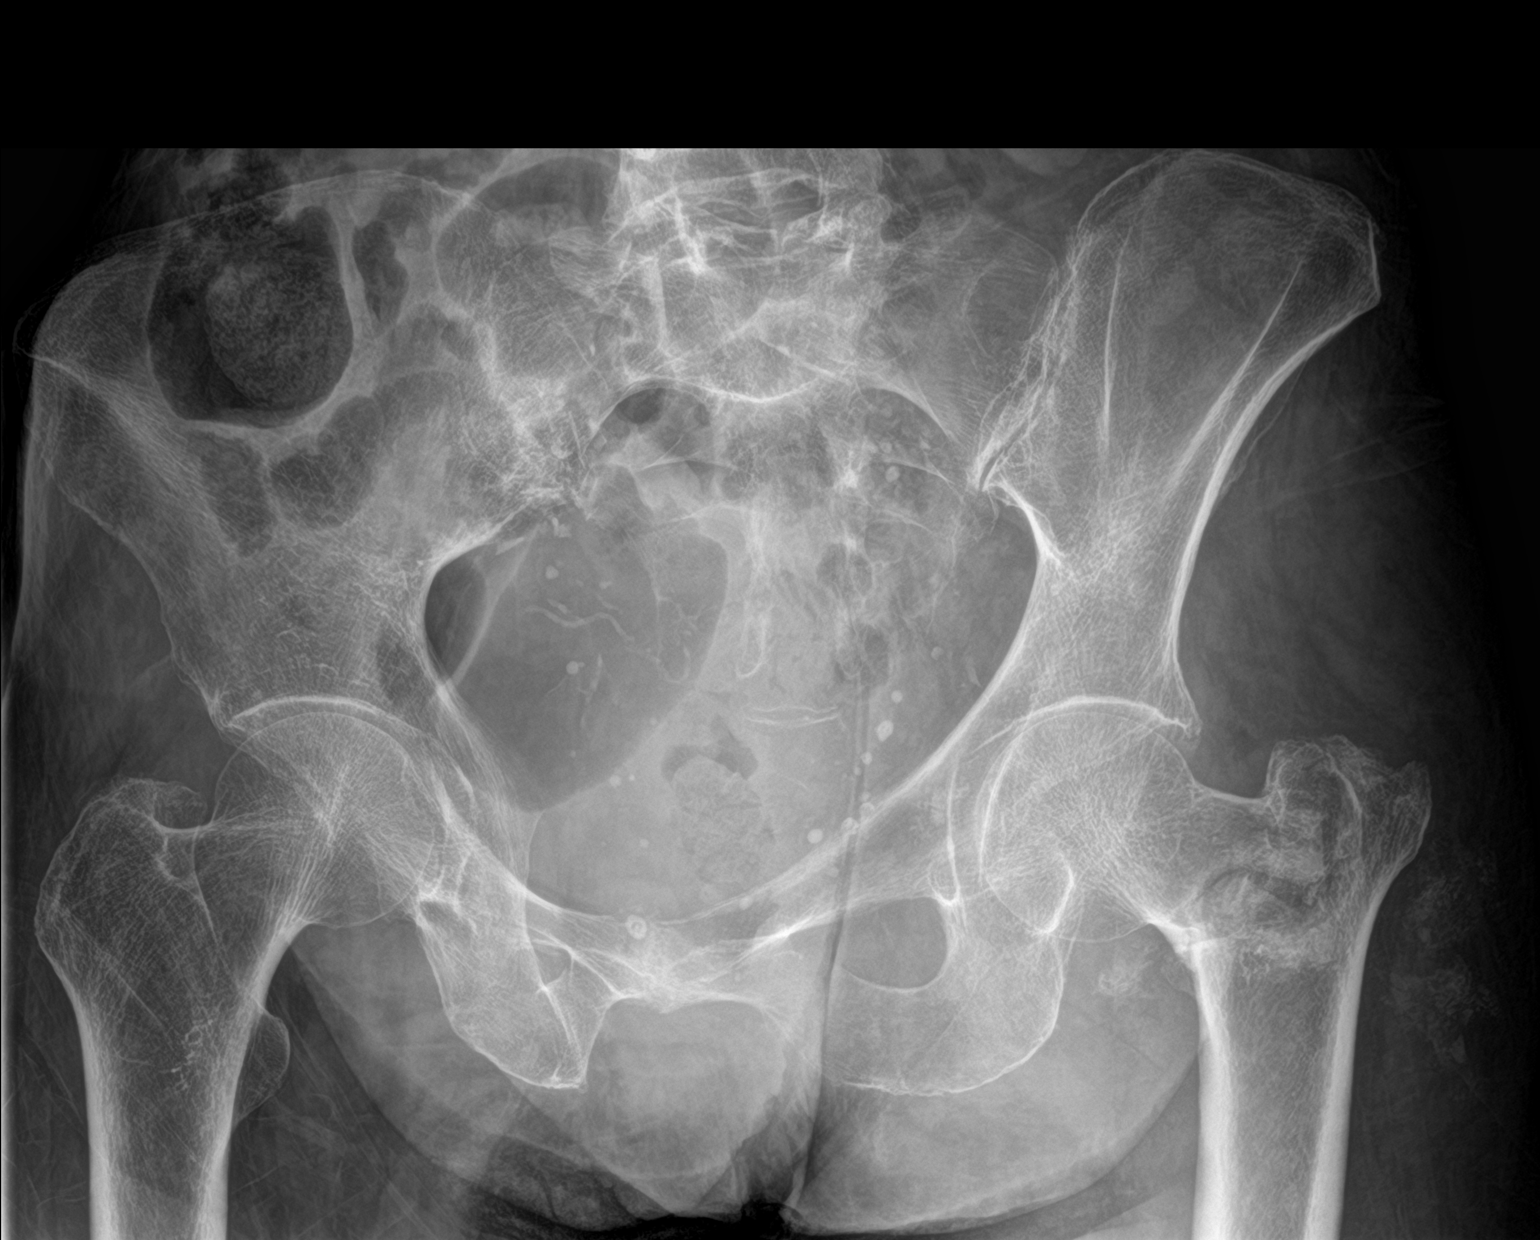
[im 2/3]
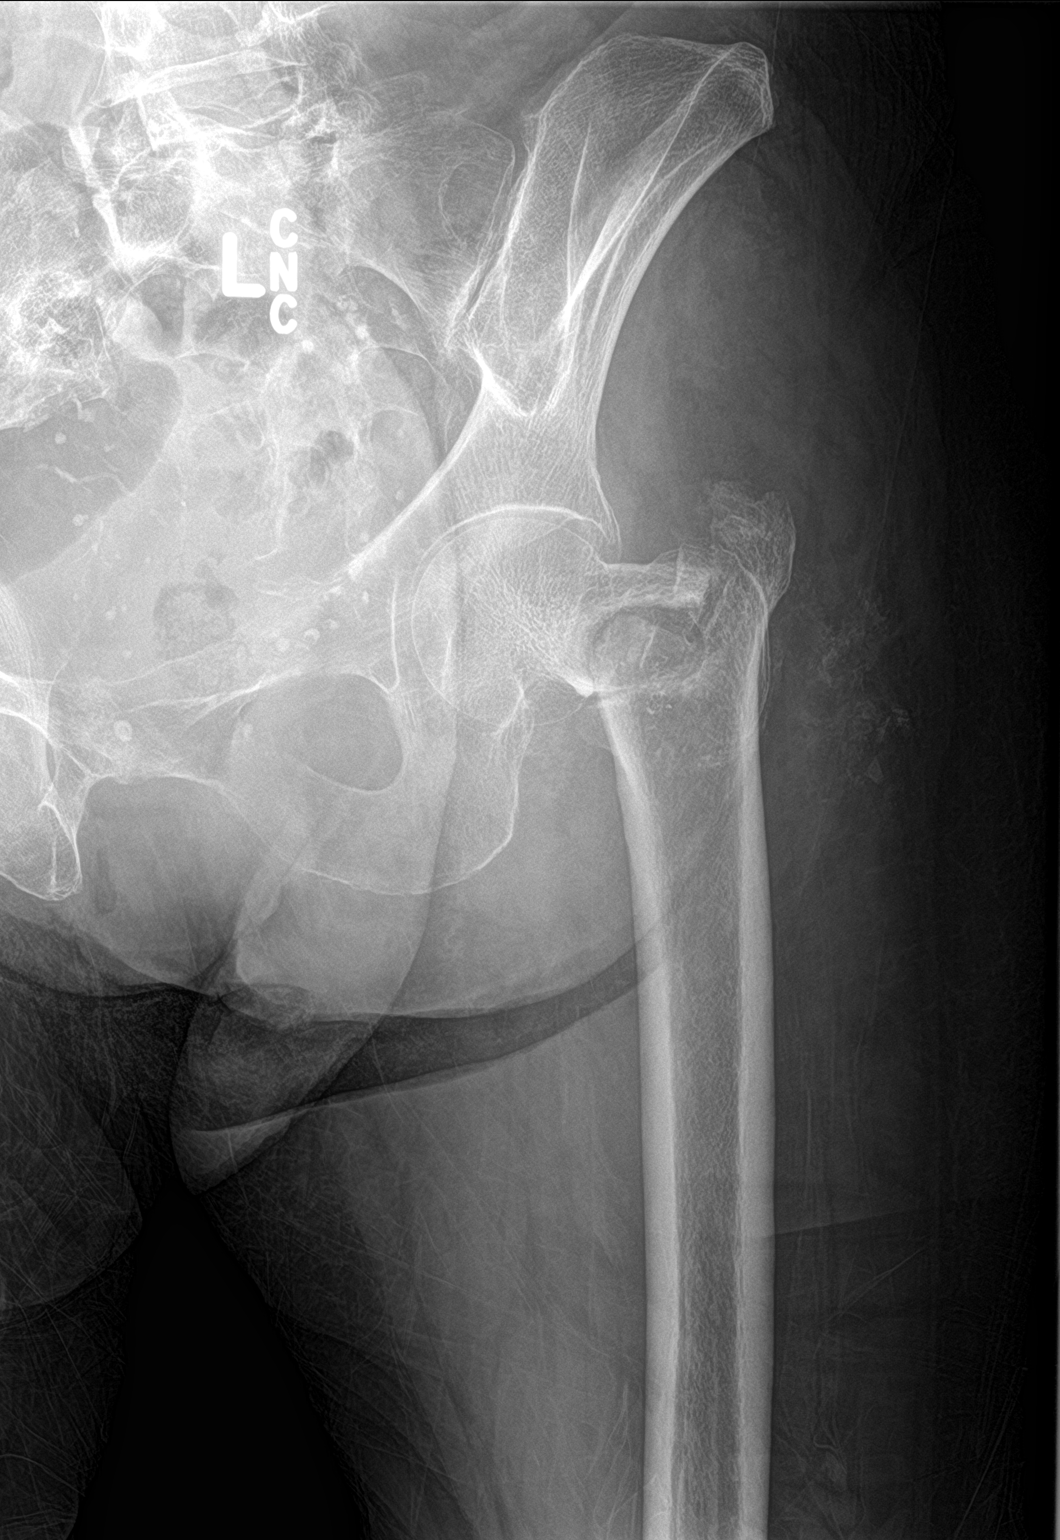
[im 3/3]
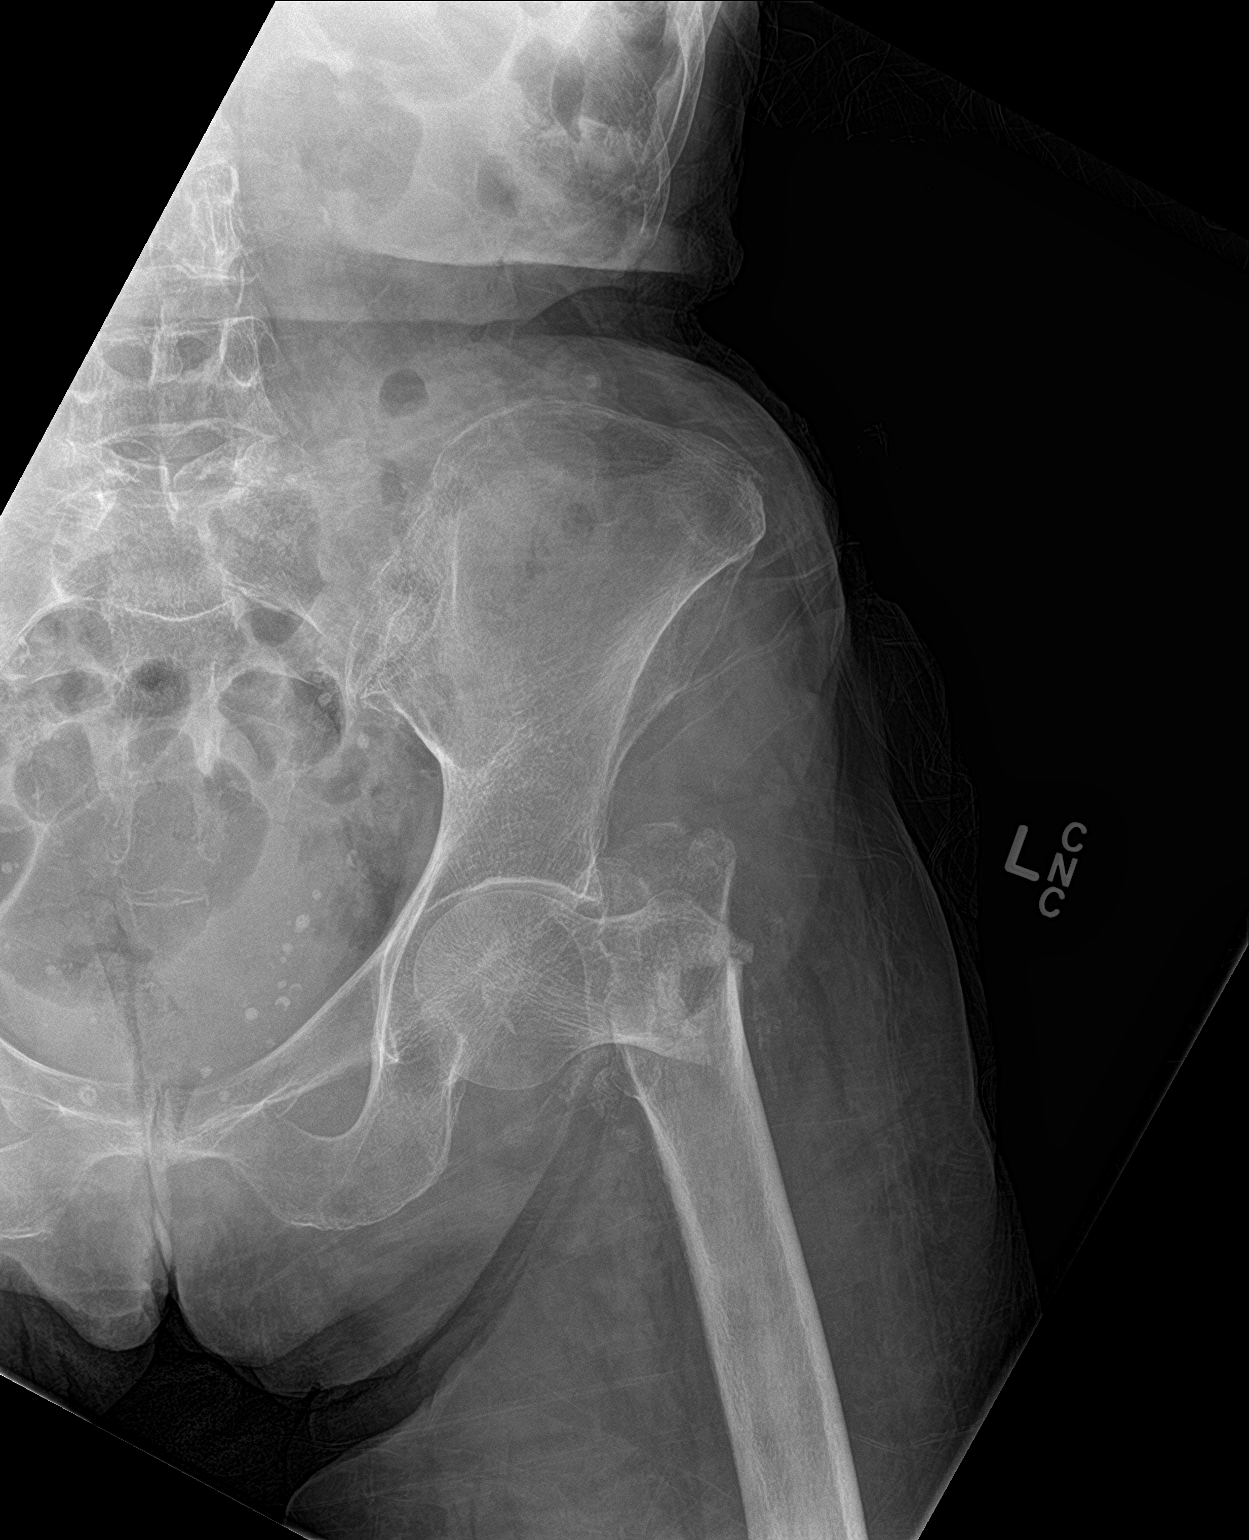

[3 of 3 positions shown; findings below may reference images not displayed]

FINDINGS: AP pelvis shows rightward rotation. There is diffuse bony
demineralization. AP and frog-leg lateral views of the right hip
show a complex femoral neck fracture with varus angulation. Fracture
line in the proximal component containing the femoral head is curved
and appears to have some remodeling along the fracture line.
IMPRESSION: 1. Left femoral neck fracture with varus angulation. Imaging
features are not entirely consistent with acute fracture and this
may be an old fracture with bony remodeling. Pathologic fracture
would also be a consideration.
2. Probable heterotopic ossification in the adjacent soft tissues
suggesting nonacute injury.

## 2019-01-06 IMAGING — CR DG CHEST 1V
1 series · 2 of 2 positions shown · non-contrast
Comparison: None.

CLINICAL DATA: Fall

EXAM:
CHEST 1 VIEW

[Series 1: dg chest 1 view · 0.14mm/px · 2 of 2 slices shown]
[im 1/2]
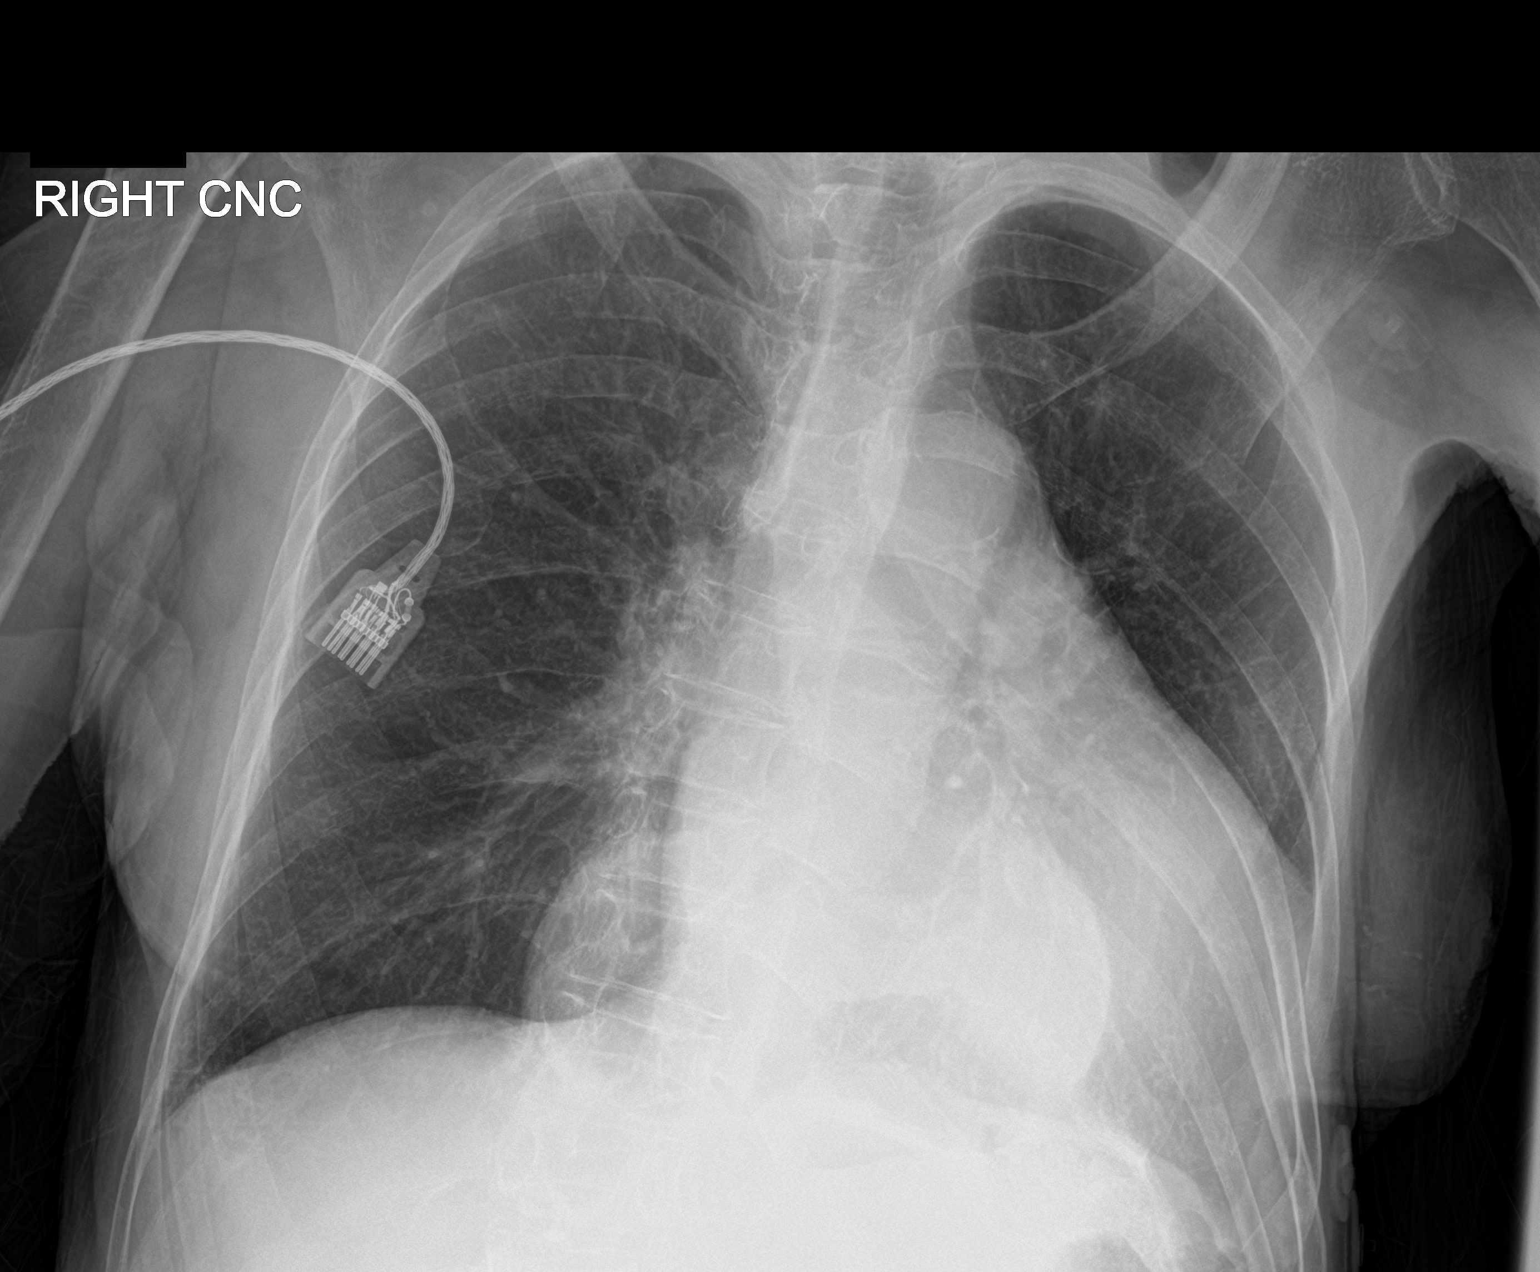
[im 2/2]
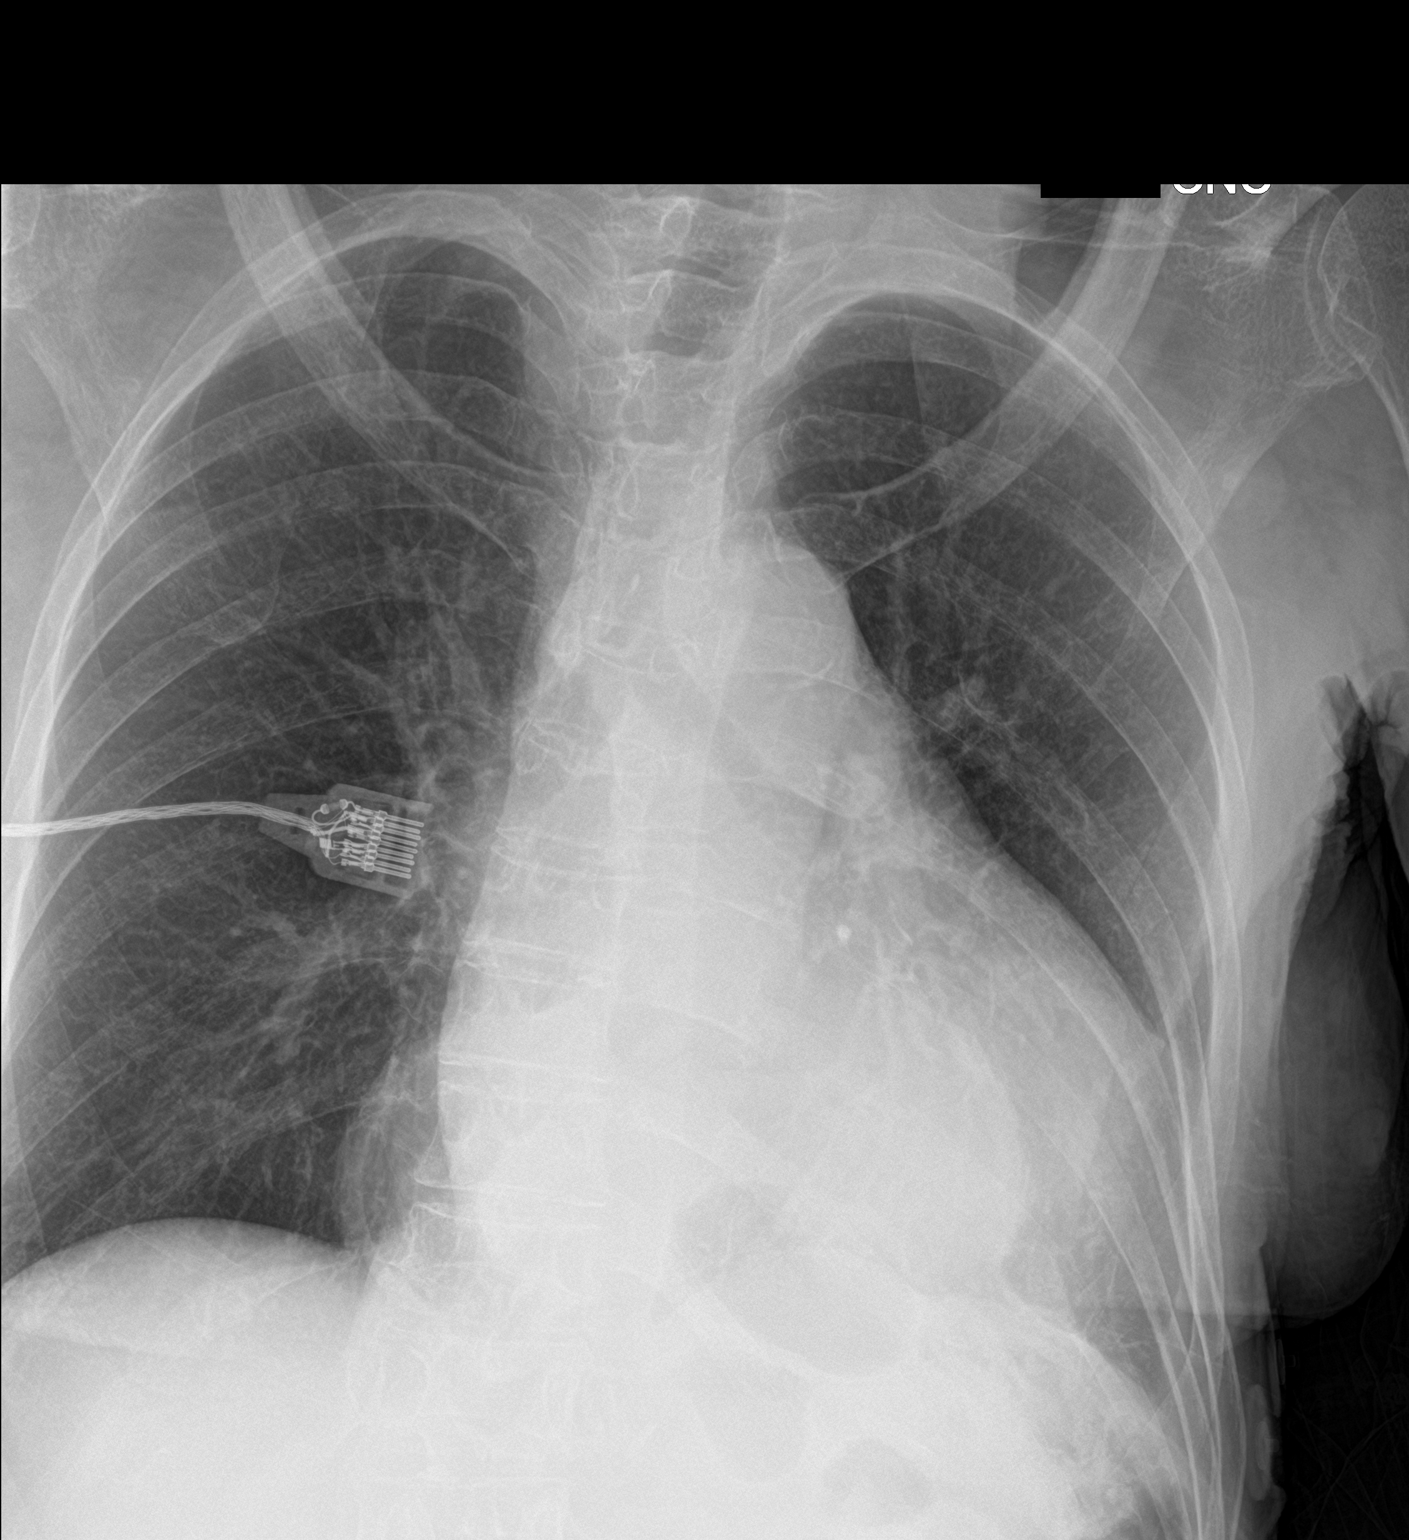

[2 of 2 positions shown; findings below may reference images not displayed]

FINDINGS: Cardiomegaly without pulmonary edema. No focal airspace
consolidation. There is retrocardiac opacity crossing the midline,
suspected to be a hiatal hernia, new from prior studies.
IMPRESSION: Cardiomegaly with retrocardiac opacities suspected to be a hiatal
hernia. The retrocardiac opacity is new from prior studies. A
lateral chest radiograph would be helpful for confirmation.

## 2019-03-01 IMAGING — DX DG CHEST 1V PORT
1 series · 1 of 1 positions shown · non-contrast
Comparison: Single-view of the chest 02/05/2017.

CLINICAL DATA: Altered mental status.

EXAM:
PORTABLE CHEST 1 VIEW

[chest ap]
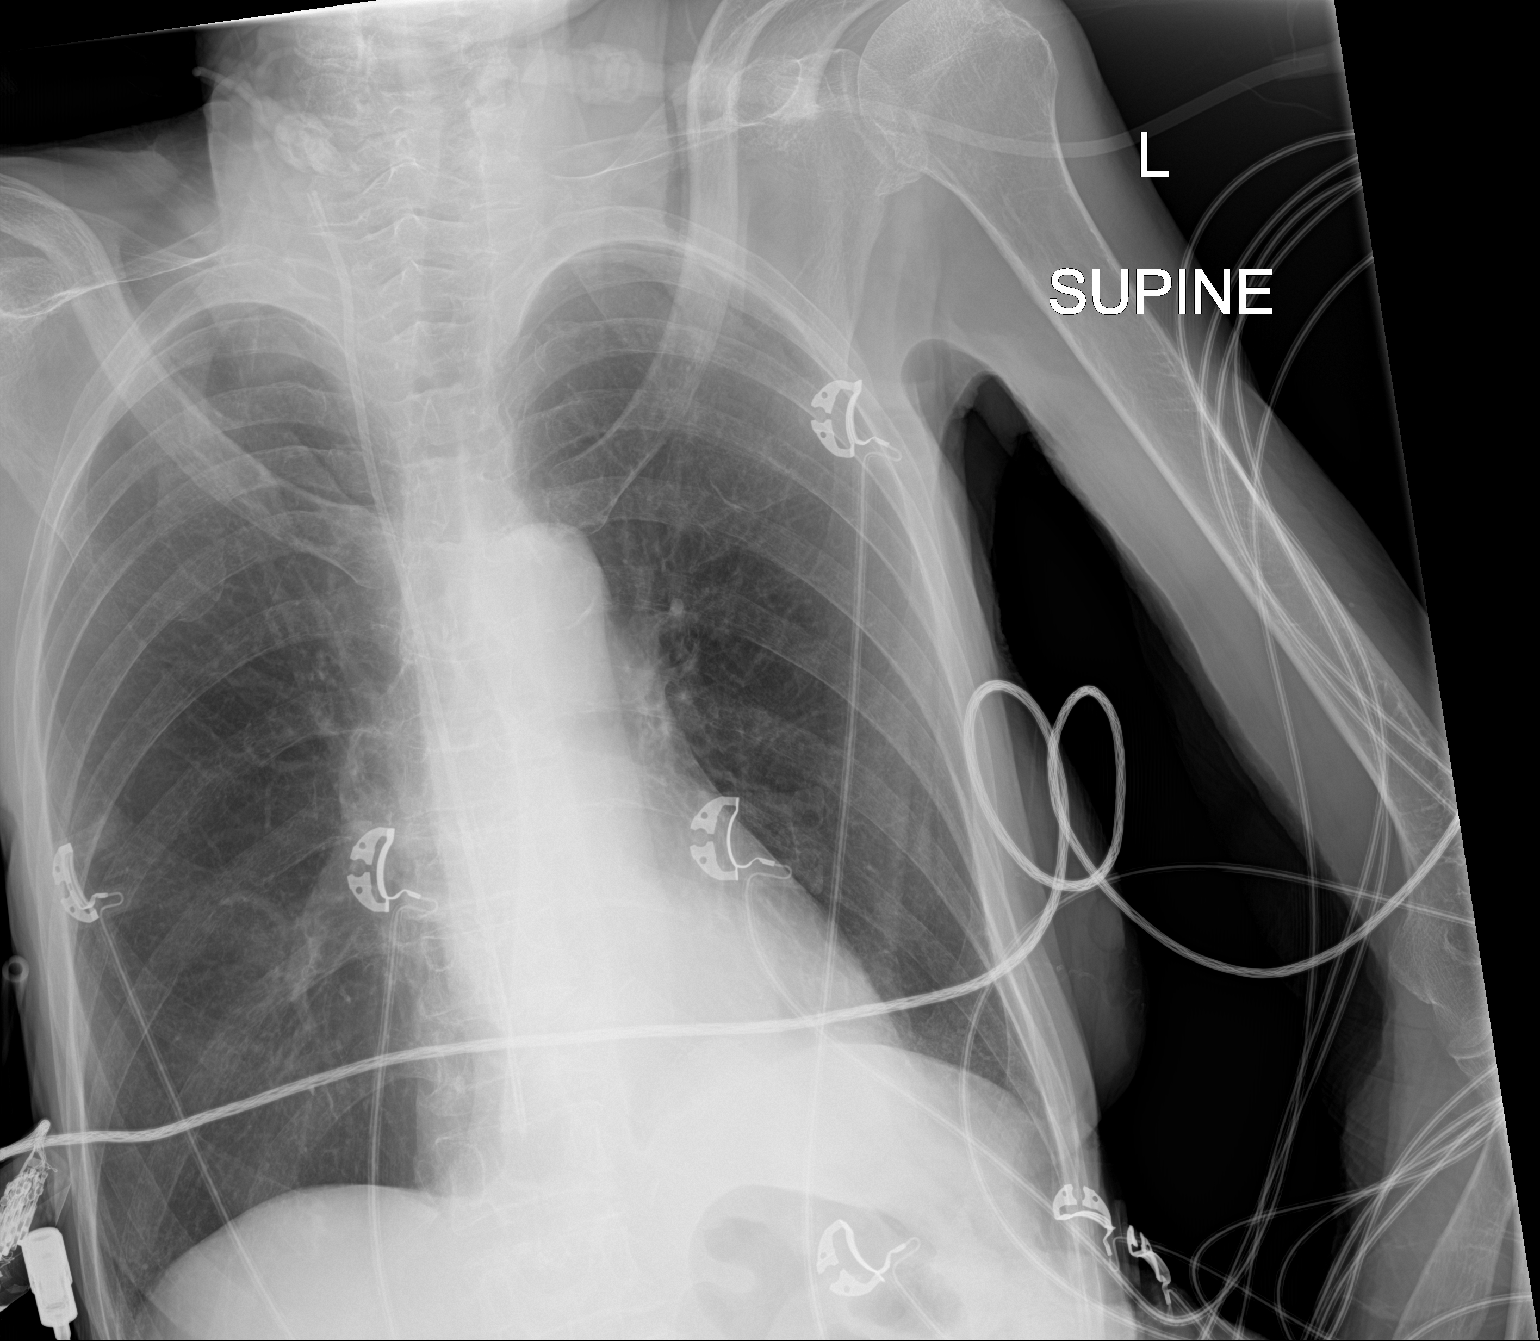

[1 of 1 positions shown; findings below may reference images not displayed]

FINDINGS: New right IJ approach central venous catheter tip projects in the
right atrium. The catheter should be withdrawn approximately 6 cm.
No pneumothorax. Lungs are clear. Heart size is normal. No pleural
fluid. No acute bony abnormality.
IMPRESSION: Right IJ catheter tip projects within the right atrium. Recommend
withdrawal of 6 cm. No pneumothorax.

Lungs clear.

## 2019-03-02 IMAGING — RF DG NASO G TUBE PLC W/FL W/RAD
1 series · 1 of 1 positions shown · IV contrast (agent unspecified)
Comparison: None in PACs

CLINICAL DATA: Decreased oral intake, mental status change, weight
loss.

EXAM:
NASO G TUBE PLACEMENT WITH FL AND WITH RAD
CONTRAST:  20 cc of dilute thin barium.
FLUOROSCOPY TIME:  Fluoroscopy Time:  2 minutes, 36 seconds
Radiation Exposure Index (if provided by the fluoroscopic device):
620 micro Gy per meters square
Number of Acquired Spot Images: 1 screen save

[Series 1: cp_standard · 0.18mm/px · 1 of 1 slices shown]
[im 1/1]
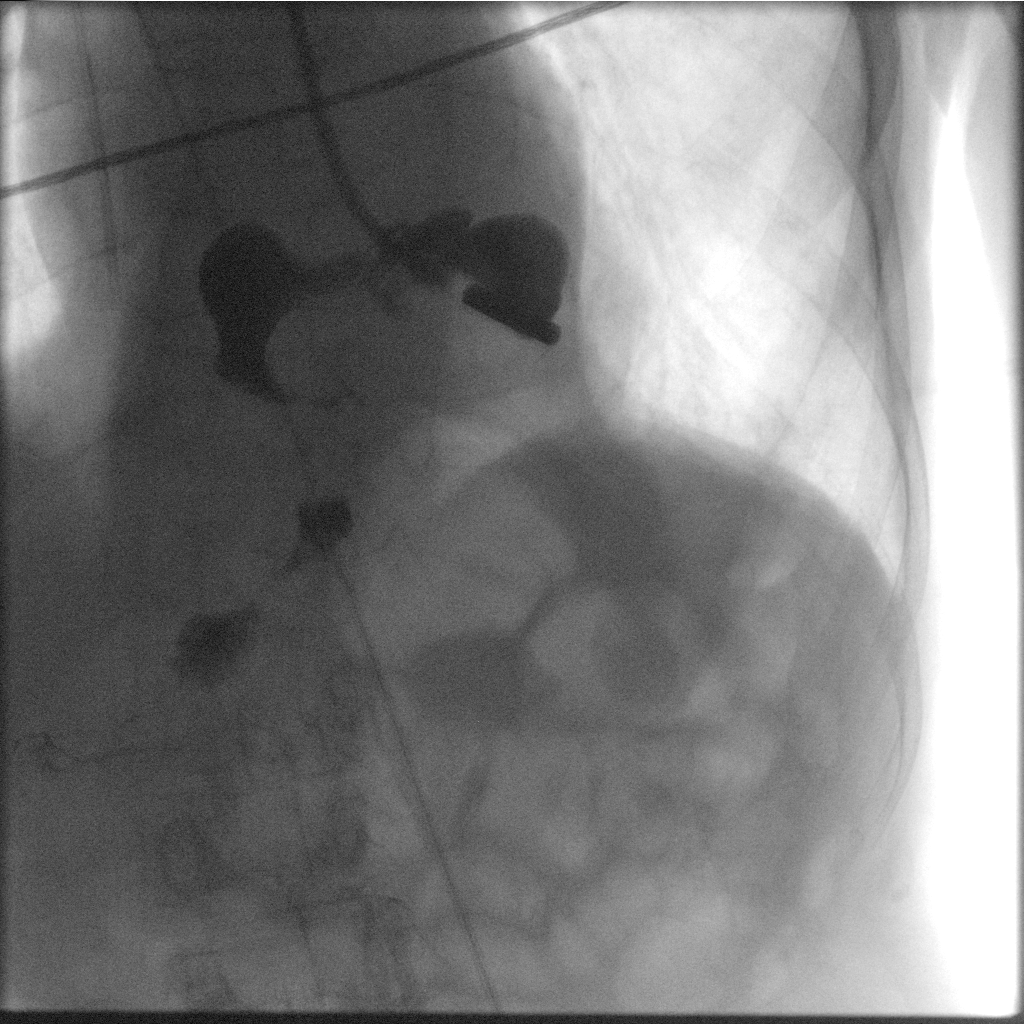

[1 of 1 positions shown; findings below may reference images not displayed]

FINDINGS: The patient was on contact isolation and the 2 Radiology
technologist and myself were appropriately attired for this level of
isolation. The patient was not alert or oriented during the study.
The Dobhoff tube and guidewire were hydrated and the tip lubricated
with lubricating Alisha. The tube was passed through the left nostril
without difficulty and into the esophagus without difficulty.
However in the distal esophagus the tube re-curved upon itself and
would not pass further. A small amount of dilute barium was then
administered which revealed a markedly narrowed distal esophagus
with only tiny amounts of the barium passing into the stomach. The
tube tip could not be directed through this area of narrowing. The
tube was then removed and the procedure terminated.
IMPRESSION: Distal esophageal stricture exhibiting irregularity and preventing
passage of the Dobhoff tube into the stomach. The findings are
worrisome for distal esophageal malignancy though severe
inflammation could produce similar findings given the history of
gastroesophageal reflux.

Direct visualization is recommended to further evaluate the distal
esophagus.
